# Patient Record
Sex: Female | Born: 1950
Health system: Southern US, Community
[De-identification: ages and names within clinical notes are randomized; demographics above are authoritative.]

## PROBLEM LIST (undated history)

## (undated) DIAGNOSIS — K449 Diaphragmatic hernia without obstruction or gangrene: Secondary | ICD-10-CM

## (undated) DIAGNOSIS — Z8742 Personal history of other diseases of the female genital tract: Secondary | ICD-10-CM

## (undated) DIAGNOSIS — E785 Hyperlipidemia, unspecified: Secondary | ICD-10-CM

## (undated) DIAGNOSIS — M810 Age-related osteoporosis without current pathological fracture: Secondary | ICD-10-CM

## (undated) DIAGNOSIS — Z9889 Other specified postprocedural states: Secondary | ICD-10-CM

## (undated) DIAGNOSIS — K649 Unspecified hemorrhoids: Secondary | ICD-10-CM

## (undated) DIAGNOSIS — N8110 Cystocele, unspecified: Secondary | ICD-10-CM

## (undated) DIAGNOSIS — G9332 Myalgic encephalomyelitis/chronic fatigue syndrome: Secondary | ICD-10-CM

## (undated) DIAGNOSIS — R350 Frequency of micturition: Secondary | ICD-10-CM

## (undated) DIAGNOSIS — Z973 Presence of spectacles and contact lenses: Secondary | ICD-10-CM

## (undated) DIAGNOSIS — M8580 Other specified disorders of bone density and structure, unspecified site: Secondary | ICD-10-CM

## (undated) DIAGNOSIS — R5382 Chronic fatigue, unspecified: Secondary | ICD-10-CM

## (undated) DIAGNOSIS — Z8719 Personal history of other diseases of the digestive system: Secondary | ICD-10-CM

## (undated) HISTORY — DX: Other specified postprocedural states: Z98.890

## (undated) HISTORY — DX: Age-related osteoporosis without current pathological fracture: M81.0

## (undated) HISTORY — PX: DILATION AND CURETTAGE OF UTERUS: SHX78

## (undated) HISTORY — PX: CERVICAL POLYPECTOMY: SHX88

## (undated) HISTORY — DX: Other specified disorders of bone density and structure, unspecified site: M85.80

## (undated) HISTORY — PX: UNILATERAL SALPINGECTOMY: SHX6160

## (undated) HISTORY — DX: Personal history of other diseases of the female genital tract: Z87.42

## (undated) HISTORY — DX: Chronic fatigue, unspecified: R53.82

## (undated) HISTORY — DX: Hyperlipidemia, unspecified: E78.5

## (undated) HISTORY — PX: ABDOMINAL HYSTERECTOMY: SHX81

## (undated) HISTORY — DX: Myalgic encephalomyelitis/chronic fatigue syndrome: G93.32

---

## 1989-07-25 HISTORY — PX: TOTAL ABDOMINAL HYSTERECTOMY: SHX209

## 1998-12-16 ENCOUNTER — Other Ambulatory Visit: Admission: RE | Admit: 1998-12-16 | Discharge: 1998-12-16 | Payer: Self-pay | Admitting: Family Medicine

## 2002-09-16 ENCOUNTER — Other Ambulatory Visit: Admission: RE | Admit: 2002-09-16 | Discharge: 2002-09-16 | Payer: Self-pay | Admitting: Family Medicine

## 2003-10-24 LAB — HM COLONOSCOPY: HM Colonoscopy: ABNORMAL

## 2004-07-16 ENCOUNTER — Ambulatory Visit: Payer: Self-pay | Admitting: Family Medicine

## 2004-08-04 ENCOUNTER — Ambulatory Visit: Payer: Self-pay | Admitting: Family Medicine

## 2004-08-18 ENCOUNTER — Ambulatory Visit: Payer: Self-pay | Admitting: Family Medicine

## 2004-10-05 ENCOUNTER — Ambulatory Visit: Payer: Self-pay | Admitting: Family Medicine

## 2004-10-05 ENCOUNTER — Other Ambulatory Visit: Admission: RE | Admit: 2004-10-05 | Discharge: 2004-10-05 | Payer: Self-pay | Admitting: Family Medicine

## 2004-12-01 ENCOUNTER — Ambulatory Visit: Payer: Self-pay | Admitting: Family Medicine

## 2004-12-09 ENCOUNTER — Ambulatory Visit: Payer: Self-pay | Admitting: Family Medicine

## 2005-01-05 ENCOUNTER — Ambulatory Visit: Payer: Self-pay | Admitting: Family Medicine

## 2005-07-07 ENCOUNTER — Ambulatory Visit: Payer: Self-pay | Admitting: Family Medicine

## 2005-09-05 LAB — FECAL OCCULT BLOOD, GUAIAC: Fecal Occult Blood: NEGATIVE

## 2006-01-12 ENCOUNTER — Other Ambulatory Visit: Admission: RE | Admit: 2006-01-12 | Discharge: 2006-01-12 | Payer: Self-pay | Admitting: Family Medicine

## 2006-01-12 ENCOUNTER — Encounter: Payer: Self-pay | Admitting: Family Medicine

## 2006-01-12 ENCOUNTER — Ambulatory Visit: Payer: Self-pay | Admitting: Family Medicine

## 2006-01-12 LAB — CONVERTED CEMR LAB: Pap Smear: NORMAL

## 2006-01-17 ENCOUNTER — Ambulatory Visit: Payer: Self-pay | Admitting: Family Medicine

## 2006-01-24 ENCOUNTER — Ambulatory Visit: Payer: Self-pay | Admitting: Family Medicine

## 2006-01-31 ENCOUNTER — Ambulatory Visit: Payer: Self-pay | Admitting: Family Medicine

## 2006-04-19 ENCOUNTER — Ambulatory Visit: Payer: Self-pay

## 2006-07-14 ENCOUNTER — Ambulatory Visit: Payer: Self-pay | Admitting: Family Medicine

## 2007-01-04 ENCOUNTER — Encounter: Payer: Self-pay | Admitting: Family Medicine

## 2007-01-04 DIAGNOSIS — H539 Unspecified visual disturbance: Secondary | ICD-10-CM | POA: Insufficient documentation

## 2007-01-04 DIAGNOSIS — G9332 Myalgic encephalomyelitis/chronic fatigue syndrome: Secondary | ICD-10-CM | POA: Insufficient documentation

## 2007-01-04 DIAGNOSIS — R5382 Chronic fatigue, unspecified: Secondary | ICD-10-CM

## 2007-01-04 DIAGNOSIS — N952 Postmenopausal atrophic vaginitis: Secondary | ICD-10-CM | POA: Insufficient documentation

## 2007-01-04 DIAGNOSIS — M4120 Other idiopathic scoliosis, site unspecified: Secondary | ICD-10-CM | POA: Insufficient documentation

## 2007-01-04 DIAGNOSIS — E785 Hyperlipidemia, unspecified: Secondary | ICD-10-CM | POA: Insufficient documentation

## 2007-01-09 ENCOUNTER — Ambulatory Visit: Payer: Self-pay | Admitting: Family Medicine

## 2007-01-09 DIAGNOSIS — M8580 Other specified disorders of bone density and structure, unspecified site: Secondary | ICD-10-CM

## 2007-01-09 DIAGNOSIS — M810 Age-related osteoporosis without current pathological fracture: Secondary | ICD-10-CM | POA: Insufficient documentation

## 2007-01-10 LAB — CONVERTED CEMR LAB
Albumin: 4 g/dL (ref 3.5–5.2)
Basophils Absolute: 0 10*3/uL (ref 0.0–0.1)
Bilirubin, Direct: 0.1 mg/dL (ref 0.0–0.3)
Calcium: 9.3 mg/dL (ref 8.4–10.5)
Cholesterol: 175 mg/dL (ref 0–200)
Eosinophils Absolute: 0.1 10*3/uL (ref 0.0–0.6)
Eosinophils Relative: 1.1 % (ref 0.0–5.0)
GFR calc Af Amer: 133 mL/min
GFR calc non Af Amer: 110 mL/min
Glucose, Bld: 87 mg/dL (ref 70–99)
Lymphocytes Relative: 38.6 % (ref 12.0–46.0)
MCHC: 34.2 g/dL (ref 30.0–36.0)
MCV: 91.5 fL (ref 78.0–100.0)
Neutro Abs: 2.9 10*3/uL (ref 1.4–7.7)
Neutrophils Relative %: 52.1 % (ref 43.0–77.0)
Platelets: 201 10*3/uL (ref 150–400)
RBC: 4.35 M/uL (ref 3.87–5.11)
Sodium: 143 meq/L (ref 135–145)
TSH: 1.81 microintl units/mL (ref 0.35–5.50)
Total CHOL/HDL Ratio: 3
Triglycerides: 64 mg/dL (ref 0–149)
WBC: 5.5 10*3/uL (ref 4.5–10.5)

## 2007-01-23 ENCOUNTER — Ambulatory Visit: Payer: Self-pay | Admitting: Family Medicine

## 2007-01-23 ENCOUNTER — Encounter: Payer: Self-pay | Admitting: Family Medicine

## 2007-01-29 ENCOUNTER — Encounter (INDEPENDENT_AMBULATORY_CARE_PROVIDER_SITE_OTHER): Payer: Self-pay | Admitting: *Deleted

## 2007-01-31 ENCOUNTER — Telehealth (INDEPENDENT_AMBULATORY_CARE_PROVIDER_SITE_OTHER): Payer: Self-pay | Admitting: *Deleted

## 2007-03-16 ENCOUNTER — Telehealth (INDEPENDENT_AMBULATORY_CARE_PROVIDER_SITE_OTHER): Payer: Self-pay | Admitting: *Deleted

## 2007-03-16 ENCOUNTER — Ambulatory Visit: Payer: Self-pay | Admitting: Family Medicine

## 2007-03-16 DIAGNOSIS — R319 Hematuria, unspecified: Secondary | ICD-10-CM | POA: Insufficient documentation

## 2007-03-16 LAB — CONVERTED CEMR LAB
Glucose, Urine, Semiquant: NEGATIVE
Ketones, urine, test strip: NEGATIVE
Urobilinogen, UA: 0.2
pH: 5

## 2007-03-17 ENCOUNTER — Encounter: Payer: Self-pay | Admitting: Family Medicine

## 2007-07-11 ENCOUNTER — Ambulatory Visit: Payer: Self-pay | Admitting: Family Medicine

## 2007-07-17 LAB — CONVERTED CEMR LAB
Cholesterol: 181 mg/dL (ref 0–200)
HDL: 56.7 mg/dL (ref >39.0)

## 2007-11-27 ENCOUNTER — Telehealth: Payer: Self-pay | Admitting: Family Medicine

## 2008-01-29 ENCOUNTER — Ambulatory Visit: Payer: Self-pay | Admitting: Family Medicine

## 2008-01-31 LAB — CONVERTED CEMR LAB
ALT: 26 units/L (ref 0–35)
Basophils Absolute: 0.1 10*3/uL (ref 0.0–0.1)
Basophils Relative: 1.4 % — ABNORMAL HIGH (ref 0.0–1.0)
Bilirubin, Direct: 0.1 mg/dL (ref 0.0–0.3)
CO2: 30 meq/L (ref 19–32)
Calcium: 9.3 mg/dL (ref 8.4–10.5)
Creatinine, Ser: 0.7 mg/dL (ref 0.4–1.2)
GFR calc Af Amer: 111 mL/min
Glucose, Bld: 84 mg/dL (ref 70–99)
HCT: 41.3 % (ref 36.0–46.0)
Hemoglobin: 13.8 g/dL (ref 12.0–15.0)
MCHC: 33.4 g/dL (ref 30.0–36.0)
Monocytes Absolute: 0.5 10*3/uL (ref 0.1–1.0)
Monocytes Relative: 8.6 % (ref 3.0–12.0)
Neutro Abs: 2.7 10*3/uL (ref 1.4–7.7)
RBC: 4.4 M/uL (ref 3.87–5.11)
RDW: 12.4 % (ref 11.5–14.6)
Sodium: 140 meq/L (ref 135–145)
TSH: 1.48 microintl units/mL (ref 0.35–5.50)
Total Protein: 6.7 g/dL (ref 6.0–8.3)
Vit D, 1,25-Dihydroxy: 26 — ABNORMAL LOW (ref 30–89)

## 2008-02-04 ENCOUNTER — Ambulatory Visit: Payer: Self-pay | Admitting: Family Medicine

## 2008-02-04 ENCOUNTER — Encounter: Payer: Self-pay | Admitting: Family Medicine

## 2008-02-23 LAB — HM DEXA SCAN: HM Dexa Scan: DECREASED

## 2008-03-18 ENCOUNTER — Encounter: Payer: Self-pay | Admitting: Family Medicine

## 2008-04-14 ENCOUNTER — Ambulatory Visit: Payer: Self-pay | Admitting: Family Medicine

## 2008-04-14 DIAGNOSIS — E559 Vitamin D deficiency, unspecified: Secondary | ICD-10-CM | POA: Insufficient documentation

## 2008-04-21 LAB — CONVERTED CEMR LAB: Vit D, 1,25-Dihydroxy: 67 (ref 30–89)

## 2008-08-04 ENCOUNTER — Telehealth (INDEPENDENT_AMBULATORY_CARE_PROVIDER_SITE_OTHER): Payer: Self-pay | Admitting: *Deleted

## 2008-10-08 ENCOUNTER — Ambulatory Visit: Payer: Self-pay | Admitting: Family Medicine

## 2008-10-09 LAB — CONVERTED CEMR LAB
ALT: 20 units/L (ref 0–35)
AST: 27 units/L (ref 0–37)
HDL: 58.5 mg/dL (ref >39.00)
Total CHOL/HDL Ratio: 3

## 2008-10-13 ENCOUNTER — Ambulatory Visit: Payer: Self-pay | Admitting: Family Medicine

## 2008-10-13 DIAGNOSIS — N760 Acute vaginitis: Secondary | ICD-10-CM | POA: Insufficient documentation

## 2008-10-13 LAB — CONVERTED CEMR LAB
KOH Prep: NEGATIVE
Whiff Test: NEGATIVE

## 2009-01-29 ENCOUNTER — Ambulatory Visit: Payer: Self-pay | Admitting: Family Medicine

## 2009-02-03 LAB — CONVERTED CEMR LAB
AST: 26 units/L (ref 0–37)
Albumin: 3.7 g/dL (ref 3.5–5.2)
BUN: 10 mg/dL (ref 6–23)
Basophils Absolute: 0 10*3/uL (ref 0.0–0.1)
CO2: 30 meq/L (ref 19–32)
Chloride: 107 meq/L (ref 96–112)
Cholesterol: 169 mg/dL (ref 0–200)
GFR calc non Af Amer: 91.6 mL/min (ref >60)
Glucose, Bld: 84 mg/dL (ref 70–99)
HCT: 38.7 % (ref 36.0–46.0)
Hemoglobin: 13.3 g/dL (ref 12.0–15.0)
Lymphs Abs: 2.4 10*3/uL (ref 0.7–4.0)
MCHC: 34.5 g/dL (ref 30.0–36.0)
MCV: 92.8 fL (ref 78.0–100.0)
Monocytes Absolute: 0.5 10*3/uL (ref 0.1–1.0)
Monocytes Relative: 8.2 % (ref 3.0–12.0)
Neutro Abs: 2.7 10*3/uL (ref 1.4–7.7)
Platelets: 156 10*3/uL (ref 150.0–400.0)
Potassium: 3.8 meq/L (ref 3.5–5.1)
RDW: 12.5 % (ref 11.5–14.6)
Sodium: 143 meq/L (ref 135–145)
TSH: 1.71 microintl units/mL (ref 0.35–5.50)
Vit D, 25-Hydroxy: 41 ng/mL (ref 30–89)

## 2009-02-09 ENCOUNTER — Encounter: Payer: Self-pay | Admitting: Family Medicine

## 2009-02-09 ENCOUNTER — Ambulatory Visit: Payer: Self-pay | Admitting: Family Medicine

## 2009-02-11 ENCOUNTER — Encounter (INDEPENDENT_AMBULATORY_CARE_PROVIDER_SITE_OTHER): Payer: Self-pay | Admitting: *Deleted

## 2009-03-16 ENCOUNTER — Encounter: Payer: Self-pay | Admitting: Family Medicine

## 2009-06-01 ENCOUNTER — Telehealth: Payer: Self-pay | Admitting: Family Medicine

## 2010-01-29 ENCOUNTER — Telehealth (INDEPENDENT_AMBULATORY_CARE_PROVIDER_SITE_OTHER): Payer: Self-pay | Admitting: *Deleted

## 2010-01-29 ENCOUNTER — Ambulatory Visit: Payer: Self-pay | Admitting: Family Medicine

## 2010-01-31 LAB — CONVERTED CEMR LAB
ALT: 23 units/L (ref 0–35)
Albumin: 3.9 g/dL (ref 3.5–5.2)
Basophils Relative: 0.7 % (ref 0.0–3.0)
CO2: 29 meq/L (ref 19–32)
Chloride: 110 meq/L (ref 96–112)
Eosinophils Absolute: 0.1 10*3/uL (ref 0.0–0.7)
Glucose, Bld: 80 mg/dL (ref 70–99)
Hemoglobin: 13.4 g/dL (ref 12.0–15.0)
Lymphs Abs: 2.4 10*3/uL (ref 0.7–4.0)
MCHC: 34.7 g/dL (ref 30.0–36.0)
MCV: 94.3 fL (ref 78.0–100.0)
Monocytes Absolute: 0.5 10*3/uL (ref 0.1–1.0)
Neutro Abs: 2.9 10*3/uL (ref 1.4–7.7)
RBC: 4.09 M/uL (ref 3.87–5.11)
Sodium: 145 meq/L (ref 135–145)
Total CHOL/HDL Ratio: 3
Total Protein: 6.3 g/dL (ref 6.0–8.3)
Triglycerides: 60 mg/dL (ref 0.0–149.0)
Vit D, 25-Hydroxy: 51 ng/mL (ref 30–89)

## 2010-02-05 ENCOUNTER — Ambulatory Visit: Payer: Self-pay | Admitting: Family Medicine

## 2010-02-18 ENCOUNTER — Encounter: Payer: Self-pay | Admitting: Family Medicine

## 2010-02-18 ENCOUNTER — Ambulatory Visit: Payer: Self-pay | Admitting: Family Medicine

## 2010-02-18 LAB — HM MAMMOGRAPHY: HM Mammogram: NORMAL

## 2010-02-22 ENCOUNTER — Encounter: Payer: Self-pay | Admitting: Family Medicine

## 2010-02-22 ENCOUNTER — Encounter (INDEPENDENT_AMBULATORY_CARE_PROVIDER_SITE_OTHER): Payer: Self-pay | Admitting: *Deleted

## 2010-04-02 ENCOUNTER — Encounter: Payer: Self-pay | Admitting: Family Medicine

## 2010-05-03 ENCOUNTER — Telehealth: Payer: Self-pay | Admitting: Family Medicine

## 2010-08-12 ENCOUNTER — Ambulatory Visit
Admission: RE | Admit: 2010-08-12 | Discharge: 2010-08-12 | Payer: Self-pay | Source: Home / Self Care | Attending: Family Medicine | Admitting: Family Medicine

## 2010-08-12 DIAGNOSIS — J029 Acute pharyngitis, unspecified: Secondary | ICD-10-CM | POA: Insufficient documentation

## 2010-08-12 LAB — CONVERTED CEMR LAB: Rapid Strep: NEGATIVE

## 2010-08-26 NOTE — Miscellaneous (Signed)
  Clinical Lists Changes  Observations: Added new observation of MAMMO DUE: 02/19/2011 (02/18/2010 12:50) Added new observation of MAMMOGRAM: Normal (02/18/2010 12:50)

## 2010-08-26 NOTE — Progress Notes (Signed)
Summary: please review dexa results  Phone Note Call from Patient Call back at Home Phone [phone]   Caller: Patient Call For: Judith Part MD Summary of Call: Please review bone density report and advise. Initial call taken by: Lowella Petties CMA,  May 03, 2010 10:25 AM  Follow-up for Phone Call        she still has osteopenia - is stable at spine and mildly improved at her femoral neck(hip)  recommendation is to continue with current tx and exercise and re check in 2 years Follow-up by: Judith Part MD,  May 03, 2010 1:39 PM  Additional Follow-up for Phone Call Additional follow up Details #1::        Patient notified as instructed by telephone. Lewanda Rife LPN  May 03, 2010 5:05 PM

## 2010-08-26 NOTE — Assessment & Plan Note (Signed)
Summary: CPX/MK   Vital Signs:  Patient profile:   60 year old female Height:      63.25 inches Weight:      121.25 pounds BMI:     21.39 Temp:     97.8 degrees F oral Pulse rate:   64 / minute Pulse rhythm:   regular BP sitting:   100 / 64  (left arm) Cuff size:   regular  Vitals Entered By: Lewanda Rife LPN (February 05, 2010 9:35 AM) CC: CPX LMP Hyst 2007   History of Present Illness: here for health mt exam and to rev chronic health probls  is doing good overall- no new complaints    lipids are good with HDL 58 and LDL 106- fairly stable- on zocor not walking as much  is taking care of her grandchild -- does push a stroller   wt and bp are stable   Td 08  dexa was 09 osteopenia - 2 y due next mo ca and D- taking it compliantly  evista  pap 07- had hyst for bleeding in past  7/10 mam  self exam - no lumps or changes  is eating a healthy diet     other labs ok   Allergies: 1)  Motrin 2)  Amoxicillin 3)  Fosamax 4)  * Ivp Dye  Past History:  Past Medical History: Last updated: 2008/02/21 Hyperlipidemia osteopenia chronic fatigue syndrome  Past Surgical History: Last updated: 03/28/2008 Hysterectomy- 1 ovary removed, bleeding Dexa- osteopenia (12/1997),, worse (12/1998) MRI LS- normal (01/1993) Cervical polypectomy Dexa improved- increased BMD 5.7 % LS, 1-8% hip (03/2001) Colonoscopy- int hemorrhoids (10/2003) EGD- HH, duodenopathy Urology w/u- hematuria, pos stone (08/2004) Pelvic US- neg (09/2004) Dexa- decreased BMD, osteopenia (02/2006) dexa- decreased bmd in hip, slt inc in spine (8/09)  Family History: Last updated: 2008/02/21 Father: Barbaraann Share, HTN- deceased Mother: kidney removed, CVA Siblings: 2 brothers, 1 sister brothers times 2 cholesterol sister cholesterol MGM intestinal ca PGM- cancer ? 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 Eyes:  Denies blurring and eye irritation. CV:  Denies chest pain or discomfort, palpitations, shortness of breath with exertion, and swelling of feet. Resp:  Denies cough, shortness of breath, sputum productive, and wheezing. GI:  Denies abdominal pain, change in bowel habits, indigestion, and nausea. GU:  Denies dysuria and urinary frequency. MS:  Denies joint pain, joint redness, joint swelling, and muscle aches. Derm:  Denies itching, poor wound healing, and rash. Neuro:  Denies tingling. Psych:  Denies anxiety and depression. Endo:  Denies cold intolerance, excessive thirst, excessive urination, and heat intolerance. Heme:  Denies abnormal bruising and bleeding.  Physical Exam  General:  Well-developed,well-nourished,in no acute distress; alert,appropriate and cooperative throughout examination Head:  normocephalic, atraumatic, and no abnormalities observed.   Eyes:  vision grossly intact, pupils equal, pupils round, and pupils reactive to light.  no conjunctival pallor, injection or icterus  Nose:  no nasal discharge.   Mouth:  pharynx pink and moist.   Neck:  supple with full rom and no masses or thyromegally, no JVD or carotid bruit  Chest Wall:  No deformities, masses, or tenderness noted. Breasts:  No mass, nodules, thickening, tenderness, bulging, retraction, inflamation, nipple discharge or skin changes noted.   Lungs:  Normal respiratory effort, chest expands symmetrically. Lungs are clear to auscultation, no crackles or wheezes. Heart:  Normal rate and regular rhythm. S1 and S2 normal without gallop, murmur, click, rub or other extra sounds. Abdomen:  Bowel sounds positive,abdomen soft and non-tender without  masses, organomegaly or hernias noted. no renal bruits  Msk:  No deformity or scoliosis noted of thoracic or lumbar spine.  no acute joint changes  Pulses:  R and L carotid,radial,femoral,dorsalis pedis and posterior tibial pulses are full and equal bilaterally Extremities:  No clubbing, cyanosis, edema, or deformity noted with normal full range of motion of all joints.   Neurologic:  sensation intact to light touch, gait normal, and DTRs symmetrical and normal.   Skin:  Intact without suspicious lesions or rashes some lentigos Cervical Nodes:  No lymphadenopathy noted Axillary Nodes:  No palpable lymphadenopathy Inguinal Nodes:  No significant adenopathy Psych:  normal affect, talkative and pleasant    Impression & Recommendations:  Problem # 1:  HEALTH MAINTENANCE EXAM (ICD-V70.0) Assessment Comment Only  reviewed health habits including diet, exercise and skin cancer prevention reviewed health maintenance list and family history  rev labs in detail enc good health habits   Orders: Prescription Created Electronically [phone])  Problem # 2:  NEOPLASM, MALIGNANT, COLON, FAMILY HX (ICD-V16.0)  is up to date on colonosc   Orders: Prescription Created Electronically [id number])  Problem # 3:  UNSPECIFIED VITAMIN D DEFICIENCY (ICD-268.9) Assessment: Improved  this is back in normal range with regular dosing - reviewed that   Orders: Prescription Created Electronically [phone])  Problem # 4:  OTHER SCREENING MAMMOGRAM (ICD-V76.12) Assessment: Comment Only annual mammogram scheduled adv pt to continue regular self breast exams non remarkable breast exam today  Orders: Radiology Referral (Radiology) Prescription Created Electronically [phone])  Problem # 5:  HYPERLIPIDEMIA (ICD-272.4) Assessment: Unchanged  lipids continue to be well controlled on statin and good diet rev labs today rev low sat fat diet  Her updated medication list for this problem includes:    Zocor 40 Mg  Tabs (Simvastatin) .Marland Kitchen... 1 by mouth once daily  Labs Reviewed: SGOT: 30 (01/29/2010)   SGPT: 23 (01/29/2010)   HDL:58.70 (01/29/2010), 53.60 (01/29/2009)  LDL:106 (01/29/2010), 96 (30/86/5784)  Chol:177 (01/29/2010), 169 (01/29/2009)  Trig:60.0 (01/29/2010), 95.0 (01/29/2009)  Orders: Prescription Created Electronically [phone])  Complete Medication List: 1)  Caltrate 600 Tabs (Calcium carbonate tabs) .... Take two by mouth daily 2)  Centrum Tabs (Multiple vitamins-minerals) .... Take as directed daily 3)  Vitamin D 2000 Unit Tabs (Cholecalciferol) .... Take 1 tablet by mouth once a day 4)  Evista 60 Mg Tabs (Raloxifene hcl) .Marland Kitchen.. 1 by mouth once daily 5)  Zocor 40 Mg Tabs (Simvastatin) .Marland Kitchen.. 1 by mouth once daily  Patient Instructions: 1)  labs look good 2)  keep up the healthy diet and exercise  3)  we will schedule dexa and mammogram at check out  Prescriptions: ZOCOR 40 MG TABS (SIMVASTATIN) 1 by mouth once daily  #30 x 11   Entered and Authorized by:   Judith Part MD   Signed by:   Judith Part MD on 02/05/2010   Method used:   Electronically to        [facility] (retail)       [address]  [place], [place]  [address]       Ph: [phone]       Fax: [phone]   RxID:   [id number] EVISTA 60 MG TABS (RALOXIFENE HCL) 1 by mouth once daily  #30 x 11   Entered and Authorized by:   Judith Part MD   Signed by:   Judith Part MD on 02/05/2010   Method used:   Electronically to        [facility] (retail)       [address]       [place], [place]  [address]       Ph: [phone]       Fax: [phone]   RxID:   [id number]   Current Allergies (reviewed today): MOTRIN AMOXICILLIN FOSAMAX * IVP DYE

## 2010-08-26 NOTE — Letter (Signed)
Summary: Results Follow up Letter  [place] [facility]  [address] [place], [place] [address]   Phone: [phone]  Fax: [phone]    02/22/2010 MRN: [id number]    Tracy [name] [address] [place], [place]  [address]    Dear Ms. Shatzer,  The following are the results of your recent test(s):  Test         Result    Pap Smear:        Normal _____  Not Normal _____ Comments: ______________________________________________________ Cholesterol: LDL(Bad cholesterol):         Your goal is less than:         HDL (Good cholesterol):       Your goal is more than: Comments:  ______________________________________________________ Mammogram:        Normal __X___  Not Normal _____ Comments:  Yearly follow up is recommended.   ___________________________________________________________________ Hemoccult:        Normal _____  Not normal _______ Comments:    _____________________________________________________________________ Other Tests:    We routinely do not discuss normal results over the telephone.  If you desire a copy of the results, or you have any questions about this information we can discuss them at your next office visit.   Sincerely,    Marne A. Milinda Antis, M.D.  MAT:lsf

## 2010-08-26 NOTE — Progress Notes (Signed)
----   Converted from flag ---- ---- 01/28/2010 3:56 PM, Colon Flattery Tower MD wrote: please check wellness/ lipid and vit d for v70.0 and vit D def   ---- 01/28/2010 12:24 PM, Mills Koller wrote: Hello,  Pt having cpx labs tomorrow, need order and dx ,Please. Terri ------------------------------

## 2010-08-26 NOTE — Assessment & Plan Note (Signed)
Summary: SORE THROAT/ lb   Vital Signs:  Patient profile:   60 year old female Height:      63.25 inches Weight:      121.25 pounds BMI:     21.39 Temp:     98.7 degrees F oral Pulse rate:   82 / minute Pulse rhythm:   regular BP sitting:   102 / 70  (left arm) Cuff size:   regular  Vitals Entered By: Linde Gillis CMA Duncan Dull) (August 12, 2010 10:51 AM) CC: sore throat, vomiting, loose stool   History of Present Illness: 60 yo here for sore throat, vomiting.  Woke up yesterday with very sore throat. No runny nose, ear pressure.  Did have some phelgm in her throat and when she coughed it up she vomitted some mucousy emesis. No recurrent epidsodes of vomiting. Stools were just a little loose this morning.  Felt feverish last night. Tmax this morning was 99.6  Takes care of her 29 month old grandaughter who did have an ear infection this week, on abx.  No rash that she is aware of. Not currently nauseated.   Current Medications (verified): 1)  Caltrate 600   Tabs (Calcium Carbonate Tabs) .... Take Two By Mouth Daily 2)  Centrum   Tabs (Multiple Vitamins-Minerals) .... Take As Directed Daily 3)  Vitamin D 2000 Unit Tabs (Cholecalciferol) .... Take 1 Tablet By Mouth Once A Day 4)  Evista 60 Mg Tabs (Raloxifene Hcl) .Marland Kitchen.. 1 By Mouth Once Daily 5)  Zocor 40 Mg Tabs (Simvastatin) .Marland Kitchen.. 1 By Mouth Once Daily  Allergies: 1)  Motrin 2)  Amoxicillin 3)  Fosamax 4)  * Ivp Dye  Past History:  Past Medical History: Last updated: 02/17/08 Hyperlipidemia osteopenia chronic fatigue syndrome  Past Surgical History: Last updated: 03/28/2008 Hysterectomy- 1 ovary removed, bleeding Dexa- osteopenia (12/1997),, worse (12/1998) MRI LS- normal (01/1993) Cervical polypectomy Dexa improved- increased BMD 5.7 % LS, 1-8% hip (03/2001) Colonoscopy- int hemorrhoids (10/2003) EGD- HH, duodenopathy Urology w/u- hematuria, pos stone (08/2004) Pelvic US- neg (09/2004) Dexa- decreased  BMD, osteopenia (02/2006) dexa- decreased bmd in hip, slt inc in spine (8/09)  Family History: Last updated: 2008/02/17 Father: Barbaraann Share, HTN- deceased Mother: kidney removed, CVA Siblings: 2 brothers, 1 sister brothers times 2 cholesterol sister cholesterol MGM intestinal ca PGM- cancer ? 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 ENT:  Complains of sore throat; denies difficulty swallowing, earache, nasal congestion, and sinus pressure. CV:  Denies chest pain or discomfort. Resp:  Complains of cough; denies shortness of breath and wheezing. GI:  Complains of nausea and vomiting.  Physical Exam  General:  Well-developed,well-nourished,in no acute distress; alert,appropriate and cooperative throughout examination VSS Ears:  R ear normal and L ear normal.   Nose:  no nasal discharge.   Mouth:  pharyngeal erythema.   no exudate.pharyngeal erythema.   Neck:  supple with full rom and no masses or thyromegally, no JVD or carotid bruit  Lungs:  Normal respiratory effort, chest expands symmetrically. Lungs are clear to auscultation, no crackles or wheezes. Heart:  Normal rate and regular rhythm. S1 and S2 normal without gallop,  murmur, click, rub or other extra sounds. Abdomen:  Bowel sounds positive,abdomen soft and non-tender without masses, organomegaly or hernias noted. no renal bruits  Cervical Nodes:  shoddy enlarged, non tender lymph nodes. Psych:  normal affect, talkative and pleasant    Impression & Recommendations:  Problem # 1:  ACUTE PHARYNGITIS (ICD-462) Assessment New Rapid strep negative and symptoms seem most consistent  with viral pharyngitis. Advised supportive care- push fluids, use Ibupfrofen /tylenol for comfort and fever. Vomitting was likely due to sputum as her symptoms have resolved. Orders: Rapid Strep (21308)  Complete Medication List: 1)  Caltrate 600 Tabs (Calcium carbonate tabs) .... Take two by mouth daily 2)  Centrum Tabs (Multiple vitamins-minerals) .... Take as directed daily 3)  Vitamin D 2000 Unit Tabs (Cholecalciferol) .... Take 1 tablet by mouth once a day 4)  Evista 60 Mg Tabs (Raloxifene hcl) .Marland Kitchen.. 1 by mouth once daily 5)  Zocor 40 Mg Tabs (Simvastatin) .Marland Kitchen.. 1 by mouth once daily   Orders Added: 1)  Rapid Strep [[address]] 2)  Est. Patient Level III [[address]]    Current Allergies (reviewed today): MOTRIN AMOXICILLIN FOSAMAX * IVP DYE Laboratory Results    Other Tests  Rapid Strep: negative  Kit Test Internal QC: Positive   (Normal Range: Negative)

## 2011-01-29 ENCOUNTER — Encounter: Payer: Self-pay | Admitting: Family Medicine

## 2011-02-01 ENCOUNTER — Other Ambulatory Visit (INDEPENDENT_AMBULATORY_CARE_PROVIDER_SITE_OTHER): Payer: BC Managed Care – PPO | Admitting: Family Medicine

## 2011-02-01 ENCOUNTER — Telehealth: Payer: Self-pay | Admitting: Family Medicine

## 2011-02-01 DIAGNOSIS — E559 Vitamin D deficiency, unspecified: Secondary | ICD-10-CM

## 2011-02-01 DIAGNOSIS — E785 Hyperlipidemia, unspecified: Secondary | ICD-10-CM

## 2011-02-01 DIAGNOSIS — Z0000 Encounter for general adult medical examination without abnormal findings: Secondary | ICD-10-CM

## 2011-02-01 LAB — COMPREHENSIVE METABOLIC PANEL
ALT: 21 U/L (ref 0–35)
AST: 29 U/L (ref 0–37)
Albumin: 4 g/dL (ref 3.5–5.2)
BUN: 14 mg/dL (ref 6–23)
CO2: 29 mEq/L (ref 19–32)
Calcium: 8.9 mg/dL (ref 8.4–10.5)
Chloride: 110 mEq/L (ref 96–112)
GFR: 99.09 mL/min (ref >60.00)
Potassium: 4.2 mEq/L (ref 3.5–5.1)

## 2011-02-01 LAB — CBC WITH DIFFERENTIAL/PLATELET
Basophils Relative: 0.6 % (ref 0.0–3.0)
Eosinophils Absolute: 0.2 10*3/uL (ref 0.0–0.7)
Lymphocytes Relative: 40.3 % (ref 12.0–46.0)
MCHC: 34 g/dL (ref 30.0–36.0)
Monocytes Relative: 8.8 % (ref 3.0–12.0)
Neutrophils Relative %: 47 % (ref 43.0–77.0)
RBC: 4.24 Mil/uL (ref 3.87–5.11)
WBC: 5.5 10*3/uL (ref 4.5–10.5)

## 2011-02-01 LAB — TSH: TSH: 2.84 u[IU]/mL (ref 0.35–5.50)

## 2011-02-01 LAB — LIPID PANEL
Total CHOL/HDL Ratio: 3
Triglycerides: 78 mg/dL (ref 0.0–149.0)

## 2011-02-01 NOTE — Telephone Encounter (Signed)
Message copied by Judy Pimple on Tue Feb 01, 2011  8:26 AM ------      Message from: Alvina Chou      Created: Mon Jan 31, 2011 10:58 AM       Patient is scheduled for CPX labs, please order future labs, Thanks , Camelia Eng

## 2011-02-03 ENCOUNTER — Other Ambulatory Visit: Payer: Self-pay | Admitting: *Deleted

## 2011-02-03 MED ORDER — RALOXIFENE HCL 60 MG PO TABS: 60.0000 mg | ORAL_TABLET | Freq: Every day | ORAL | Status: DC

## 2011-02-03 MED ORDER — SIMVASTATIN 40 MG PO TABS: 40.0000 mg | ORAL_TABLET | Freq: Every day | ORAL | Status: DC

## 2011-02-07 ENCOUNTER — Encounter: Payer: Self-pay | Admitting: Family Medicine

## 2011-02-07 ENCOUNTER — Ambulatory Visit (INDEPENDENT_AMBULATORY_CARE_PROVIDER_SITE_OTHER): Payer: BC Managed Care – PPO | Admitting: Family Medicine

## 2011-02-07 DIAGNOSIS — M949 Disorder of cartilage, unspecified: Secondary | ICD-10-CM

## 2011-02-07 DIAGNOSIS — E785 Hyperlipidemia, unspecified: Secondary | ICD-10-CM

## 2011-02-07 DIAGNOSIS — Z1231 Encounter for screening mammogram for malignant neoplasm of breast: Secondary | ICD-10-CM

## 2011-02-07 DIAGNOSIS — M899 Disorder of bone, unspecified: Secondary | ICD-10-CM

## 2011-02-07 DIAGNOSIS — E559 Vitamin D deficiency, unspecified: Secondary | ICD-10-CM

## 2011-02-07 DIAGNOSIS — Z0000 Encounter for general adult medical examination without abnormal findings: Secondary | ICD-10-CM

## 2011-02-07 MED ORDER — SIMVASTATIN 40 MG PO TABS: 40.0000 mg | ORAL_TABLET | Freq: Every day | ORAL | Status: DC

## 2011-02-07 MED ORDER — RALOXIFENE HCL 60 MG PO TABS: 60.0000 mg | ORAL_TABLET | Freq: Every day | ORAL | Status: DC

## 2011-02-07 NOTE — Progress Notes (Signed)
Subjective:    Patient ID: Tracy [name], female    DOB: 1950-10-11, 60 y.o.   MRN: [id number]  HPI Here for health mt exam and to rev chronic med problems Wt is stable  Is doing well - overall  No new medical problems   No pap due to hyst for bleeding No gyn issues or symptoms    Mam 7/11 normal  Wants to set that up at [facility]  Self exams- no lumps or changes   colonosc 4.05 hemm Told her 10 years  gmother colon cancer   Td 08 dexa 9/11 osteopenia with FN T score of -2.1 On evista--has been on that 1-2 years - doing well on that  Good vit D level at 64   Lipids great with zocor and diet LDL 91 Lab Results  Component Value Date   CHOL 172 02/01/2011   CHOL 177 01/29/2010   CHOL 169 01/29/2009   Lab Results  Component Value Date   HDL 65.80 02/01/2011   HDL 01.02 01/29/2010   HDL 53.60 01/29/2009   Lab Results  Component Value Date   LDLCALC 91 02/01/2011   LDLCALC 106* 01/29/2010   LDLCALC 96 01/29/2009   Lab Results  Component Value Date   TRIG 78.0 02/01/2011   TRIG 60.0 01/29/2010   TRIG 95.0 01/29/2009   Lab Results  Component Value Date   CHOLHDL 3 02/01/2011   CHOLHDL 3 01/29/2010   CHOLHDL 3 01/29/2009   No results found for this basename: LDLDIRECT   Plans to stay on it - zocor and diet   Patient Active Problem List  Diagnoses  . UNSPECIFIED VITAMIN D DEFICIENCY  . HYPERLIPIDEMIA  . VAGINITIS, ATROPHIC  . OSTEOPENIA  . SCOLIOSIS  . CHRONIC FATIGUE SYNDROME  . Routine general medical examination at a health care facility  . Other screening mammogram   Past Medical History  Diagnosis Date  . Hyperlipidemia   . Osteopenia   . Chronic fatigue syndrome   . S/P cervical polypectomy    Past Surgical History  Procedure Date  . Abdominal hysterectomy     one ovary removed/ bleeding   History  Substance Use Topics  . Smoking status: Never Smoker   . Smokeless tobacco: Not on file  . Alcohol Use: No   Family History  Problem Relation  Age of Onset  . Stroke Mother   . Hypertension Father   . Hyperlipidemia Sister   . Hyperlipidemia Brother   . Cancer Paternal Uncle   . Cancer Maternal Grandmother     intestinal CA  . Cancer Paternal Grandfather     CA unsure which kind  . Hyperlipidemia Brother    Allergies  Allergen Reactions  . Alendronate Sodium     REACTION: GI  . Amoxicillin     REACTION: vomiting  . Ibuprofen     REACTION: rash   Current Outpatient Prescriptions on File Prior to Visit  Medication Sig Dispense Refill  . Calcium Carbonate (CALTRATE 600 PO) Take 2 tablets by mouth daily.        . Cholecalciferol (VITAMIN D) 2000 UNITS CAPS Take 1 capsule by mouth daily.        . Multiple Vitamin (MULTIVITAMIN) tablet Take 1 tablet by mouth daily.              Review of Systems Review of Systems  Constitutional: Negative for fever, appetite change, fatigue and unexpected weight change.  Eyes: Negative for pain and  visual disturbance.  Respiratory: Negative for cough and shortness of breath.   Cardiovascular: Negative.  for cp or palpitations Gastrointestinal: Negative for nausea, diarrhea and constipation.  Genitourinary: Negative for urgency and frequency.  Skin: Negative for pallor. or rash  Neurological: Negative for weakness, light-headedness, numbness and headaches.  Hematological: Negative for adenopathy. Does not bruise/bleed easily.  Psychiatric/Behavioral: Negative for dysphoric mood. The patient is not nervous/anxious.         Objective:   Physical Exam  Constitutional: She appears well-developed and well-nourished. No distress.  HENT:  Head: Normocephalic and atraumatic.  Right Ear: External ear normal.  Left Ear: External ear normal.  Nose: Nose normal.  Mouth/Throat: Oropharynx is clear and moist.  Eyes: Conjunctivae and EOM are normal. Pupils are equal, round, and reactive to light.  Neck: Normal range of motion. Neck supple. No JVD present. Carotid bruit is not present. No  thyromegaly present.  Cardiovascular: Normal rate, regular rhythm, normal heart sounds and intact distal pulses.   Pulmonary/Chest: Effort normal and breath sounds normal. No respiratory distress. She has no wheezes.  Abdominal: Soft. Bowel sounds are normal. She exhibits no distension. There is no tenderness.  Genitourinary: No breast swelling, tenderness, discharge or bleeding.  Musculoskeletal: Normal range of motion. She exhibits no edema and no tenderness.  Lymphadenopathy:    She has no cervical adenopathy.  Neurological: She is alert. She has normal reflexes. Coordination normal.  Skin: Skin is warm and dry. No rash noted. No erythema. No pallor.  Psychiatric: She has a normal mood and affect.          Assessment & Plan:

## 2011-02-07 NOTE — Assessment & Plan Note (Signed)
Very good control with zocor 40 and diet  No problems-so will continue this dose for now Disc low sat fat diet

## 2011-02-07 NOTE — Assessment & Plan Note (Signed)
Mam sched Nl breast exam today Reminded to do self exams also

## 2011-02-07 NOTE — Assessment & Plan Note (Signed)
Reviewed health habits including diet and exercise and skin cancer prevention Also reviewed health mt list, fam hx and immunizations  Rev wellness labs with pt in detail

## 2011-02-07 NOTE — Patient Instructions (Signed)
We will schedule mammogram at check out  Keep up the good work with healthy diet and exercise  No change in medicines

## 2011-02-07 NOTE — Assessment & Plan Note (Signed)
Good level Will continue current vit D dose and exercise

## 2011-02-07 NOTE — Assessment & Plan Note (Signed)
On evista 1-2 years now and doing well No fx Good vit D level  Continue ca and D Also exercise Disc safety

## 2011-02-23 ENCOUNTER — Ambulatory Visit: Payer: Self-pay | Admitting: Family Medicine

## 2011-02-24 ENCOUNTER — Encounter: Payer: Self-pay | Admitting: Family Medicine

## 2011-06-22 ENCOUNTER — Telehealth: Payer: Self-pay | Admitting: *Deleted

## 2011-06-22 NOTE — Telephone Encounter (Signed)
I would recommend allegra or claritin for runny nose, mucinex for congestion and tylenol (acetaminophen) for fever Lots of fluids F/u if not improving

## 2011-06-22 NOTE — Telephone Encounter (Signed)
Patient notified as instructed by telephone. Pt will call back if does not improve.

## 2011-06-22 NOTE — Telephone Encounter (Signed)
Pt c/o low grade fever, and congestion, wants to know what otc meds you would advise her take.

## 2012-02-03 ENCOUNTER — Telehealth (INDEPENDENT_AMBULATORY_CARE_PROVIDER_SITE_OTHER): Payer: BC Managed Care – PPO | Admitting: Family Medicine

## 2012-02-03 ENCOUNTER — Other Ambulatory Visit (INDEPENDENT_AMBULATORY_CARE_PROVIDER_SITE_OTHER): Payer: BC Managed Care – PPO

## 2012-02-03 DIAGNOSIS — E785 Hyperlipidemia, unspecified: Secondary | ICD-10-CM

## 2012-02-03 DIAGNOSIS — Z0000 Encounter for general adult medical examination without abnormal findings: Secondary | ICD-10-CM

## 2012-02-03 DIAGNOSIS — E559 Vitamin D deficiency, unspecified: Secondary | ICD-10-CM

## 2012-02-03 DIAGNOSIS — M899 Disorder of bone, unspecified: Secondary | ICD-10-CM

## 2012-02-03 LAB — CBC WITH DIFFERENTIAL/PLATELET
Basophils Relative: 0.8 % (ref 0.0–3.0)
Eosinophils Absolute: 0.1 10*3/uL (ref 0.0–0.7)
MCHC: 33 g/dL (ref 30.0–36.0)
MCV: 94.7 fl (ref 78.0–100.0)
Monocytes Absolute: 0.6 10*3/uL (ref 0.1–1.0)
Neutrophils Relative %: 46.3 % (ref 43.0–77.0)
RBC: 4.22 Mil/uL (ref 3.87–5.11)

## 2012-02-03 LAB — COMPREHENSIVE METABOLIC PANEL
AST: 28 U/L (ref 0–37)
Albumin: 3.8 g/dL (ref 3.5–5.2)
Alkaline Phosphatase: 39 U/L (ref 39–117)
BUN: 11 mg/dL (ref 6–23)
Creatinine, Ser: 0.6 mg/dL (ref 0.4–1.2)
Potassium: 3.9 mEq/L (ref 3.5–5.1)

## 2012-02-03 LAB — LIPID PANEL
Cholesterol: 169 mg/dL (ref 0–200)
HDL: 66.3 mg/dL (ref >39.00)
VLDL: 16.8 mg/dL (ref 0.0–40.0)

## 2012-02-03 NOTE — Telephone Encounter (Signed)
Message copied by Judy Pimple on Fri Feb 03, 2012  7:50 AM ------      Message from: Alvina Chou      Created: Tue Jan 31, 2012 12:56 PM      Regarding: Labs for Friday, 7.12.13       Patient is scheduled for CPX labs, please order future labs, Thanks , Tracy [name]

## 2012-02-07 ENCOUNTER — Other Ambulatory Visit: Payer: BC Managed Care – PPO

## 2012-02-14 ENCOUNTER — Encounter: Payer: Self-pay | Admitting: Family Medicine

## 2012-02-14 ENCOUNTER — Ambulatory Visit (INDEPENDENT_AMBULATORY_CARE_PROVIDER_SITE_OTHER): Payer: BC Managed Care – PPO | Admitting: Family Medicine

## 2012-02-14 VITALS — BP 109/61 | HR 70 | Temp 98.1°F | Ht 63.5 in | Wt 113.2 lb

## 2012-02-14 DIAGNOSIS — E559 Vitamin D deficiency, unspecified: Secondary | ICD-10-CM

## 2012-02-14 DIAGNOSIS — Z2911 Encounter for prophylactic immunotherapy for respiratory syncytial virus (RSV): Secondary | ICD-10-CM

## 2012-02-14 DIAGNOSIS — Z0000 Encounter for general adult medical examination without abnormal findings: Secondary | ICD-10-CM

## 2012-02-14 DIAGNOSIS — M949 Disorder of cartilage, unspecified: Secondary | ICD-10-CM

## 2012-02-14 DIAGNOSIS — E785 Hyperlipidemia, unspecified: Secondary | ICD-10-CM

## 2012-02-14 DIAGNOSIS — Z1231 Encounter for screening mammogram for malignant neoplasm of breast: Secondary | ICD-10-CM

## 2012-02-14 DIAGNOSIS — M899 Disorder of bone, unspecified: Secondary | ICD-10-CM

## 2012-02-14 MED ORDER — SIMVASTATIN 40 MG PO TABS: 40.0000 mg | ORAL_TABLET | Freq: Every day | ORAL | Status: DC

## 2012-02-14 MED ORDER — RALOXIFENE HCL 60 MG PO TABS: 60.0000 mg | ORAL_TABLET | Freq: Every day | ORAL | Status: DC

## 2012-02-14 NOTE — Assessment & Plan Note (Signed)
Scheduled annual screening mammogram Nl breast exam today  Encouraged monthly self exams   

## 2012-02-14 NOTE — Patient Instructions (Addendum)
We will do ref for mammogram at check out Call us in sept to schedule dexa / bone density test Zoster vaccine today  Continue taking good care of yourself and eat more protein

## 2012-02-14 NOTE — Assessment & Plan Note (Signed)
Good level in 60s on current suppl Disc imp to bone and overall health

## 2012-02-14 NOTE — Assessment & Plan Note (Signed)
On evista 4 y Intol bisphosphenate  Good exercise  Vit D good  dexa due in oct- will call 1 mo prior - does at Dr Judie Petit office

## 2012-02-14 NOTE — Assessment & Plan Note (Signed)
Good lipids Commended- zocor and diet Disc goals for lipids and reasons to control them Rev labs with pt Rev low sat fat diet in detail

## 2012-02-14 NOTE — Progress Notes (Signed)
Subjective:    Patient ID: Tracy [name], female    DOB: [birth year], 61 y.o.   MRN: [id number]  HPI Here for health maintenance exam and to review chronic medical problems   Is doing very well  Very busy summer   Nothing new going on   Had hyst Nl pap in 07 No gyn problems   mammo 8/12-needs to schedule that - [facility] in [place] Self exam-no lumps or changes   Zoster status- is interested in shingles vaccine Thinks her insurance covers it  She wants this today   Flu shot- got it in the fall   colonosc 4/05 Recall 10 years - was fine   bp good 109/61  Wt is down 7 lb with bmi of 19 husb is now a DM - completely changed eating and now she walks every day  husb lost 30 lb  Knows she needs more protein   Labs are ok   Vit D is 65 on current suppl  Lab Results  Component Value Date   CHOL 169 02/03/2012   CHOL 172 02/01/2011   CHOL 177 01/29/2010   Lab Results  Component Value Date   HDL 66.30 02/03/2012   HDL 40.98 02/01/2011   HDL 11.91 01/29/2010   Lab Results  Component Value Date   LDLCALC 86 02/03/2012   LDLCALC 91 02/01/2011   LDLCALC 106* 01/29/2010   Lab Results  Component Value Date   TRIG 84.0 02/03/2012   TRIG 78.0 02/01/2011   TRIG 60.0 01/29/2010   Lab Results  Component Value Date   CHOLHDL 3 02/03/2012   CHOLHDL 3 02/01/2011   CHOLHDL 3 01/29/2010   No results found for this basename: LDLDIRECT   good profile, little improved On zocor   Osteopenia dexa 9/11- due in oct  On evista-on that for 4 years (will take one more year ) - dr Ferdinand Lango office - will be due in the fall  Non tol fosamax Ca adn d--very good  D level great   Patient Active Problem List  Diagnosis  . UNSPECIFIED VITAMIN D DEFICIENCY  . HYPERLIPIDEMIA  . VAGINITIS, ATROPHIC  . OSTEOPENIA  . SCOLIOSIS  . CHRONIC FATIGUE SYNDROME  . Routine general medical examination at a health care facility  . Other screening mammogram   Past Medical History  Diagnosis Date  .  Hyperlipidemia   . Osteopenia   . Chronic fatigue syndrome   . S/P cervical polypectomy    Past Surgical History  Procedure Date  . Abdominal hysterectomy     one ovary removed/ bleeding   History  Substance Use Topics  . Smoking status: Never Smoker   . Smokeless tobacco: Not on file  . Alcohol Use: No   Family History  Problem Relation Age of Onset  . Stroke Mother   . Hypertension Father   . Hyperlipidemia Sister   . Hyperlipidemia Brother   . Cancer Paternal Uncle   . Cancer Maternal Grandmother     intestinal CA  . Cancer Paternal Grandfather     CA unsure which kind  . Hyperlipidemia Brother    Allergies  Allergen Reactions  . Alendronate Sodium     REACTION: GI  . Amoxicillin     REACTION: vomiting  . Ibuprofen     REACTION: rash   Current Outpatient Prescriptions on File Prior to Visit  Medication Sig Dispense Refill  . Calcium Carbonate (CALTRATE 600 PO) Take 2 tablets by mouth daily.        Marland Kitchen  Cholecalciferol (VITAMIN D) 2000 UNITS CAPS Take 1 capsule by mouth daily.        . Multiple Vitamin (MULTIVITAMIN) tablet Take 1 tablet by mouth daily.        . raloxifene (EVISTA) 60 MG tablet Take 1 tablet (60 mg total) by mouth daily.  30 tablet  11  . simvastatin (ZOCOR) 40 MG tablet Take 1 tablet (40 mg total) by mouth daily.  30 tablet  11      Review of Systems Review of Systems  Constitutional: Negative for fever, appetite change, fatigue and unexpected weight change.  Eyes: Negative for pain and visual disturbance.  Respiratory: Negative for cough and shortness of breath.   Cardiovascular: Negative for cp or palpitations    Gastrointestinal: Negative for nausea, diarrhea and constipation.  Genitourinary: Negative for urgency and frequency.  Skin: Negative for pallor or rash   Neurological: Negative for weakness, light-headedness, numbness and headaches.  Hematological: Negative for adenopathy. Does not bruise/bleed easily.  Psychiatric/Behavioral:  Negative for dysphoric mood. The patient is not nervous/anxious.         Objective:   Physical Exam  Constitutional: She appears well-developed and well-nourished. No distress.  HENT:  Head: Normocephalic and atraumatic.  Right Ear: External ear normal.  Left Ear: External ear normal.  Nose: Nose normal.  Mouth/Throat: Oropharynx is clear and moist.  Eyes: Conjunctivae and EOM are normal. Pupils are equal, round, and reactive to light. No scleral icterus.  Neck: Normal range of motion. Neck supple. No JVD present. No thyromegaly present.  Cardiovascular: Normal rate, regular rhythm, normal heart sounds and intact distal pulses.  Exam reveals no gallop.   Pulmonary/Chest: Effort normal and breath sounds normal. No respiratory distress. She has no wheezes.  Abdominal: Soft. Bowel sounds are normal. She exhibits no distension and no mass. There is no tenderness.  Genitourinary: No breast swelling, tenderness, discharge or bleeding.       Breast exam: No mass, nodules, thickening, tenderness, bulging, retraction, inflamation, nipple discharge or skin changes noted.  No axillary or clavicular LA.  Chaperoned exam.    Musculoskeletal: Normal range of motion. She exhibits no edema and no tenderness.  Lymphadenopathy:    She has no cervical adenopathy.  Neurological: She is alert. She has normal reflexes. No cranial nerve deficit. She exhibits normal muscle tone. Coordination normal.  Skin: Skin is warm and dry. No rash noted. No erythema. No pallor.  Psychiatric: She has a normal mood and affect.          Assessment & Plan:

## 2012-02-14 NOTE — Assessment & Plan Note (Signed)
Reviewed health habits including diet and exercise and skin cancer prevention Also reviewed health mt list, fam hx and immunizations  Wellness labs rev Zoster vaccine today

## 2012-02-24 ENCOUNTER — Ambulatory Visit: Payer: Self-pay | Admitting: Family Medicine

## 2012-02-27 ENCOUNTER — Encounter: Payer: Self-pay | Admitting: Family Medicine

## 2012-02-28 ENCOUNTER — Encounter: Payer: Self-pay | Admitting: *Deleted

## 2012-02-28 ENCOUNTER — Encounter: Payer: Self-pay | Admitting: Family Medicine

## 2012-04-03 ENCOUNTER — Telehealth: Payer: Self-pay

## 2012-04-03 DIAGNOSIS — M899 Disorder of bone, unspecified: Secondary | ICD-10-CM

## 2012-04-03 DIAGNOSIS — M949 Disorder of cartilage, unspecified: Secondary | ICD-10-CM

## 2012-04-03 NOTE — Telephone Encounter (Signed)
Pt request bone density test to be scheduled.Please advise.

## 2012-04-03 NOTE — Telephone Encounter (Signed)
Will refer for that  

## 2012-04-24 ENCOUNTER — Encounter: Payer: Self-pay | Admitting: Family Medicine

## 2012-04-24 ENCOUNTER — Encounter: Payer: Self-pay | Admitting: *Deleted

## 2012-04-24 ENCOUNTER — Telehealth: Payer: Self-pay | Admitting: *Deleted

## 2012-04-24 NOTE — Telephone Encounter (Signed)
evista works more for bone quality than density- ie: it still decreases fracture risk  I would like her to take it for one more year and then disc further management from there  If she is having significant side effects, however- please tell me what they are, thanks

## 2012-04-24 NOTE — Telephone Encounter (Signed)
Called pt and notified her of her Dexa scan results and to continue current med. Pt wanted to know if she should keep taking the Evista since her hip was worse, advise pt to schedule a follow up appt if she wants to discuss med changes but she declined and just wanted me to ask Dr. Karie Schwalbe if she should keep taking the Evista since it had side effects  Copy mailed to pt, updated quick abstract, sent results to be scaned

## 2012-04-25 NOTE — Telephone Encounter (Signed)
Advise pt of what Dr. Karie Schwalbe recommends, pt is ok with taking the Evista she hasn't had any bad side effects she just read what some of the side effects were and she was nervous about taking it

## 2012-04-25 NOTE — Telephone Encounter (Signed)
Since she is not having symptoms- I advise she continue it - but let me know if any problems, and of course it is her decision whether to take it or not -- if she is very fearful... It is not worth taking the med of course

## 2013-02-07 ENCOUNTER — Telehealth: Payer: Self-pay | Admitting: Family Medicine

## 2013-02-07 DIAGNOSIS — E559 Vitamin D deficiency, unspecified: Secondary | ICD-10-CM

## 2013-02-07 DIAGNOSIS — M899 Disorder of bone, unspecified: Secondary | ICD-10-CM

## 2013-02-07 DIAGNOSIS — Z0000 Encounter for general adult medical examination without abnormal findings: Secondary | ICD-10-CM

## 2013-02-07 DIAGNOSIS — E785 Hyperlipidemia, unspecified: Secondary | ICD-10-CM

## 2013-02-07 NOTE — Telephone Encounter (Signed)
Message copied by Judy Pimple on Thu Feb 07, 2013  7:37 PM ------      Message from: Alvina Chou      Created: Mon Feb 04, 2013 11:29 AM      Regarding: Lab orders for Friday, 7.18.14       Patient is scheduled for CPX labs, please order future labs, Thanks , Terri       ------

## 2013-02-08 ENCOUNTER — Other Ambulatory Visit (INDEPENDENT_AMBULATORY_CARE_PROVIDER_SITE_OTHER): Payer: BC Managed Care – PPO

## 2013-02-08 DIAGNOSIS — R5382 Chronic fatigue, unspecified: Secondary | ICD-10-CM

## 2013-02-08 DIAGNOSIS — E559 Vitamin D deficiency, unspecified: Secondary | ICD-10-CM

## 2013-02-08 DIAGNOSIS — G9332 Myalgic encephalomyelitis/chronic fatigue syndrome: Secondary | ICD-10-CM

## 2013-02-08 DIAGNOSIS — E785 Hyperlipidemia, unspecified: Secondary | ICD-10-CM

## 2013-02-08 DIAGNOSIS — Z0000 Encounter for general adult medical examination without abnormal findings: Secondary | ICD-10-CM

## 2013-02-08 DIAGNOSIS — M899 Disorder of bone, unspecified: Secondary | ICD-10-CM

## 2013-02-08 LAB — COMPREHENSIVE METABOLIC PANEL
ALT: 23 U/L (ref 0–35)
CO2: 30 mEq/L (ref 19–32)
Calcium: 9.1 mg/dL (ref 8.4–10.5)
Chloride: 108 mEq/L (ref 96–112)
GFR: 84.74 mL/min (ref >60.00)
Sodium: 142 mEq/L (ref 135–145)
Total Protein: 6.5 g/dL (ref 6.0–8.3)

## 2013-02-08 LAB — CBC WITH DIFFERENTIAL/PLATELET
Basophils Absolute: 0 10*3/uL (ref 0.0–0.1)
Hemoglobin: 13.6 g/dL (ref 12.0–15.0)
Lymphocytes Relative: 45.4 % (ref 12.0–46.0)
Monocytes Relative: 7.3 % (ref 3.0–12.0)
Neutro Abs: 2.5 10*3/uL (ref 1.4–7.7)
RDW: 13.4 % (ref 11.5–14.6)
WBC: 5.6 10*3/uL (ref 4.5–10.5)

## 2013-02-08 LAB — TSH: TSH: 2.11 u[IU]/mL (ref 0.35–5.50)

## 2013-02-09 LAB — VITAMIN D 25 HYDROXY (VIT D DEFICIENCY, FRACTURES): Vit D, 25-Hydroxy: 59 ng/mL (ref 30–89)

## 2013-02-15 ENCOUNTER — Ambulatory Visit (INDEPENDENT_AMBULATORY_CARE_PROVIDER_SITE_OTHER): Payer: BC Managed Care – PPO | Admitting: Family Medicine

## 2013-02-15 ENCOUNTER — Encounter: Payer: Self-pay | Admitting: Family Medicine

## 2013-02-15 VITALS — BP 102/60 | HR 59 | Temp 98.3°F | Ht 62.75 in | Wt 115.5 lb

## 2013-02-15 DIAGNOSIS — Z0000 Encounter for general adult medical examination without abnormal findings: Secondary | ICD-10-CM

## 2013-02-15 DIAGNOSIS — E785 Hyperlipidemia, unspecified: Secondary | ICD-10-CM

## 2013-02-15 DIAGNOSIS — M949 Disorder of cartilage, unspecified: Secondary | ICD-10-CM

## 2013-02-15 DIAGNOSIS — M899 Disorder of bone, unspecified: Secondary | ICD-10-CM

## 2013-02-15 DIAGNOSIS — E559 Vitamin D deficiency, unspecified: Secondary | ICD-10-CM

## 2013-02-15 NOTE — Patient Instructions (Addendum)
Stop evista after this px is done -you are done with that  Stop lipitor for 3 months Schedule fasting labs for 3 months  Continue your calcium and D and exercise

## 2013-02-15 NOTE — Progress Notes (Signed)
Subjective:    Patient ID: Tracy [name], female    DOB: [birth year], 62 y.o.   MRN: [id number]  HPI Here for health maintenance exam and to review chronic medical problems    Having a busy summer -taking care of her grandchildren Feels very good   Wt is up 2 lb with bmi of 20  Pap 6/07 S/p hysterectomy for bleeding No gyn symptoms at all   Flu vaccine - had it this fall  Zoster vaccine 7/13 Td 6/08  mammo 8/13- due next month - will make own appt -send her reminder  Normal Self exam - no lumps or changes   colonosc 4/05 nl - 10 year recall   dexa 9/13 - at Dr Caroline More office  Osteopenia On evista- no problems - has taken for almost 5 years (will stop it) D level is 59  Lab Results  Component Value Date   CHOL 169 02/08/2013   CHOL 169 02/03/2012   CHOL 172 02/01/2011   Lab Results  Component Value Date   HDL 65.90 02/08/2013   HDL 40.98 02/03/2012   HDL 11.91 02/01/2011   Lab Results  Component Value Date   LDLCALC 93 02/08/2013   LDLCALC 86 02/03/2012   LDLCALC 91 02/01/2011   Lab Results  Component Value Date   TRIG 53.0 02/08/2013   TRIG 84.0 02/03/2012   TRIG 78.0 02/01/2011   Lab Results  Component Value Date   CHOLHDL 3 02/08/2013   CHOLHDL 3 02/03/2012   CHOLHDL 3 02/01/2011   No results found for this basename: LDLDIRECT   good numbers Wants to come off of the lipitor - because she is eating better and exercising  When she went on it thought she was doing good with habits but realized not  Wants to try going off of it for 3 months   Patient Active Problem List   Diagnosis Date Noted  . Other screening mammogram 02/07/2011  . Routine general medical examination at a health care facility 02/01/2011  . UNSPECIFIED VITAMIN D DEFICIENCY 04/14/2008  . OSTEOPENIA 01/09/2007  . HYPERLIPIDEMIA 01/04/2007  . VAGINITIS, ATROPHIC 01/04/2007  . SCOLIOSIS 01/04/2007  . CHRONIC FATIGUE SYNDROME 01/04/2007   Past Medical History  Diagnosis Date  .  Hyperlipidemia   . Osteopenia   . Chronic fatigue syndrome   . S/P cervical polypectomy    Past Surgical History  Procedure Laterality Date  . Abdominal hysterectomy      one ovary removed/ bleeding   History  Substance Use Topics  . Smoking status: Never Smoker   . Smokeless tobacco: Not on file  . Alcohol Use: No   Family History  Problem Relation Age of Onset  . Stroke Mother   . Hypertension Father   . Hyperlipidemia Sister   . Hyperlipidemia Brother   . Cancer Paternal Uncle   . Cancer Maternal Grandmother     intestinal CA  . Cancer Paternal Grandfather     CA unsure which kind  . Hyperlipidemia Brother    Allergies  Allergen Reactions  . Alendronate Sodium     REACTION: GI  . Amoxicillin     REACTION: vomiting  . Ibuprofen     REACTION: rash   Current Outpatient Prescriptions on File Prior to Visit  Medication Sig Dispense Refill  . Calcium Carbonate (CALTRATE 600 PO) Take 2 tablets by mouth daily.        . Cholecalciferol (VITAMIN D) 2000 UNITS CAPS Take 1 capsule by mouth daily.        Marland Kitchen  raloxifene (EVISTA) 60 MG tablet Take 1 tablet (60 mg total) by mouth daily.  30 tablet  11  . simvastatin (ZOCOR) 40 MG tablet Take 1 tablet (40 mg total) by mouth daily.  30 tablet  11   No current facility-administered medications on file prior to visit.     Patient Active Problem List   Diagnosis Date Noted  . Other screening mammogram 02/07/2011  . Routine general medical examination at a health care facility 02/01/2011  . UNSPECIFIED VITAMIN D DEFICIENCY 04/14/2008  . OSTEOPENIA 01/09/2007  . HYPERLIPIDEMIA 01/04/2007  . VAGINITIS, ATROPHIC 01/04/2007  . SCOLIOSIS 01/04/2007  . CHRONIC FATIGUE SYNDROME 01/04/2007   Past Medical History  Diagnosis Date  . Hyperlipidemia   . Osteopenia   . Chronic fatigue syndrome   . S/P cervical polypectomy    Past Surgical History  Procedure Laterality Date  . Abdominal hysterectomy      one ovary removed/ bleeding    History  Substance Use Topics  . Smoking status: Never Smoker   . Smokeless tobacco: Not on file  . Alcohol Use: No   Family History  Problem Relation Age of Onset  . Stroke Mother   . Hypertension Father   . Hyperlipidemia Sister   . Hyperlipidemia Brother   . Cancer Paternal Uncle   . Cancer Maternal Grandmother     intestinal CA  . Cancer Paternal Grandfather     CA unsure which kind  . Hyperlipidemia Brother    Allergies  Allergen Reactions  . Alendronate Sodium     REACTION: GI  . Amoxicillin     REACTION: vomiting  . Ibuprofen     REACTION: rash   Current Outpatient Prescriptions on File Prior to Visit  Medication Sig Dispense Refill  . Calcium Carbonate (CALTRATE 600 PO) Take 2 tablets by mouth daily.        . Cholecalciferol (VITAMIN D) 2000 UNITS CAPS Take 1 capsule by mouth daily.        . simvastatin (ZOCOR) 40 MG tablet Take 1 tablet (40 mg total) by mouth daily.  30 tablet  11   No current facility-administered medications on file prior to visit.    Review of Systems Review of Systems  Constitutional: Negative for fever, appetite change, fatigue and unexpected weight change.  Eyes: Negative for pain and visual disturbance.  Respiratory: Negative for cough and shortness of breath.   Cardiovascular: Negative for cp or palpitations    Gastrointestinal: Negative for nausea, diarrhea and constipation.  Genitourinary: Negative for urgency and frequency.  Skin: Negative for pallor or rash   Neurological: Negative for weakness, light-headedness, numbness and headaches.  Hematological: Negative for adenopathy. Does not bruise/bleed easily.  Psychiatric/Behavioral: Negative for dysphoric mood. The patient is not nervous/anxious.         Objective:   Physical Exam  Constitutional: She appears well-developed and well-nourished. No distress.  HENT:  Head: Normocephalic and atraumatic.  Right Ear: External ear normal.  Left Ear: External ear normal.  Nose:  Nose normal.  Mouth/Throat: Oropharynx is clear and moist.  Eyes: Conjunctivae and EOM are normal. Pupils are equal, round, and reactive to light. Right eye exhibits no discharge. Left eye exhibits no discharge. No scleral icterus.  Neck: Normal range of motion. Neck supple. No JVD present. Carotid bruit is not present. No thyromegaly present.  Cardiovascular: Normal rate, regular rhythm and intact distal pulses.  Exam reveals no gallop.   Pulmonary/Chest: Effort normal and breath sounds normal. No respiratory  distress. She has no wheezes. She has no rales.  Abdominal: Soft. Bowel sounds are normal. She exhibits no distension, no abdominal bruit and no mass. There is no tenderness.  Genitourinary: No breast swelling, tenderness, discharge or bleeding.  Breast exam: No mass, nodules, thickening, tenderness, bulging, retraction, inflamation, nipple discharge or skin changes noted.  No axillary or clavicular LA.  Chaperoned exam.    Musculoskeletal: She exhibits no edema and no tenderness.  Lymphadenopathy:    She has no cervical adenopathy.  Neurological: She is alert. She has normal reflexes. No cranial nerve deficit. She exhibits normal muscle tone. Coordination normal.  Skin: Skin is warm and dry. No rash noted. No erythema. No pallor.  Psychiatric: She has a normal mood and affect.          Assessment & Plan:

## 2013-02-17 NOTE — Assessment & Plan Note (Signed)
Reviewed health habits including diet and exercise and skin cancer prevention Also reviewed health mt list, fam hx and immunizations   Wellness labs reviewed  

## 2013-02-17 NOTE — Assessment & Plan Note (Signed)
Disc goals for lipids and reasons to control them Rev labs with pt Rev low sat fat diet in detail Pt wants a trial off her statin with very good diet for 3 mo  Lab planned for 3 mo

## 2013-02-17 NOTE — Assessment & Plan Note (Signed)
Pt has completed 5 years of evista and will stop it now  Continue to follow dexa every 2 y No fx or falls

## 2013-02-17 NOTE — Assessment & Plan Note (Signed)
Level ok with current supplementation Will continue that  Disc imp to bone and overall health

## 2013-02-27 ENCOUNTER — Ambulatory Visit: Payer: Self-pay | Admitting: Family Medicine

## 2013-02-28 ENCOUNTER — Encounter: Payer: Self-pay | Admitting: Family Medicine

## 2013-04-12 ENCOUNTER — Ambulatory Visit: Payer: Self-pay | Admitting: Internal Medicine

## 2013-05-20 ENCOUNTER — Other Ambulatory Visit (INDEPENDENT_AMBULATORY_CARE_PROVIDER_SITE_OTHER): Payer: BC Managed Care – PPO

## 2013-05-20 DIAGNOSIS — E785 Hyperlipidemia, unspecified: Secondary | ICD-10-CM

## 2013-05-20 LAB — LIPID PANEL
Cholesterol: 253 mg/dL — ABNORMAL HIGH (ref 0–200)
VLDL: 20.6 mg/dL (ref 0.0–40.0)

## 2013-05-29 ENCOUNTER — Encounter: Payer: Self-pay | Admitting: Family Medicine

## 2013-05-30 ENCOUNTER — Other Ambulatory Visit: Payer: Self-pay

## 2013-05-30 MED ORDER — SIMVASTATIN 40 MG PO TABS: 40.0000 mg | ORAL_TABLET | Freq: Every day | ORAL | Status: DC

## 2013-07-25 HISTORY — PX: COLONOSCOPY: SHX174

## 2014-02-09 ENCOUNTER — Telehealth: Payer: Self-pay | Admitting: Family Medicine

## 2014-02-09 DIAGNOSIS — Z0000 Encounter for general adult medical examination without abnormal findings: Secondary | ICD-10-CM

## 2014-02-09 DIAGNOSIS — E559 Vitamin D deficiency, unspecified: Secondary | ICD-10-CM

## 2014-02-09 DIAGNOSIS — M949 Disorder of cartilage, unspecified: Secondary | ICD-10-CM

## 2014-02-09 DIAGNOSIS — M899 Disorder of bone, unspecified: Secondary | ICD-10-CM

## 2014-02-09 NOTE — Telephone Encounter (Signed)
Message copied by Abner Greenspan on Sun Feb 09, 2014 12:05 PM ------      Message from: Selinda Orion J      Created: Thu Feb 06, 2014 11:40 AM      Regarding: Lab orders for Monday, 7.20.15       Patient is scheduled for CPX labs, please order future labs, Thanks , Terri       ------

## 2014-02-10 ENCOUNTER — Other Ambulatory Visit (INDEPENDENT_AMBULATORY_CARE_PROVIDER_SITE_OTHER): Payer: BC Managed Care – PPO

## 2014-02-10 DIAGNOSIS — E559 Vitamin D deficiency, unspecified: Secondary | ICD-10-CM

## 2014-02-10 DIAGNOSIS — M899 Disorder of bone, unspecified: Secondary | ICD-10-CM

## 2014-02-10 DIAGNOSIS — M949 Disorder of cartilage, unspecified: Secondary | ICD-10-CM

## 2014-02-10 DIAGNOSIS — Z0000 Encounter for general adult medical examination without abnormal findings: Secondary | ICD-10-CM

## 2014-02-10 LAB — CBC WITH DIFFERENTIAL/PLATELET
BASOS ABS: 0 10*3/uL (ref 0.0–0.1)
Basophils Relative: 0.4 % (ref 0.0–3.0)
Eosinophils Absolute: 0.1 10*3/uL (ref 0.0–0.7)
Eosinophils Relative: 1.5 % (ref 0.0–5.0)
HCT: 41.1 % (ref 36.0–46.0)
Hemoglobin: 13.7 g/dL (ref 12.0–15.0)
LYMPHS ABS: 2.3 10*3/uL (ref 0.7–4.0)
LYMPHS PCT: 44 % (ref 12.0–46.0)
MCHC: 33.3 g/dL (ref 30.0–36.0)
MCV: 92.9 fl (ref 78.0–100.0)
MONOS PCT: 10.4 % (ref 3.0–12.0)
Monocytes Absolute: 0.5 10*3/uL (ref 0.1–1.0)
Neutro Abs: 2.2 10*3/uL (ref 1.4–7.7)
Neutrophils Relative %: 43.7 % (ref 43.0–77.0)
PLATELETS: 174 10*3/uL (ref 150.0–400.0)
RBC: 4.43 Mil/uL (ref 3.87–5.11)
RDW: 13.9 % (ref 11.5–15.5)
WBC: 5.1 10*3/uL (ref 4.0–10.5)

## 2014-02-10 LAB — TSH: TSH: 1.82 u[IU]/mL (ref 0.35–4.50)

## 2014-02-10 LAB — VITAMIN D 25 HYDROXY (VIT D DEFICIENCY, FRACTURES): VITD: 58.2 ng/mL

## 2014-02-10 LAB — COMPREHENSIVE METABOLIC PANEL
ALT: 22 U/L (ref 0–35)
AST: 26 U/L (ref 0–37)
Albumin: 4 g/dL (ref 3.5–5.2)
Alkaline Phosphatase: 41 U/L (ref 39–117)
BILIRUBIN TOTAL: 0.6 mg/dL (ref 0.2–1.2)
BUN: 10 mg/dL (ref 6–23)
CHLORIDE: 108 meq/L (ref 96–112)
CO2: 29 meq/L (ref 19–32)
Calcium: 9.1 mg/dL (ref 8.4–10.5)
Creatinine, Ser: 0.7 mg/dL (ref 0.4–1.2)
GFR: 90.05 mL/min (ref >60.00)
Glucose, Bld: 79 mg/dL (ref 70–99)
Potassium: 4.1 mEq/L (ref 3.5–5.1)
SODIUM: 142 meq/L (ref 135–145)
TOTAL PROTEIN: 6.6 g/dL (ref 6.0–8.3)

## 2014-02-10 LAB — LIPID PANEL
CHOL/HDL RATIO: 3
Cholesterol: 170 mg/dL (ref 0–200)
HDL: 67.8 mg/dL (ref >39.00)
LDL Cholesterol: 85 mg/dL (ref 0–99)
NONHDL: 102.2
Triglycerides: 88 mg/dL (ref 0.0–149.0)
VLDL: 17.6 mg/dL (ref 0.0–40.0)

## 2014-02-17 ENCOUNTER — Encounter: Payer: Self-pay | Admitting: Family Medicine

## 2014-02-17 ENCOUNTER — Ambulatory Visit (INDEPENDENT_AMBULATORY_CARE_PROVIDER_SITE_OTHER): Payer: BC Managed Care – PPO | Admitting: Family Medicine

## 2014-02-17 VITALS — BP 116/64 | HR 58 | Temp 98.4°F | Ht 63.0 in | Wt 118.5 lb

## 2014-02-17 DIAGNOSIS — Z0000 Encounter for general adult medical examination without abnormal findings: Secondary | ICD-10-CM

## 2014-02-17 DIAGNOSIS — Z1211 Encounter for screening for malignant neoplasm of colon: Secondary | ICD-10-CM

## 2014-02-17 DIAGNOSIS — M949 Disorder of cartilage, unspecified: Secondary | ICD-10-CM

## 2014-02-17 DIAGNOSIS — M899 Disorder of bone, unspecified: Secondary | ICD-10-CM

## 2014-02-17 DIAGNOSIS — E559 Vitamin D deficiency, unspecified: Secondary | ICD-10-CM

## 2014-02-17 DIAGNOSIS — E785 Hyperlipidemia, unspecified: Secondary | ICD-10-CM

## 2014-02-17 MED ORDER — SIMVASTATIN 40 MG PO TABS: 40.0000 mg | ORAL_TABLET | Freq: Every day | ORAL | Status: DC

## 2014-02-17 NOTE — Progress Notes (Signed)
Pre visit review using our clinic review tool, if applicable. No additional management support is needed unless otherwise documented below in the visit note. 

## 2014-02-17 NOTE — Assessment & Plan Note (Signed)
Vitamin D level is therapeutic with current supplementation Disc importance of this to bone and overall health  

## 2014-02-17 NOTE — Assessment & Plan Note (Signed)
Disc goals for lipids and reasons to control them Rev labs with pt Rev low sat fat diet in detail Great control with simvastatin and diet

## 2014-02-17 NOTE — Assessment & Plan Note (Signed)
Ref for 10 year recall colonoscopy

## 2014-02-17 NOTE — Patient Instructions (Signed)
Don't forget to make your mammogram appt  Stop at check out for referral for colonoscopy  Call us in Oct to schedule your bone density test   Take care of yourself

## 2014-02-17 NOTE — Assessment & Plan Note (Signed)
Reviewed health habits including diet and exercise and skin cancer prevention Reviewed appropriate screening tests for age  Also reviewed health mt list, fam hx and immunization status , as well as social and family history   Labs rev  See HPI sched her recall colonosc

## 2014-02-17 NOTE — Progress Notes (Signed)
Subjective:    Patient ID: Tracy [name], female    DOB: 07-21-1951, 63 y.o.   MRN: [id number]  HPI Here for health maintenance exam and to review chronic medical problems    Feeling really good - is happy about that   Wt is up 3 lb with bmi of 21 Very good bmi   S/p hyst Nl pap 07 No problems at all gyn wise  No hx of cervical cancer   Mammogram 8/14 nl- [facility] in [place]-has the letter and will schedule her own Self exam-no lumps   colonosc 4/05 Is due for recall - she went ahead and called about it [facility] Dr Tiffany Kocher)  Flu vaccine 10/14  Td 6/08 Zoster vax 7/13  Osteopenia  9/13 dexa dec at hip Finished 5 y of evista  No falls or fractures    Hyperlipidemia zocor and diet  Lab Results  Component Value Date   CHOL 170 02/10/2014   CHOL 253* 05/20/2013   CHOL 169 02/08/2013   Lab Results  Component Value Date   HDL 67.80 02/10/2014   HDL 65.50 05/20/2013   HDL 65.90 02/08/2013   Lab Results  Component Value Date   LDLCALC 85 02/10/2014   LDLCALC 93 02/08/2013   LDLCALC 86 02/03/2012   Lab Results  Component Value Date   TRIG 88.0 02/10/2014   TRIG 103.0 05/20/2013   TRIG 53.0 02/08/2013   Lab Results  Component Value Date   CHOLHDL 3 02/10/2014   CHOLHDL 4 05/20/2013   CHOLHDL 3 02/08/2013   Lab Results  Component Value Date   LDLDIRECT 172.6 05/20/2013   very good control  Eats a healthy diet also  In general tries to exercise - none for 2 weeks due to caring for grandchildren    Patient Active Problem List   Diagnosis Date Noted  . Other screening mammogram 02/07/2011  . Routine general medical examination at a health care facility 02/01/2011  . UNSPECIFIED VITAMIN D DEFICIENCY 04/14/2008  . OSTEOPENIA 01/09/2007  . HYPERLIPIDEMIA 01/04/2007  . VAGINITIS, ATROPHIC 01/04/2007  . SCOLIOSIS 01/04/2007  . CHRONIC FATIGUE SYNDROME 01/04/2007   Past Medical History  Diagnosis Date  . Hyperlipidemia   . Osteopenia   . Chronic  fatigue syndrome   . S/P cervical polypectomy    Past Surgical History  Procedure Laterality Date  . Abdominal hysterectomy      one ovary removed/ bleeding   History  Substance Use Topics  . Smoking status: Never Smoker   . Smokeless tobacco: Not on file  . Alcohol Use: No   Family History  Problem Relation Age of Onset  . Stroke Mother   . Hypertension Father   . Hyperlipidemia Sister   . Hyperlipidemia Brother   . Cancer Paternal Uncle   . Cancer Maternal Grandmother     intestinal CA  . Cancer Paternal Grandfather     CA unsure which kind  . Hyperlipidemia Brother    Allergies  Allergen Reactions  . Alendronate Sodium     REACTION: GI  . Amoxicillin     REACTION: vomiting  . Ibuprofen     REACTION: rash   Current Outpatient Prescriptions on File Prior to Visit  Medication Sig Dispense Refill  . Calcium Carbonate (CALTRATE 600 PO) Take 2 tablets by mouth daily.        . Cholecalciferol (VITAMIN D) 2000 UNITS CAPS Take 1 capsule by mouth daily.        . simvastatin (  ZOCOR) 40 MG tablet Take 1 tablet (40 mg total) by mouth at bedtime.  30 tablet  11   No current facility-administered medications on file prior to visit.     Review of Systems Review of Systems  Constitutional: Negative for fever, appetite change, fatigue and unexpected weight change.  Eyes: Negative for pain and visual disturbance.  Respiratory: Negative for cough and shortness of breath.   Cardiovascular: Negative for cp or palpitations    Gastrointestinal: Negative for nausea, diarrhea and constipation.  Genitourinary: Negative for urgency and frequency.  Skin: Negative for pallor or rash   Neurological: Negative for weakness, light-headedness, numbness and headaches.  Hematological: Negative for adenopathy. Does not bruise/bleed easily.  Psychiatric/Behavioral: Negative for dysphoric mood. The patient is not nervous/anxious.         Objective:   Physical Exam  Constitutional: She  appears well-developed and well-nourished. No distress.  HENT:  Head: Normocephalic and atraumatic.  Right Ear: External ear normal.  Left Ear: External ear normal.  Mouth/Throat: Oropharynx is clear and moist.  Eyes: Conjunctivae and EOM are normal. Pupils are equal, round, and reactive to light. No scleral icterus.  Neck: Normal range of motion. Neck supple. No JVD present. Carotid bruit is not present. No thyromegaly present.  Cardiovascular: Normal rate, regular rhythm, normal heart sounds and intact distal pulses.  Exam reveals no gallop.   Pulmonary/Chest: Effort normal and breath sounds normal. No respiratory distress. She has no wheezes. She exhibits no tenderness.  Abdominal: Soft. Bowel sounds are normal. She exhibits no distension, no abdominal bruit and no mass. There is no tenderness.  Genitourinary: No breast swelling, tenderness, discharge or bleeding.  Breast exam: No mass, nodules, thickening, tenderness, bulging, retraction, inflamation, nipple discharge or skin changes noted.  No axillary or clavicular LA.      Musculoskeletal: Normal range of motion. She exhibits no edema and no tenderness.  Mild kyphosis   Lymphadenopathy:    She has no cervical adenopathy.  Neurological: She is alert. She has normal reflexes. No cranial nerve deficit. She exhibits normal muscle tone. Coordination normal.  Skin: Skin is warm and dry. No rash noted. No erythema. No pallor.  Psychiatric: She has a normal mood and affect.          Assessment & Plan:   Problem List Items Addressed This Visit     Musculoskeletal and Integument   OSTEOPENIA     Has had 86 y of evista and intol to bisphosphenates No falls or fx Disc need for calcium/ vitamin D/ wt bearing exercise and bone density test every 2 y to monitor Disc safety/ fracture risk in detail   She will call in OCT to sched her dexa       Other   UNSPECIFIED VITAMIN D DEFICIENCY     Vitamin D level is therapeutic with current  supplementation Disc importance of this to bone and overall health      HYPERLIPIDEMIA     Disc goals for lipids and reasons to control them Rev labs with pt Rev low sat fat diet in detail Great control with simvastatin and diet     Relevant Medications      simvastatin (ZOCOR) tablet   Routine general medical examination at a health care facility - Primary     Reviewed health habits including diet and exercise and skin cancer prevention Reviewed appropriate screening tests for age  Also reviewed health mt list, fam hx and immunization status , as well as social  and family history   Labs rev  See HPI sched her recall colonosc     Colon cancer screening     Ref for 10 year recall colonoscopy      Relevant Orders      Ambulatory referral to Gastroenterology

## 2014-02-17 NOTE — Assessment & Plan Note (Signed)
Has had 5 y of evista and intol to bisphosphenates No falls or fx Disc need for calcium/ vitamin D/ wt bearing exercise and bone density test every 2 y to monitor Disc safety/ fracture risk in detail   She will call in OCT to sched her dexa

## 2014-03-11 ENCOUNTER — Ambulatory Visit: Payer: Self-pay | Admitting: Family Medicine

## 2014-03-11 ENCOUNTER — Encounter: Payer: Self-pay | Admitting: Family Medicine

## 2014-04-11 ENCOUNTER — Ambulatory Visit: Payer: Self-pay | Admitting: Unknown Physician Specialty

## 2014-04-17 ENCOUNTER — Encounter: Payer: Self-pay | Admitting: Family Medicine

## 2014-05-05 ENCOUNTER — Telehealth: Payer: Self-pay | Admitting: Family Medicine

## 2014-05-05 DIAGNOSIS — M899 Disorder of bone, unspecified: Secondary | ICD-10-CM

## 2014-05-05 DIAGNOSIS — M949 Disorder of cartilage, unspecified: Principal | ICD-10-CM

## 2014-05-05 NOTE — Telephone Encounter (Signed)
Order for dexa

## 2014-05-05 NOTE — Telephone Encounter (Signed)
Pt is due for her bone density. She is scheduled at [facility] office 05/21/14 @ 9:20 am. Please put in order for bone density.

## 2014-05-12 ENCOUNTER — Encounter: Payer: Self-pay | Admitting: Family Medicine

## 2014-05-21 ENCOUNTER — Ambulatory Visit: Payer: Self-pay | Admitting: Family Medicine

## 2014-05-21 ENCOUNTER — Encounter: Payer: Self-pay | Admitting: Family Medicine

## 2014-09-29 ENCOUNTER — Ambulatory Visit (INDEPENDENT_AMBULATORY_CARE_PROVIDER_SITE_OTHER): Payer: BLUE CROSS/BLUE SHIELD | Admitting: Family Medicine

## 2014-09-29 ENCOUNTER — Encounter: Payer: Self-pay | Admitting: Family Medicine

## 2014-09-29 VITALS — BP 116/74 | HR 65 | Temp 98.0°F | Ht 63.0 in | Wt 121.4 lb

## 2014-09-29 DIAGNOSIS — R42 Dizziness and giddiness: Secondary | ICD-10-CM

## 2014-09-29 DIAGNOSIS — H9202 Otalgia, left ear: Secondary | ICD-10-CM | POA: Insufficient documentation

## 2014-09-29 DIAGNOSIS — R079 Chest pain, unspecified: Secondary | ICD-10-CM

## 2014-09-29 NOTE — Progress Notes (Signed)
Pre visit review using our clinic review tool, if applicable. No additional management support is needed unless otherwise documented below in the visit note. 

## 2014-09-29 NOTE — Assessment & Plan Note (Signed)
Atypical but still warrants eval  Not high risk pt  Is under much stress - caring for toddler full time  EKG - some T wave inversions in precordial leads Ref to cardiol for further eval and poss stress testing If worse-will update or go to ER

## 2014-09-29 NOTE — Patient Instructions (Signed)
Stop at check out for referral to cardiology If your chest pain or pressure suddenly worsens or changes -please let us know /go to ER if after hours  I think the ear pain and dizziness were related - if it returns (? Vertigo from inner ear pressure) Take care of yourself  Try to get some rest

## 2014-09-29 NOTE — Assessment & Plan Note (Signed)
Now resolved Nl exam Suspect vertigo rel to her ear problem Will update if symptoms return

## 2014-09-29 NOTE — Progress Notes (Signed)
Subjective:    Patient ID: Tracy [name], female    DOB: [birth year], 64 y.o.   MRN: [id number]  HPI For the last 2 weeks she has had some episodes of lightheadedness - gets better after lying down on the couch That got better   Then some spells of L ear pain - sharp, no drainage  Thinks hearing is a little muffled  No nasal congestion    Also some episodes of tightness of chest- rad to R breast  Once happened in the middle of the night - 4:30- and then she was aware of her heartbeat - about 1/2 hour  One episode mid morning - after making breakfast Not exertional  No sob or sweating or nausea   BP Readings from Last 3 Encounters:  09/29/14 116/74  02/17/14 116/64  02/15/13 102/60      Also very very tired   No fever or uri symptoms  occ nosebleed   No family hx of heart attacks  Lab Results  Component Value Date   CHOL 170 02/10/2014   HDL 67.80 02/10/2014   LDLCALC 85 02/10/2014   LDLDIRECT 172.6 05/20/2013   TRIG 88.0 02/10/2014   CHOLHDL 3 02/10/2014     Patient Active Problem List   Diagnosis Date Noted  . Chest pain 09/29/2014  . Left ear pain 09/29/2014  . Dizziness 09/29/2014  . Colon cancer screening 02/17/2014  . Other screening mammogram 02/07/2011  . Routine general medical examination at a health care facility 02/01/2011  . UNSPECIFIED VITAMIN D DEFICIENCY 04/14/2008  . Disorder of bone and cartilage 01/09/2007  . HYPERLIPIDEMIA 01/04/2007  . VAGINITIS, ATROPHIC 01/04/2007  . SCOLIOSIS 01/04/2007  . CHRONIC FATIGUE SYNDROME 01/04/2007   Past Medical History  Diagnosis Date  . Hyperlipidemia   . Osteopenia   . Chronic fatigue syndrome   . S/P cervical polypectomy    Past Surgical History  Procedure Laterality Date  . Abdominal hysterectomy      one ovary removed/ bleeding   History  Substance Use Topics  . Smoking status: Never Smoker   . Smokeless tobacco: Not on file  . Alcohol Use: No   Family History  Problem Relation  Age of Onset  . Stroke Mother   . Hypertension Father   . Hyperlipidemia Sister   . Hyperlipidemia Brother   . Cancer Paternal Uncle   . Cancer Maternal Grandmother     intestinal CA  . Cancer Paternal Grandfather     CA unsure which kind  . Hyperlipidemia Brother    Allergies  Allergen Reactions  . Alendronate Sodium     REACTION: GI  . Amoxicillin     REACTION: vomiting  . Ibuprofen     REACTION: rash   Current Outpatient Prescriptions on File Prior to Visit  Medication Sig Dispense Refill  . Calcium Carbonate (CALTRATE 600 PO) Take 2 tablets by mouth daily.      . Cholecalciferol (VITAMIN D) 2000 UNITS CAPS Take 1 capsule by mouth daily.      . simvastatin (ZOCOR) 40 MG tablet Take 1 tablet (40 mg total) by mouth at bedtime. 30 tablet 11   No current facility-administered medications on file prior to visit.    Review of Systems Review of Systems  Constitutional: Negative for fever, appetite change, fatigue and unexpected weight change.  Eyes: Negative for pain and visual disturbance.  ENT neg for cong and rhinorrhea ,neg for ear drainage  Respiratory: Negative for cough and shortness of breath.  Cardiovascular: pos for occ palpitations , neg for PND or orthopnea or pedal edema   Gastrointestinal: Negative for nausea, diarrhea and constipation.  Genitourinary: Negative for urgency and frequency.  Skin: Negative for pallor or rash   Neurological: Negative for weakness, light-headedness, numbness and headaches.  Hematological: Negative for adenopathy. Does not bruise/bleed easily.  Psychiatric/Behavioral: Negative for dysphoric mood. The patient is not nervous/anxious.         Objective:   Physical Exam  Constitutional: She appears well-developed and well-nourished. No distress.  HENT:  Head: Normocephalic and atraumatic.  Right Ear: External ear normal.  Left Ear: External ear normal.  Nose: Nose normal.  Mouth/Throat: Oropharynx is clear and moist.  Eyes:  Conjunctivae and EOM are normal. Pupils are equal, round, and reactive to light. Right eye exhibits no discharge. Left eye exhibits no discharge. No scleral icterus.  Neck: Normal range of motion. Neck supple. No JVD present. Carotid bruit is not present. No thyromegaly present.  Cardiovascular: Normal rate, regular rhythm, normal heart sounds and intact distal pulses.  Exam reveals no gallop.   Pulmonary/Chest: Effort normal and breath sounds normal. No respiratory distress. She has no wheezes. She has no rales. She exhibits no tenderness.  Abdominal: Soft. Bowel sounds are normal. She exhibits no distension and no mass. There is no tenderness.  Musculoskeletal: She exhibits no edema or tenderness.  Lymphadenopathy:    She has no cervical adenopathy.  Neurological: She is alert. She has normal reflexes. She displays no atrophy and no tremor. No cranial nerve deficit or sensory deficit. She exhibits normal muscle tone. Coordination and gait normal.  No cerebellar signs   Skin: Skin is warm and dry. No rash noted. No erythema. No pallor.  Psychiatric: She has a normal mood and affect.          Assessment & Plan:   Problem List Items Addressed This Visit      Other   Chest pain - Primary    Atypical but still warrants eval  Not high risk pt  Is under much stress - caring for toddler full time  EKG - some T wave inversions in precordial leads Ref to cardiol for further eval and poss stress testing If worse-will update or go to ER       Relevant Orders   EKG 12-Lead (Completed)   Ambulatory referral to Cardiology   Dizziness    Now resolved Nl exam Suspect vertigo rel to her ear problem Will update if symptoms return       Left ear pain    This is now resolved Nl exam  Suspect she had ETD Will update if symptoms return

## 2014-09-29 NOTE — Assessment & Plan Note (Signed)
This is now resolved Nl exam  Suspect she had ETD Will update if symptoms return

## 2014-10-15 ENCOUNTER — Encounter: Payer: Self-pay | Admitting: Cardiology

## 2014-10-15 ENCOUNTER — Ambulatory Visit (INDEPENDENT_AMBULATORY_CARE_PROVIDER_SITE_OTHER): Payer: BLUE CROSS/BLUE SHIELD | Admitting: Cardiology

## 2014-10-15 VITALS — BP 118/70 | HR 66 | Ht 63.0 in | Wt 122.5 lb

## 2014-10-15 DIAGNOSIS — R0789 Other chest pain: Secondary | ICD-10-CM

## 2014-10-15 DIAGNOSIS — R5382 Chronic fatigue, unspecified: Secondary | ICD-10-CM

## 2014-10-15 DIAGNOSIS — E785 Hyperlipidemia, unspecified: Secondary | ICD-10-CM

## 2014-10-15 DIAGNOSIS — G9332 Myalgic encephalomyelitis/chronic fatigue syndrome: Secondary | ICD-10-CM

## 2014-10-15 DIAGNOSIS — R079 Chest pain, unspecified: Secondary | ICD-10-CM

## 2014-10-15 NOTE — Patient Instructions (Signed)
Your physician has requested that you have a stress echocardiogram. For further information please visit [email or web address]. Please follow instruction sheet as given.  Wear comfortable clothing and walking shoes No lotions or perfume Can eat regular diet and take medications  Your physician recommends that you schedule a follow-up appointment in: 1 month

## 2014-10-17 ENCOUNTER — Encounter: Payer: Self-pay | Admitting: Cardiology

## 2014-10-17 DIAGNOSIS — R079 Chest pain, unspecified: Secondary | ICD-10-CM | POA: Insufficient documentation

## 2014-10-17 NOTE — Assessment & Plan Note (Signed)
She seems to think that she would be able to do treadmill stress testing. If she is not able to get her up on the treadmill then we may need to change years and switched over to [employer].

## 2014-10-17 NOTE — Progress Notes (Signed)
PATIENT: Tracy [name] MRN: [id number] DOB: 05-17-1951 PCP: Loura Pardon, MD  Clinic Note: Chief Complaint  Patient presents with  . Initial Prenatal Visit    C/o chest tightness, chest pain and fatigue. Meds reviewed verbally with pt.   HPI: Tracy [name] is a 64 y.o. female with a PMH below who presents today for cardiology evaluation of chest pain and fatigue.  She has been noticing episodes of tightness and pressure across her chest going to the left shoulder that have been relatively short lasting, usually less than 1 minute, but is now referred because she had an episode lasted about 20 minutes just last week. None so associated with dyspnea. The episode that lasted 20 minutes happened after some activity and not with activity. He shorter episodes can happen either rest or with activity necessarily made worse with activity. She also had some issues with vertigo and nausea the headaches are associated with sharp pain several weeks ago. That seems to have improved but she still has some dizziness. 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  The remainder of Cardiovascular ROS is as follows: positive for - chest pain, palpitations and Dizziness and vertigo negative for - dyspnea on exertion, edema, [name] of consciousness, murmur, orthopnea, paroxysmal nocturnal dyspnea, rapid heart rate, shortness of breath or Syncope/near syncope or TIA/amaurosis fugax :   Past Medical History  Diagnosis Date  . Hyperlipidemia   . Osteopenia   . Chronic fatigue syndrome   . S/P cervical polypectomy     Prior Cardiac Evaluation and Past Surgical History: Past Surgical History  Procedure Laterality Date  . Abdominal hysterectomy      one ovary removed/ bleeding  . Dilation and curettage of uterus      Allergies  Allergen Reactions  . Alendronate Sodium      REACTION: GI  . Amoxicillin     REACTION: vomiting  . Ibuprofen     REACTION: rash  . Ivp Dye [Iodinated Diagnostic Agents]     Current Outpatient Prescriptions  Medication Sig Dispense Refill  . Calcium Carbonate (CALTRATE 600 PO) Take 2 tablets by mouth daily.      . Cholecalciferol (VITAMIN D) 2000 UNITS CAPS Take 1 capsule by mouth daily.      . simvastatin (ZOCOR) 40 MG tablet Take 1 tablet (40 mg total) by mouth at bedtime. 30 tablet 11   No current facility-administered medications for this visit.   History  Substance Use Topics  . Smoking status: Never Smoker   . Smokeless tobacco: Not on file  . Alcohol Use: No   she and her husband have essentially become the daytime caregivers for their 4-month-old great-grandchild. Their granddaughter who is the mother is to be on an experience to care for the child and therefore there saw and has been essentially acting as the child.Marland Kitchen Unfortunately he cannot care for the child while he is working. She basically is now taking care of this 65-month-old baby from 7 in the morning until 5 at night.   family history includes Cancer in her maternal grandmother, paternal grandfather, and paternal uncle; Heart attack in her sister; Hyperlipidemia in her brother, brother, and sister; Hypertension in her father; Stroke in her father and mother.  ROS: A comprehensive Review of Systems - was performed Review of Systems  Constitutional: Positive for malaise/fatigue (past history of chronic fatigue syndrome).  HENT:  Negative for nosebleeds.   Respiratory: Negative for cough, shortness of breath and wheezing.   Cardiovascular: Negative for claudication.  Gastrointestinal: Negative for constipation, blood in stool and melena.  Genitourinary: Negative for hematuria.  Musculoskeletal: Positive for myalgias (related to fatigue) and joint pain. Negative for falls.  Neurological: Positive for dizziness (With lightheadedness and possible vertigo. She  has been short your pain. This was a week or so ago) and headaches (Associated with vertigo and ear pain). Negative for seizures and [name] of consciousness.  Endo/Heme/Allergies: Does not bruise/bleed easily.  Psychiatric/Behavioral: The patient is nervous/anxious.   All other systems reviewed and are negative.  PHYSICAL EXAM BP 118/70 mmHg  Pulse 66  Ht 5\' 3"  (1.6 m)  Wt 122 lb 8 oz (55.566 kg)  BMI 21.71 kg/m2 General appearance: alert, cooperative, appears stated age, no distress and Healthy but very thin and frail-appearing woman. Seems somewhat anxious but otherwise pleasant mood and affect. HEENT: [place]/AT, EOMI, MMM, anicteric sclera Neck: no adenopathy, no carotid bruit, no JVD and supple, symmetrical, trachea midline Lungs: clear to auscultation bilaterally, normal percussion bilaterally and Nonlabored, good air movement Heart: regular rate and rhythm, S1, S2 normal, no murmur, click, rub or gallop and normal apical impulse Abdomen: soft, non-tender; bowel sounds normal; no masses,  no organomegaly Extremities: extremities normal, atraumatic, no cyanosis or edema, no edema, redness or tenderness in the calves or thighs and no ulcers, gangrene or trophic changes Pulses: 2+ and symmetric Skin: Somewhat decreased skin turgor but no obvious rashes or lesions. Neurologic: Alert and oriented X 3, normal strength and tone. Normal symmetric reflexes. Normal coordination and gait; CN II-XII grossly intact   Adult ECG Report  Rate: 66 ;  Rhythm: normal sinus rhythm  QRS Axis: 86 ;  PR Interval:  156 ;  QRS Duration: 94 ; QTc: 394; Voltages: normal  Conduction Disturbances: none;  Other Abnormalities: none  Narrative Interpretation: normal EKG  Recent Labs:  None available.  ASSESSMENT / PLAN:  64 year old woman withan unusual presentation of chest discomfort. She was recently told that her sister at a followup visit with her doctor was told that she may have had a silent MI. Given her age  and concerning family history, we will evaluate for ischemia.  Problem List Items Addressed This Visit    Chest pain with moderate risk for cardiac etiology - Primary    Will be prolonged episode of chest discomfort was more concerning to me with bleeding episodes that she still has. She is quite concerned with her sister just been told that she had a history of silent MI that was not previously diagnosed. With her having dyslipidemia and being in her 76s, she is at least at moderate risk for developing coronary disease. I think she should go walk on a treadmill will be well, but I would like to have some more definitive evaluation density treadmill test.  Echocardiogram will was to get a better assessment of her cardiac function initially bowel function with the preliminary evaluation. There is any abnormal then we would then proceed to full study.  Plan: Exercise Echocardiogram Stress Test      Relevant Orders   EKG 12-Lead (Completed)   Echocardiogram stress test without contrast   CHRONIC FATIGUE SYNDROME    She seems to think that she would be able to do treadmill stress testing. If she is not able to get her up on the treadmill then we may need to change years and switched over to [employer]  [employer].      Hyperlipidemia with target LDL less than 130 (Chronic)    Currently being followed by PCP. Well within current goals. On simvastatin.       Other Visit Diagnoses    Chronic fatigue        Relevant Orders    EKG 12-Lead (Completed)       No orders of the defined types were placed in this encounter.    Followup: 1 continue months  Novalie Leamy, Leonie Green, M.D., M.S. Pharmacist, community  Pager # [phone]

## 2014-10-17 NOTE — Assessment & Plan Note (Signed)
Currently being followed by PCP. Well within current goals. On simvastatin.

## 2014-10-17 NOTE — Assessment & Plan Note (Signed)
Will be prolonged episode of chest discomfort was more concerning to me with bleeding episodes that she still has. She is quite concerned with her sister just been told that she had a history of silent MI that was not previously diagnosed. With her having dyslipidemia and being in her 45s, she is at least at moderate risk for developing coronary disease. I think she should go walk on a treadmill will be well, but I would like to have some more definitive evaluation density treadmill test.  Echocardiogram will was to get a better assessment of her cardiac function initially bowel function with the preliminary evaluation. There is any abnormal then we would then proceed to full study.  Plan: Exercise Echocardiogram Stress Test

## 2014-11-17 ENCOUNTER — Other Ambulatory Visit: Payer: Self-pay

## 2014-11-17 ENCOUNTER — Ambulatory Visit (INDEPENDENT_AMBULATORY_CARE_PROVIDER_SITE_OTHER): Payer: BLUE CROSS/BLUE SHIELD

## 2014-11-17 DIAGNOSIS — R079 Chest pain, unspecified: Secondary | ICD-10-CM

## 2014-11-17 DIAGNOSIS — R5382 Chronic fatigue, unspecified: Secondary | ICD-10-CM | POA: Diagnosis not present

## 2014-11-19 ENCOUNTER — Telehealth: Payer: Self-pay | Admitting: *Deleted

## 2014-11-19 ENCOUNTER — Ambulatory Visit: Payer: BLUE CROSS/BLUE SHIELD | Admitting: Cardiology

## 2014-11-19 NOTE — Telephone Encounter (Signed)
Pt calling asking results on Echo she did. Please call patient.

## 2014-11-21 NOTE — Progress Notes (Signed)
Quick Note:  Echo results: Good news: Essentially normal echocardiogram and normal pump function and normal valve function. No signs to suggest heart attack.. EF: 60-65%. No regional wall motion abnormalities  DH ______

## 2014-12-03 ENCOUNTER — Ambulatory Visit: Payer: BLUE CROSS/BLUE SHIELD | Admitting: Cardiology

## 2015-02-12 ENCOUNTER — Telehealth: Payer: Self-pay | Admitting: Family Medicine

## 2015-02-12 DIAGNOSIS — E559 Vitamin D deficiency, unspecified: Secondary | ICD-10-CM

## 2015-02-12 DIAGNOSIS — Z0000 Encounter for general adult medical examination without abnormal findings: Secondary | ICD-10-CM

## 2015-02-12 NOTE — Telephone Encounter (Signed)
-----   Message from Ellamae Sia sent at 02/04/2015  3:22 PM EDT ----- Regarding: Lab orders for Friday, 7.22.16 Patient is scheduled for CPX labs, please order future labs, Thanks , Karna Christmas

## 2015-02-13 ENCOUNTER — Other Ambulatory Visit (INDEPENDENT_AMBULATORY_CARE_PROVIDER_SITE_OTHER): Payer: BLUE CROSS/BLUE SHIELD

## 2015-02-13 DIAGNOSIS — Z0000 Encounter for general adult medical examination without abnormal findings: Secondary | ICD-10-CM

## 2015-02-13 DIAGNOSIS — E559 Vitamin D deficiency, unspecified: Secondary | ICD-10-CM | POA: Diagnosis not present

## 2015-02-13 LAB — CBC WITH DIFFERENTIAL/PLATELET
BASOS ABS: 0 10*3/uL (ref 0.0–0.1)
Basophils Relative: 0.7 % (ref 0.0–3.0)
EOS ABS: 0.1 10*3/uL (ref 0.0–0.7)
Eosinophils Relative: 2.8 % (ref 0.0–5.0)
HCT: 40.6 % (ref 36.0–46.0)
Hemoglobin: 13.6 g/dL (ref 12.0–15.0)
Lymphocytes Relative: 41.9 % (ref 12.0–46.0)
Lymphs Abs: 2.1 10*3/uL (ref 0.7–4.0)
MCHC: 33.5 g/dL (ref 30.0–36.0)
MCV: 91.8 fl (ref 78.0–100.0)
MONO ABS: 0.4 10*3/uL (ref 0.1–1.0)
Monocytes Relative: 8.7 % (ref 3.0–12.0)
Neutro Abs: 2.3 10*3/uL (ref 1.4–7.7)
Neutrophils Relative %: 45.9 % (ref 43.0–77.0)
Platelets: 179 10*3/uL (ref 150.0–400.0)
RBC: 4.42 Mil/uL (ref 3.87–5.11)
RDW: 13.6 % (ref 11.5–15.5)
WBC: 5.1 10*3/uL (ref 4.0–10.5)

## 2015-02-13 LAB — COMPREHENSIVE METABOLIC PANEL
ALT: 17 U/L (ref 0–35)
AST: 24 U/L (ref 0–37)
Albumin: 4 g/dL (ref 3.5–5.2)
Alkaline Phosphatase: 47 U/L (ref 39–117)
BUN: 12 mg/dL (ref 6–23)
CO2: 30 mEq/L (ref 19–32)
CREATININE: 0.71 mg/dL (ref 0.40–1.20)
Calcium: 9.2 mg/dL (ref 8.4–10.5)
Chloride: 107 mEq/L (ref 96–112)
GFR: 88.3 mL/min (ref >60.00)
GLUCOSE: 84 mg/dL (ref 70–99)
Potassium: 4.1 mEq/L (ref 3.5–5.1)
SODIUM: 142 meq/L (ref 135–145)
TOTAL PROTEIN: 6.3 g/dL (ref 6.0–8.3)
Total Bilirubin: 0.5 mg/dL (ref 0.2–1.2)

## 2015-02-13 LAB — LIPID PANEL
CHOL/HDL RATIO: 3
CHOLESTEROL: 173 mg/dL (ref 0–200)
HDL: 58.1 mg/dL (ref >39.00)
LDL CALC: 102 mg/dL — AB (ref 0–99)
NONHDL: 114.9
TRIGLYCERIDES: 64 mg/dL (ref 0.0–149.0)
VLDL: 12.8 mg/dL (ref 0.0–40.0)

## 2015-02-13 LAB — VITAMIN D 25 HYDROXY (VIT D DEFICIENCY, FRACTURES): VITD: 44.64 ng/mL (ref 30.00–100.00)

## 2015-02-13 LAB — TSH: TSH: 2.15 u[IU]/mL (ref 0.35–4.50)

## 2015-02-20 ENCOUNTER — Other Ambulatory Visit: Payer: Self-pay | Admitting: Family Medicine

## 2015-02-20 ENCOUNTER — Ambulatory Visit (INDEPENDENT_AMBULATORY_CARE_PROVIDER_SITE_OTHER): Payer: BLUE CROSS/BLUE SHIELD | Admitting: Family Medicine

## 2015-02-20 ENCOUNTER — Encounter: Payer: Self-pay | Admitting: Family Medicine

## 2015-02-20 VITALS — BP 110/58 | HR 67 | Temp 97.8°F | Ht 62.75 in | Wt 122.2 lb

## 2015-02-20 DIAGNOSIS — E785 Hyperlipidemia, unspecified: Secondary | ICD-10-CM | POA: Diagnosis not present

## 2015-02-20 DIAGNOSIS — E559 Vitamin D deficiency, unspecified: Secondary | ICD-10-CM

## 2015-02-20 DIAGNOSIS — Z0000 Encounter for general adult medical examination without abnormal findings: Secondary | ICD-10-CM | POA: Diagnosis not present

## 2015-02-20 DIAGNOSIS — M8580 Other specified disorders of bone density and structure, unspecified site: Secondary | ICD-10-CM | POA: Diagnosis not present

## 2015-02-20 DIAGNOSIS — Z1231 Encounter for screening mammogram for malignant neoplasm of breast: Secondary | ICD-10-CM

## 2015-02-20 MED ORDER — SIMVASTATIN 40 MG PO TABS: 40.0000 mg | ORAL_TABLET | Freq: Every day | ORAL | Status: DC

## 2015-02-20 NOTE — Progress Notes (Signed)
Subjective:    Patient ID: Tracy [name], female    DOB: 06-06-1951, 64 y.o.   MRN: [id number]  HPI Here for health maintenance exam and to review chronic medical problems    Having a good summer overall/ busy   Wt is stable with bmi of 21  Takes care of herself   Hep C/ HIV screen -not high risk and declines screening   Pap 6/07  Had a hysterectomy-no problems   Flu shot 10/15  Mammogram 8/15 nl - due next month  Self exam no lumps or changes   Td 6/08 colonosc 9/15-nl with 10 year recall   Zoster vaccine 7/13  Hyperlipidemia Lab Results  Component Value Date   CHOL 173 02/13/2015   CHOL 170 02/10/2014   CHOL 253* 05/20/2013   Lab Results  Component Value Date   HDL 58.10 02/13/2015   HDL 67.80 02/10/2014   HDL 65.50 05/20/2013   Lab Results  Component Value Date   LDLCALC 102* 02/13/2015   LDLCALC 85 02/10/2014   LDLCALC 93 02/08/2013   Lab Results  Component Value Date   TRIG 64.0 02/13/2015   TRIG 88.0 02/10/2014   TRIG 103.0 05/20/2013   Lab Results  Component Value Date   CHOLHDL 3 02/13/2015   CHOLHDL 3 02/10/2014   CHOLHDL 4 05/20/2013   Lab Results  Component Value Date   LDLDIRECT 172.6 05/20/2013   zocor and diet  No change in dose or missed doses Eating worse - more hamburgers   No exercise - she was until she started taking care of a child  Plans to start walking again -also chasing a 64 year old     Results for orders placed or performed in visit on 02/13/15  CBC with Differential/Platelet  Result Value Ref Range   WBC 5.1 4.0 - 10.5 K/uL   RBC 4.42 3.87 - 5.11 Mil/uL   Hemoglobin 13.6 12.0 - 15.0 g/dL   HCT 40.6 36.0 - 46.0 %   MCV 91.8 78.0 - 100.0 fl   MCHC 33.5 30.0 - 36.0 g/dL   RDW 13.6 11.5 - 15.5 %   Platelets 179.0 150.0 - 400.0 K/uL   Neutrophils Relative % 45.9 43.0 - 77.0 %   Lymphocytes Relative 41.9 12.0 - 46.0 %   Monocytes Relative 8.7 3.0 - 12.0 %   Eosinophils Relative 2.8 0.0 - 5.0 %   Basophils Relative 0.7 0.0 - 3.0 %   Neutro Abs 2.3 1.4 - 7.7 K/uL   Lymphs Abs 2.1 0.7 - 4.0 K/uL   Monocytes Absolute 0.4 0.1 - 1.0 K/uL   Eosinophils Absolute 0.1 0.0 - 0.7 K/uL   Basophils Absolute 0.0 0.0 - 0.1 K/uL  Comprehensive metabolic panel  Result Value Ref Range   Sodium 142 135 - 145 mEq/L   Potassium 4.1 3.5 - 5.1 mEq/L   Chloride 107 96 - 112 mEq/L   CO2 30 19 - 32 mEq/L   Glucose, Bld 84 70 - 99 mg/dL   BUN 12 6 - 23 mg/dL   Creatinine, Ser 0.71 0.40 - 1.20 mg/dL   Total Bilirubin 0.5 0.2 - 1.2 mg/dL   Alkaline Phosphatase 47 39 - 117 U/L   AST 24 0 - 37 U/L   ALT 17 0 - 35 U/L   Total Protein 6.3 6.0 - 8.3 g/dL   Albumin 4.0 3.5 - 5.2 g/dL   Calcium 9.2 8.4 - 10.5 mg/dL   GFR 88.30 >60.00 mL/min  Lipid panel  Result Value Ref Range   Cholesterol 173 0 - 200 mg/dL   Triglycerides 64.0 0.0 - 149.0 mg/dL   HDL 58.10 >39.00 mg/dL   VLDL 12.8 0.0 - 40.0 mg/dL   LDL Cholesterol 102 (H) 0 - 99 mg/dL   Total CHOL/HDL Ratio 3    NonHDL 114.90   TSH  Result Value Ref Range   TSH 2.15 0.35 - 4.50 uIU/mL  Vit D  25 hydroxy (rtn osteoporosis monitoring)  Result Value Ref Range   VITD 44.64 30.00 - 100.00 ng/mL     dexa 10/15 unchanged  Osteopenia   No falls or fractures Took 5 y of evista in the past   Patient Active Problem List   Diagnosis Date Noted  . Chest pain with moderate risk for cardiac etiology 10/17/2014  . Left ear pain 09/29/2014  . Dizziness 09/29/2014  . Colon cancer screening 02/17/2014  . Other screening mammogram 02/07/2011  . Routine general medical examination at a health care facility 02/01/2011  . Vitamin D deficiency 04/14/2008  . Disorder of bone and cartilage 01/09/2007  . Hyperlipidemia with target LDL less than 130 01/04/2007  . VAGINITIS, ATROPHIC 01/04/2007  . SCOLIOSIS 01/04/2007  . CHRONIC FATIGUE SYNDROME 01/04/2007   Past Medical History  Diagnosis Date  . Hyperlipidemia   . Osteopenia   . Chronic fatigue syndrome    . S/P cervical polypectomy    Past Surgical History  Procedure Laterality Date  . Abdominal hysterectomy      one ovary removed/ bleeding  . Dilation and curettage of uterus     History  Substance Use Topics  . Smoking status: Never Smoker   . Smokeless tobacco: Not on file  . Alcohol Use: No   Family History  Problem Relation Age of Onset  . Stroke Mother   . Hypertension Father   . Stroke Father   . Hyperlipidemia Sister   . Hyperlipidemia Brother   . Cancer Paternal Uncle   . Cancer Maternal Grandmother     intestinal CA  . Cancer Paternal Grandfather     CA unsure which kind  . Hyperlipidemia Brother   . Heart attack Sister     Thought to be silent MI   Allergies  Allergen Reactions  . Alendronate Sodium     REACTION: GI  . Amoxicillin     REACTION: vomiting  . Ibuprofen     REACTION: rash  . Ivp Dye [Iodinated Diagnostic Agents]    Current Outpatient Prescriptions on File Prior to Visit  Medication Sig Dispense Refill  . Calcium Carbonate (CALTRATE 600 PO) Take 2 tablets by mouth daily.      . Cholecalciferol (VITAMIN D) 2000 UNITS CAPS Take 1 capsule by mouth daily.      . simvastatin (ZOCOR) 40 MG tablet Take 1 tablet (40 mg total) by mouth at bedtime. 30 tablet 11   No current facility-administered medications on file prior to visit.      Review of Systems Review of Systems  Constitutional: Negative for fever, appetite change, fatigue and unexpected weight change.  Eyes: Negative for pain and visual disturbance.  Respiratory: Negative for cough and shortness of breath.   Cardiovascular: Negative for cp or palpitations    Gastrointestinal: Negative for nausea, diarrhea and constipation.  Genitourinary: Negative for urgency and frequency.  Skin: Negative for pallor or rash   Neurological: Negative for weakness, light-headedness, numbness and headaches.  Hematological: Negative for adenopathy. Does not bruise/bleed easily.  Psychiatric/Behavioral:  Negative for  dysphoric mood. The patient is not nervous/anxious.         Objective:   Physical Exam  Constitutional: She appears well-developed and well-nourished. No distress.  Well app  HENT:  Head: Normocephalic and atraumatic.  Right Ear: External ear normal.  Left Ear: External ear normal.  Mouth/Throat: Oropharynx is clear and moist.  Eyes: Conjunctivae and EOM are normal. Pupils are equal, round, and reactive to light. No scleral icterus.  Neck: Normal range of motion. Neck supple. No JVD present. Carotid bruit is not present. No thyromegaly present.  Cardiovascular: Normal rate, regular rhythm, normal heart sounds and intact distal pulses.  Exam reveals no gallop.   Pulmonary/Chest: Effort normal and breath sounds normal. No respiratory distress. She has no wheezes. She exhibits no tenderness.  Abdominal: Soft. Bowel sounds are normal. She exhibits no distension, no abdominal bruit and no mass. There is no tenderness.  Genitourinary: No breast swelling, tenderness, discharge or bleeding.  Breast exam: No mass, nodules, thickening, tenderness, bulging, retraction, inflamation, nipple discharge or skin changes noted.  No axillary or clavicular LA.      Musculoskeletal: Normal range of motion. She exhibits no edema or tenderness.  No kyphosis   Lymphadenopathy:    She has no cervical adenopathy.  Neurological: She is alert. She has normal reflexes. No cranial nerve deficit. She exhibits normal muscle tone. Coordination normal.  Skin: Skin is warm and dry. No rash noted. No erythema. No pallor.  Psychiatric: She has a normal mood and affect.  Nursing note and vitals reviewed.         Assessment & Plan:   Problem List Items Addressed This Visit    Hyperlipidemia with target LDL less than 130 (Chronic)    Disc goals for lipids and reasons to control them Rev labs with pt Rev low sat fat diet in detail At goal- LDL up a bit  Will keep working on diet       Relevant  Medications   simvastatin (ZOCOR) 40 MG tablet   Osteopenia - Primary    S/p 5 y of evista stable dexa fall of 2015  No falls or fx  Disc need for calcium/ vitamin D/ wt bearing exercise and bone density test every 2 y to monitor Disc safety/ fracture risk in detail        Routine general medical examination at a health care facility    Reviewed health habits including diet and exercise and skin cancer prevention Reviewed appropriate screening tests for age  Also reviewed health mt list, fam hx and immunization status , as well as social and family history   See HPI Labs reviewed Declines Hep C/HIV screening  Will schedule her own mammogram       Vitamin D deficiency    Vitamin D level is therapeutic with current supplementation Disc importance of this to bone and overall health

## 2015-02-20 NOTE — Assessment & Plan Note (Signed)
Reviewed health habits including diet and exercise and skin cancer prevention Reviewed appropriate screening tests for age  Also reviewed health mt list, fam hx and immunization status , as well as social and family history   See HPI Labs reviewed Declines Hep C/HIV screening  Will schedule her own mammogram

## 2015-02-20 NOTE — Progress Notes (Signed)
Pre visit review using our clinic review tool, if applicable. No additional management support is needed unless otherwise documented below in the visit note. 

## 2015-02-20 NOTE — Assessment & Plan Note (Signed)
Vitamin D level is therapeutic with current supplementation Disc importance of this to bone and overall health  

## 2015-02-20 NOTE — Patient Instructions (Signed)
Don't forget to get your mammogram  Take care of yourself  You are doing great  Labs are ok  Avoid red meat/ fried foods/ egg yolks/ fatty breakfast meats/ butter, cheese and high fat dairy/ and shellfish

## 2015-02-20 NOTE — Assessment & Plan Note (Signed)
S/p 5 y of evista stable dexa fall of 2015  No falls or fx  Disc need for calcium/ vitamin D/ wt bearing exercise and bone density test every 2 y to monitor Disc safety/ fracture risk in detail

## 2015-02-20 NOTE — Assessment & Plan Note (Signed)
Disc goals for lipids and reasons to control them Rev labs with pt Rev low sat fat diet in detail At goal- LDL up a bit  Will keep working on diet

## 2015-03-18 ENCOUNTER — Ambulatory Visit
Admission: RE | Admit: 2015-03-18 | Discharge: 2015-03-18 | Disposition: A | Discharge status: Home / Self Care | Payer: BLUE CROSS/BLUE SHIELD | Source: Ambulatory Visit | Attending: Family Medicine | Admitting: Family Medicine

## 2015-03-18 DIAGNOSIS — Z1231 Encounter for screening mammogram for malignant neoplasm of breast: Secondary | ICD-10-CM | POA: Diagnosis present

## 2015-05-05 ENCOUNTER — Encounter: Payer: Self-pay | Admitting: Family Medicine

## 2015-09-01 ENCOUNTER — Ambulatory Visit (INDEPENDENT_AMBULATORY_CARE_PROVIDER_SITE_OTHER): Payer: BLUE CROSS/BLUE SHIELD | Admitting: Family Medicine

## 2015-09-01 ENCOUNTER — Encounter: Payer: Self-pay | Admitting: Family Medicine

## 2015-09-01 VITALS — BP 126/82 | HR 60 | Temp 97.8°F | Ht 62.75 in | Wt 123.2 lb

## 2015-09-01 DIAGNOSIS — R1011 Right upper quadrant pain: Secondary | ICD-10-CM

## 2015-09-01 NOTE — Patient Instructions (Signed)
Stop at check out for referral for an abdominal ultrasound  Get zantac 150 mg (store brand is fine) and take 1 pill twice daily for now  Labs today  Stay away from spicy and fatty foods  Also avoid ibuprofen or any anti inflammatories   We will get results and make a plan from there   If your symptoms suddenly worsen- get attention at the ER and let us know

## 2015-09-01 NOTE — Assessment & Plan Note (Signed)
Epigastric and RUQ  Diff incl gallbladder cause/ gastritis/ less likely AAA abd [place] scheduled  Lab today  Trial of zantac 150 otc bid for poss acid Bland diet, avoid nsaids and fatty food  Update and seek care at ED if symptoms suddenly worsen

## 2015-09-01 NOTE — Progress Notes (Signed)
Pre visit review using our clinic review tool, if applicable. No additional management support is needed unless otherwise documented below in the visit note. 

## 2015-09-01 NOTE — Progress Notes (Signed)
Subjective:    Patient ID: Tracy [name], female    DOB: 09-30-1950, 65 y.o.   MRN: [id number]  HPI Here with some stomach pain    Injured her low back in mid January - when she lifted her foot up to put lotion on it Then her upper back hurt   Then her stomach started hurting  A dull aching pain  Is gradually increasing  Several nights wakes up with it Not sharp / not burning and does not feel like hunger pain  Pain goes through to her mid back  No n/v  No bowel changes - no black stool or blood in stool   She tried taking advil for her back - did not affect her stomach  Has not taken much of it  Tried tums - no help at all  No other medicines otc   No hx of abd surgeries  Never had an ulcer  Stress level is ok   Patient Active Problem List   Diagnosis Date Noted  . Chest pain with moderate risk for cardiac etiology 10/17/2014  . Left ear pain 09/29/2014  . Dizziness 09/29/2014  . Colon cancer screening 02/17/2014  . Other screening mammogram 02/07/2011  . Routine general medical examination at a health care facility 02/01/2011  . Vitamin D deficiency 04/14/2008  . Osteopenia 01/09/2007  . Hyperlipidemia with target LDL less than 130 01/04/2007  . VAGINITIS, ATROPHIC 01/04/2007  . SCOLIOSIS 01/04/2007  . CHRONIC FATIGUE SYNDROME 01/04/2007   Past Medical History  Diagnosis Date  . Hyperlipidemia   . Osteopenia   . Chronic fatigue syndrome   . S/P cervical polypectomy    Past Surgical History  Procedure Laterality Date  . Abdominal hysterectomy      one ovary removed/ bleeding  . Dilation and curettage of uterus     Social History  Substance Use Topics  . Smoking status: Never Smoker   . Smokeless tobacco: None  . Alcohol Use: No   Family History  Problem Relation Age of Onset  . Stroke Mother   . Hypertension Father   . Stroke Father   . Hyperlipidemia Sister   . Hyperlipidemia Brother   . Cancer Paternal Uncle   . Cancer Maternal  Grandmother     intestinal CA  . Cancer Paternal Grandfather     CA unsure which kind  . Hyperlipidemia Brother   . Heart attack Sister     Thought to be silent MI   Allergies  Allergen Reactions  . Alendronate Sodium     REACTION: GI  . Amoxicillin     REACTION: vomiting  . Ibuprofen     REACTION: rash  . Ivp Dye [Iodinated Diagnostic Agents]    Current Outpatient Prescriptions on File Prior to Visit  Medication Sig Dispense Refill  . Calcium Carbonate (CALTRATE 600 PO) Take 2 tablets by mouth daily.      . Cholecalciferol (VITAMIN D) 2000 UNITS CAPS Take 1 capsule by mouth daily.      . simvastatin (ZOCOR) 40 MG tablet Take 1 tablet (40 mg total) by mouth at bedtime. 30 tablet 11   No current facility-administered medications on file prior to visit.    Review of Systems Review of Systems  Constitutional: Negative for fever, appetite change, fatigue and unexpected weight change.  Eyes: Negative for pain and visual disturbance.  Respiratory: Negative for cough and shortness of breath.   Cardiovascular: Negative for cp or palpitations    Gastrointestinal: Negative  for nausea, diarrhea and constipation. pos for upper abd pain , neg for blood in stool  Genitourinary: Negative for urgency and frequency.  Skin: Negative for pallor or rash   Neurological: Negative for weakness, light-headedness, numbness and headaches.  Hematological: Negative for adenopathy. Does not bruise/bleed easily.  Psychiatric/Behavioral: Negative for dysphoric mood. The patient is not nervous/anxious.         Objective:   Physical Exam  Constitutional: She appears well-developed and well-nourished. No distress.  Well appearing   HENT:  Head: Normocephalic and atraumatic.  Mouth/Throat: Oropharynx is clear and moist.  Eyes: Conjunctivae and EOM are normal. Pupils are equal, round, and reactive to light. No scleral icterus.  Neck: Normal range of motion. Neck supple.  Cardiovascular: Normal rate,  regular rhythm and normal heart sounds.   Pulmonary/Chest: Effort normal and breath sounds normal. No respiratory distress. She has no wheezes. She has no rales.  Abdominal: Soft. Bowel sounds are normal. She exhibits no distension, no pulsatile liver, no abdominal bruit, no pulsatile midline mass and no mass. There is no hepatosplenomegaly. There is tenderness in the right upper quadrant and epigastric area. There is positive Murphy's sign. There is no rebound, no guarding, no CVA tenderness and no tenderness at McBurney's point.  Musculoskeletal: She exhibits no edema.  Lymphadenopathy:    She has no cervical adenopathy.  Neurological: She is alert.  Skin: Skin is warm and dry. No rash noted. No erythema. No pallor.  Psychiatric: She has a normal mood and affect.          Assessment & Plan:   Problem List Items Addressed This Visit      Other   Abdominal pain, right upper quadrant - Primary    Epigastric and RUQ  Diff incl gallbladder cause/ gastritis/ less likely AAA abd [place] scheduled  Lab today  Trial of zantac 150 otc bid for poss acid Bland diet, avoid nsaids and fatty food  Update and seek care at ED if symptoms suddenly worsen       Relevant Orders   US Abdomen Complete   CBC with Differential/Platelet   Comprehensive metabolic panel   Amylase   Lipase

## 2015-09-02 LAB — LIPASE: Lipase: 44 U/L (ref 11.0–59.0)

## 2015-09-02 LAB — COMPREHENSIVE METABOLIC PANEL
ALK PHOS: 49 U/L (ref 39–117)
ALT: 23 U/L (ref 0–35)
AST: 25 U/L (ref 0–37)
Albumin: 4.1 g/dL (ref 3.5–5.2)
BILIRUBIN TOTAL: 0.4 mg/dL (ref 0.2–1.2)
BUN: 17 mg/dL (ref 6–23)
CO2: 31 mEq/L (ref 19–32)
Calcium: 9.6 mg/dL (ref 8.4–10.5)
Chloride: 106 mEq/L (ref 96–112)
Creatinine, Ser: 0.7 mg/dL (ref 0.40–1.20)
GFR: 89.6 mL/min (ref >60.00)
Glucose, Bld: 86 mg/dL (ref 70–99)
Potassium: 5 mEq/L (ref 3.5–5.1)
SODIUM: 142 meq/L (ref 135–145)
Total Protein: 6.6 g/dL (ref 6.0–8.3)

## 2015-09-02 LAB — CBC WITH DIFFERENTIAL/PLATELET
BASOS ABS: 0.1 10*3/uL (ref 0.0–0.1)
BASOS PCT: 0.8 % (ref 0.0–3.0)
EOS ABS: 0.1 10*3/uL (ref 0.0–0.7)
Eosinophils Relative: 1.7 % (ref 0.0–5.0)
HEMATOCRIT: 40.9 % (ref 36.0–46.0)
Hemoglobin: 13.5 g/dL (ref 12.0–15.0)
LYMPHS PCT: 42 % (ref 12.0–46.0)
Lymphs Abs: 3.3 10*3/uL (ref 0.7–4.0)
MCHC: 33 g/dL (ref 30.0–36.0)
MCV: 91.6 fl (ref 78.0–100.0)
MONO ABS: 0.6 10*3/uL (ref 0.1–1.0)
Monocytes Relative: 8.3 % (ref 3.0–12.0)
Neutro Abs: 3.7 10*3/uL (ref 1.4–7.7)
Neutrophils Relative %: 47.2 % (ref 43.0–77.0)
PLATELETS: 191 10*3/uL (ref 150.0–400.0)
RBC: 4.47 Mil/uL (ref 3.87–5.11)
RDW: 13.9 % (ref 11.5–15.5)
WBC: 7.8 10*3/uL (ref 4.0–10.5)

## 2015-09-02 LAB — AMYLASE: Amylase: 54 U/L (ref 27–131)

## 2015-09-07 ENCOUNTER — Ambulatory Visit
Admission: RE | Admit: 2015-09-07 | Discharge: 2015-09-07 | Disposition: A | Discharge status: Home / Self Care | Payer: BLUE CROSS/BLUE SHIELD | Source: Ambulatory Visit | Attending: Family Medicine | Admitting: Family Medicine

## 2015-09-07 ENCOUNTER — Encounter: Payer: Self-pay | Admitting: Family Medicine

## 2015-09-07 DIAGNOSIS — R938 Abnormal findings on diagnostic imaging of other specified body structures: Secondary | ICD-10-CM | POA: Insufficient documentation

## 2015-09-07 DIAGNOSIS — R1011 Right upper quadrant pain: Secondary | ICD-10-CM

## 2015-10-09 ENCOUNTER — Encounter: Payer: Self-pay | Admitting: Family Medicine

## 2015-10-09 ENCOUNTER — Ambulatory Visit (INDEPENDENT_AMBULATORY_CARE_PROVIDER_SITE_OTHER): Payer: BLUE CROSS/BLUE SHIELD | Admitting: Family Medicine

## 2015-10-09 VITALS — BP 124/78 | HR 68 | Temp 97.8°F | Ht 62.75 in | Wt 123.5 lb

## 2015-10-09 DIAGNOSIS — R1013 Epigastric pain: Secondary | ICD-10-CM | POA: Insufficient documentation

## 2015-10-09 DIAGNOSIS — R1010 Upper abdominal pain, unspecified: Secondary | ICD-10-CM

## 2015-10-09 NOTE — Patient Instructions (Signed)
Try stopping prilosec and if no improvement -then try taking 2 pills (40 mg) once daily  Stick with a bland diet  Stop at check out for referral for CT of the abdomen and pelvis   We would consider GI consult if nothing is found

## 2015-10-09 NOTE — Progress Notes (Signed)
Subjective:    Patient ID: Tracy [name], female    DOB: [birth year], 65 y.o.   MRN: [id number]  HPI Here for f/u of dyspepsia/abd pain  Last visit had RUQ pain - had nl [place]    US Abdomen Complete   Status: Final result       PACS Images     Show images for US Abdomen Complete     Study Result     CLINICAL DATA: Right upper quadrant abdominal pain.  EXAM: ABDOMEN ULTRASOUND COMPLETE  COMPARISON: None.  FINDINGS: Gallbladder: No gallstones or wall thickening visualized. No sonographic Murphy sign noted by sonographer.  Common bile duct: Diameter: Normal caliber, 4 mm  Liver: No focal lesion identified. Within normal limits in parenchymal echogenicity.  IVC: No abnormality visualized.  Pancreas: Visualized portion unremarkable.  Spleen: Size and appearance within normal limits.  Right Kidney: Length: 9.9 cm. Possible small nonobstructing stone in the upper pole measuring 3 mm. Echogenicity within normal limits. No mass or hydronephrosis visualized.  Left Kidney: Length: 10.0 cm. Echogenicity within normal limits. No mass or hydronephrosis visualized.  Abdominal aorta: Normal caliber with maximum diameter 1.9 cm.  Other findings: None.  IMPRESSION: Possible small nonobstructing right upper pole renal stone. No acute findings.   She tried zantac 150-no imp   Then prilosec 20 mg otc - and stopped advil  This helped at first - took pain away  Then pain started to return -- she notes if she slumps - it hurts (under L rib cage) The R side is ok -worse on the L now   This past week-it has intensified  Stopped taking any nsaids  occ feels a little nausea but she does not vomit   Tends to be constipated-that is improved  Pain eases a bit after a bm  Sometimes she get full faster than usual  No fever        Chemistry      Component Value Date/Time   NA 142 09/01/2015 1548   K 5.0 09/01/2015 1548   CL 106 09/01/2015 1548   CO2 31 09/01/2015 1548   BUN 17 09/01/2015 1548   CREATININE 0.70 09/01/2015 1548      Component Value Date/Time   CALCIUM 9.6 09/01/2015 1548   ALKPHOS 49 09/01/2015 1548   AST 25 09/01/2015 1548   ALT 23 09/01/2015 1548   BILITOT 0.4 09/01/2015 1548      Lab Results  Component Value Date   WBC 7.8 09/01/2015   HGB 13.5 09/01/2015   HCT 40.9 09/01/2015   MCV 91.6 09/01/2015   PLT 191.0 09/01/2015   amylase and lipase nl also  Patient Active Problem List   Diagnosis Date Noted  . Dyspepsia 10/09/2015  . Upper abdominal pain 10/09/2015  . Abdominal pain, right upper quadrant 09/01/2015  . Chest pain with moderate risk for cardiac etiology 10/17/2014  . Left ear pain 09/29/2014  . Dizziness 09/29/2014  . Colon cancer screening 02/17/2014  . Other screening mammogram 02/07/2011  . Routine general medical examination at a health care facility 02/01/2011  . Vitamin D deficiency 04/14/2008  . Osteopenia 01/09/2007  . Hyperlipidemia with target LDL less than 130 01/04/2007  . VAGINITIS, ATROPHIC 01/04/2007  . SCOLIOSIS 01/04/2007  . CHRONIC FATIGUE SYNDROME 01/04/2007   Past Medical History  Diagnosis Date  . Hyperlipidemia   . Osteopenia   . Chronic fatigue syndrome   . S/P cervical polypectomy    Past Surgical History  Procedure Laterality  Date  . Abdominal hysterectomy      one ovary removed/ bleeding  . Dilation and curettage of uterus     Social History  Substance Use Topics  . Smoking status: Never Smoker   . Smokeless tobacco: None  . Alcohol Use: No   Family History  Problem Relation Age of Onset  . Stroke Mother   . Hypertension Father   . Stroke Father   . Hyperlipidemia Sister   . Hyperlipidemia Brother   . Cancer Paternal Uncle   . Cancer Maternal Grandmother     intestinal CA  . Cancer Paternal Grandfather     CA unsure which kind  . Hyperlipidemia Brother   . Heart attack Sister     Thought to be silent MI   Allergies  Allergen  Reactions  . Alendronate Sodium     REACTION: GI  . Amoxicillin     REACTION: vomiting  . Ibuprofen     REACTION: rash  . Ivp Dye [Iodinated Diagnostic Agents]    Current Outpatient Prescriptions on File Prior to Visit  Medication Sig Dispense Refill  . Calcium Carbonate (CALTRATE 600 PO) Take 2 tablets by mouth daily.      . Cholecalciferol (VITAMIN D) 2000 UNITS CAPS Take 1 capsule by mouth daily.      . simvastatin (ZOCOR) 40 MG tablet Take 1 tablet (40 mg total) by mouth at bedtime. 30 tablet 11   No current facility-administered medications on file prior to visit.    Review of Systems Review of Systems  Constitutional: Negative for fever, appetite change, fatigue and unexpected weight change.  Eyes: Negative for pain and visual disturbance.  Respiratory: Negative for cough and shortness of breath.   Cardiovascular: Negative for cp or palpitations    Gastrointestinal: Negative for  diarrhea and constipation. neg for blood in stool or dark stool, pos for abd pain mid/L upper  Genitourinary: Negative for urgency and frequency.  Skin: Negative for pallor or rash   Neurological: Negative for weakness, light-headedness, numbness and headaches.  Hematological: Negative for adenopathy. Does not bruise/bleed easily.  Psychiatric/Behavioral: Negative for dysphoric mood. The patient is not nervous/anxious.         Objective:   Physical Exam  Constitutional: She appears well-developed and well-nourished. No distress.  Well appearing   HENT:  Head: Normocephalic and atraumatic.  Mouth/Throat: Oropharynx is clear and moist.  Eyes: Conjunctivae and EOM are normal. Pupils are equal, round, and reactive to light. No scleral icterus.  Neck: Normal range of motion. Neck supple.  Cardiovascular: Normal rate, regular rhythm and normal heart sounds.   Pulmonary/Chest: Effort normal and breath sounds normal. No respiratory distress. She has no wheezes. She has no rales.  Abdominal: Soft.  Bowel sounds are normal. She exhibits no distension, no abdominal bruit, no pulsatile midline mass and no mass. There is no hepatosplenomegaly. There is tenderness in the epigastric area and left upper quadrant. There is no rebound, no guarding, no CVA tenderness, no tenderness at McBurney's point and negative Murphy's sign.  Musculoskeletal: She exhibits no edema.  Lymphadenopathy:    She has no cervical adenopathy.  Neurological: She is alert.  Skin: Skin is warm and dry. No erythema. No pallor.  No jaundice or pallor  Psychiatric: She has a normal mood and affect.          Assessment & Plan:   Problem List Items Addressed This Visit      Other   Dyspepsia    Only  initial imp with 20 of omeprazole  Now worse  See a/p for abd pain       Upper abdominal pain - Primary    Originally on R -now upper L and epigastric with nl labs and [place]  Also improved originally on omeprazole 20 and now worse  CT of abd ordered Will try stopping ppi in case this is a side eff (pt concern) - then if no imp inc to 40  Try stopping prilosec and if no improvement -then try taking 2 pills (40 mg) once daily  Stick with a bland diet  Stop at check out for referral for CT of the abdomen and pelvis   We would consider GI consult if nothing is found       Relevant Orders   CT Abdomen Pelvis Wo Contrast

## 2015-10-09 NOTE — Progress Notes (Signed)
Pre visit review using our clinic review tool, if applicable. No additional management support is needed unless otherwise documented below in the visit note. 

## 2015-10-11 NOTE — Assessment & Plan Note (Signed)
Originally on R -now upper L and epigastric with nl labs and [place]  Also improved originally on omeprazole 20 and now worse  CT of abd ordered Will try stopping ppi in case this is a side eff (pt concern) - then if no imp inc to 40  Try stopping prilosec and if no improvement -then try taking 2 pills (40 mg) once daily  Stick with a bland diet  Stop at check out for referral for CT of the abdomen and pelvis   We would consider GI consult if nothing is found

## 2015-10-11 NOTE — Assessment & Plan Note (Signed)
Only initial imp with 20 of omeprazole  Now worse  See a/p for abd pain

## 2015-10-13 ENCOUNTER — Ambulatory Visit (INDEPENDENT_AMBULATORY_CARE_PROVIDER_SITE_OTHER)
Admission: RE | Admit: 2015-10-13 | Discharge: 2015-10-13 | Disposition: A | Discharge status: Home / Self Care | Payer: BLUE CROSS/BLUE SHIELD | Source: Ambulatory Visit | Attending: Family Medicine | Admitting: Family Medicine

## 2015-10-13 DIAGNOSIS — R1010 Upper abdominal pain, unspecified: Secondary | ICD-10-CM | POA: Diagnosis not present

## 2015-10-14 ENCOUNTER — Encounter: Payer: Self-pay | Admitting: Family Medicine

## 2015-10-24 HISTORY — PX: ESOPHAGOGASTRODUODENOSCOPY: SHX1529

## 2015-10-28 ENCOUNTER — Encounter: Payer: Self-pay | Admitting: Family Medicine

## 2015-10-29 ENCOUNTER — Telehealth: Payer: Self-pay | Admitting: Family Medicine

## 2015-10-29 DIAGNOSIS — R1010 Upper abdominal pain, unspecified: Secondary | ICD-10-CM

## 2015-10-29 DIAGNOSIS — R1013 Epigastric pain: Secondary | ICD-10-CM

## 2015-10-29 NOTE — Telephone Encounter (Signed)
Ref to GI for abd pain  

## 2016-02-03 ENCOUNTER — Other Ambulatory Visit: Payer: Self-pay | Admitting: Family Medicine

## 2016-02-11 ENCOUNTER — Telehealth: Payer: Self-pay | Admitting: Family Medicine

## 2016-02-11 DIAGNOSIS — E559 Vitamin D deficiency, unspecified: Secondary | ICD-10-CM

## 2016-02-11 DIAGNOSIS — Z0000 Encounter for general adult medical examination without abnormal findings: Secondary | ICD-10-CM

## 2016-02-11 NOTE — Telephone Encounter (Signed)
-----   Message from Ellamae Sia sent at 02/10/2016  3:10 PM EDT ----- Regarding: Lab orders for Thursday, 7.27.17 Patient is scheduled for CPX labs, please order future labs, Thanks , Karna Christmas

## 2016-02-18 ENCOUNTER — Other Ambulatory Visit (INDEPENDENT_AMBULATORY_CARE_PROVIDER_SITE_OTHER): Payer: BLUE CROSS/BLUE SHIELD

## 2016-02-18 DIAGNOSIS — E559 Vitamin D deficiency, unspecified: Secondary | ICD-10-CM

## 2016-02-18 DIAGNOSIS — Z0000 Encounter for general adult medical examination without abnormal findings: Secondary | ICD-10-CM | POA: Diagnosis not present

## 2016-02-18 LAB — COMPREHENSIVE METABOLIC PANEL
ALK PHOS: 47 U/L (ref 39–117)
ALT: 18 U/L (ref 0–35)
AST: 26 U/L (ref 0–37)
Albumin: 4.2 g/dL (ref 3.5–5.2)
BILIRUBIN TOTAL: 0.7 mg/dL (ref 0.2–1.2)
BUN: 12 mg/dL (ref 6–23)
CO2: 30 mEq/L (ref 19–32)
Calcium: 9.4 mg/dL (ref 8.4–10.5)
Chloride: 106 mEq/L (ref 96–112)
Creatinine, Ser: 0.7 mg/dL (ref 0.40–1.20)
GFR: 89.47 mL/min (ref >60.00)
Glucose, Bld: 90 mg/dL (ref 70–99)
Potassium: 3.9 mEq/L (ref 3.5–5.1)
SODIUM: 142 meq/L (ref 135–145)
Total Protein: 6.7 g/dL (ref 6.0–8.3)

## 2016-02-18 LAB — CBC WITH DIFFERENTIAL/PLATELET
BASOS PCT: 0.6 % (ref 0.0–3.0)
Basophils Absolute: 0 10*3/uL (ref 0.0–0.1)
EOS ABS: 0.1 10*3/uL (ref 0.0–0.7)
EOS PCT: 2.2 % (ref 0.0–5.0)
HCT: 41.2 % (ref 36.0–46.0)
Hemoglobin: 13.8 g/dL (ref 12.0–15.0)
LYMPHS ABS: 2.5 10*3/uL (ref 0.7–4.0)
Lymphocytes Relative: 44.2 % (ref 12.0–46.0)
MCHC: 33.5 g/dL (ref 30.0–36.0)
MCV: 91.3 fl (ref 78.0–100.0)
MONO ABS: 0.6 10*3/uL (ref 0.1–1.0)
Monocytes Relative: 10.9 % (ref 3.0–12.0)
Neutro Abs: 2.4 10*3/uL (ref 1.4–7.7)
Neutrophils Relative %: 42.1 % — ABNORMAL LOW (ref 43.0–77.0)
PLATELETS: 196 10*3/uL (ref 150.0–400.0)
RBC: 4.51 Mil/uL (ref 3.87–5.11)
RDW: 14.2 % (ref 11.5–15.5)
WBC: 5.6 10*3/uL (ref 4.0–10.5)

## 2016-02-18 LAB — LIPID PANEL
Cholesterol: 166 mg/dL (ref 0–200)
HDL: 67.4 mg/dL (ref >39.00)
LDL Cholesterol: 82 mg/dL (ref 0–99)
NONHDL: 98.36
TRIGLYCERIDES: 83 mg/dL (ref 0.0–149.0)
Total CHOL/HDL Ratio: 2
VLDL: 16.6 mg/dL (ref 0.0–40.0)

## 2016-02-18 LAB — TSH: TSH: 1.85 u[IU]/mL (ref 0.35–4.50)

## 2016-02-18 LAB — VITAMIN D 25 HYDROXY (VIT D DEFICIENCY, FRACTURES): VITD: 57.71 ng/mL (ref 30.00–100.00)

## 2016-02-22 ENCOUNTER — Ambulatory Visit (INDEPENDENT_AMBULATORY_CARE_PROVIDER_SITE_OTHER): Payer: BLUE CROSS/BLUE SHIELD | Admitting: Family Medicine

## 2016-02-22 ENCOUNTER — Other Ambulatory Visit: Payer: BLUE CROSS/BLUE SHIELD

## 2016-02-22 ENCOUNTER — Encounter: Payer: Self-pay | Admitting: Family Medicine

## 2016-02-22 VITALS — BP 102/68 | HR 53 | Temp 98.3°F | Ht 62.5 in | Wt 112.2 lb

## 2016-02-22 DIAGNOSIS — E559 Vitamin D deficiency, unspecified: Secondary | ICD-10-CM | POA: Diagnosis not present

## 2016-02-22 DIAGNOSIS — M8580 Other specified disorders of bone density and structure, unspecified site: Secondary | ICD-10-CM | POA: Diagnosis not present

## 2016-02-22 DIAGNOSIS — Z0000 Encounter for general adult medical examination without abnormal findings: Secondary | ICD-10-CM | POA: Diagnosis not present

## 2016-02-22 DIAGNOSIS — E785 Hyperlipidemia, unspecified: Secondary | ICD-10-CM | POA: Diagnosis not present

## 2016-02-22 MED ORDER — SIMVASTATIN 40 MG PO TABS: ORAL_TABLET | ORAL | 3 refills | Status: DC

## 2016-02-22 NOTE — Assessment & Plan Note (Addendum)
Reviewed health habits including diet and exercise and skin cancer prevention Reviewed appropriate screening tests for age  Also reviewed health mt list, fam hx and immunization status , as well as social and family history   See HPI Labs reviewed  Don't forget to schedule your annual mammogram  Labs look good  Work on regular meals/eating to [employer]

## 2016-02-22 NOTE — Patient Instructions (Addendum)
Don't forget to schedule your annual mammogram  Labs look good  Work on regular meals/eating to gain/maintain weight

## 2016-02-22 NOTE — Progress Notes (Signed)
Subjective:    Patient ID: Tracy [name], female    DOB: 1950/08/13, 65 y.o.   MRN: [id number]  HPI Here for health maintenance exam and to review chronic medical problems    Just moved -now settled  From [place] to [facility]  Did downsize   Wt Readings from Last 3 Encounters:  02/22/16 112 lb 4 oz (50.9 kg)  10/09/15 123 lb 8 oz (56 kg)  09/01/15 123 lb 4 oz (55.9 kg)  down 11 lb  Had a lot of problems with stomach/gastritis -now finally off her medicines (was on omeprazole and carafate)- could not eat with those  Just finished med July 10th  Now appetite is coming back  Symptoms are gone  ? What caused it to begin with  bmi is 20.2   Hep C screen HIV screening Declines both due to low risk   Pap 6.07 Has had a hysterectomy No gyn symptoms   Flu shot 10/16  TD 6/08  Mammogram 8/16 normal-got a letter to schedule [facility]  Self breast exam - no lumps or changes    Colonoscopy 9/15-normal with 10 year recall   Zoster vaccine 7/13  dexa 10/15-stable osteopenia  Vit D level is 57  No falls No adult fractures   Hx of hyperlipidemia Lab Results  Component Value Date   CHOL 166 02/18/2016   CHOL 173 02/13/2015   CHOL 170 02/10/2014   Lab Results  Component Value Date   HDL 67.40 02/18/2016   HDL 58.10 02/13/2015   HDL 67.80 02/10/2014   Lab Results  Component Value Date   LDLCALC 82 02/18/2016   LDLCALC 102 (H) 02/13/2015   LDLCALC 85 02/10/2014   Lab Results  Component Value Date   TRIG 83.0 02/18/2016   TRIG 64.0 02/13/2015   TRIG 88.0 02/10/2014   Lab Results  Component Value Date   CHOLHDL 2 02/18/2016   CHOLHDL 3 02/13/2015   CHOLHDL 3 02/10/2014   Lab Results  Component Value Date   LDLDIRECT 172.6 05/20/2013   Simvastatin and diet  HDL is up and LDL is low   Results for orders placed or performed in visit on 02/18/16  CBC with Differential/Platelet  Result Value Ref Range   WBC 5.6 4.0 - 10.5 K/uL   RBC 4.51 3.87 - 5.11  Mil/uL   Hemoglobin 13.8 12.0 - 15.0 g/dL   HCT 41.2 36.0 - 46.0 %   MCV 91.3 78.0 - 100.0 fl   MCHC 33.5 30.0 - 36.0 g/dL   RDW 14.2 11.5 - 15.5 %   Platelets 196.0 150.0 - 400.0 K/uL   Neutrophils Relative % 42.1 (L) 43.0 - 77.0 %   Lymphocytes Relative 44.2 12.0 - 46.0 %   Monocytes Relative 10.9 3.0 - 12.0 %   Eosinophils Relative 2.2 0.0 - 5.0 %   Basophils Relative 0.6 0.0 - 3.0 %   Neutro Abs 2.4 1.4 - 7.7 K/uL   Lymphs Abs 2.5 0.7 - 4.0 K/uL   Monocytes Absolute 0.6 0.1 - 1.0 K/uL   Eosinophils Absolute 0.1 0.0 - 0.7 K/uL   Basophils Absolute 0.0 0.0 - 0.1 K/uL  Comprehensive metabolic panel  Result Value Ref Range   Sodium 142 135 - 145 mEq/L   Potassium 3.9 3.5 - 5.1 mEq/L   Chloride 106 96 - 112 mEq/L   CO2 30 19 - 32 mEq/L   Glucose, Bld 90 70 - 99 mg/dL   BUN 12 6 - 23 mg/dL  Creatinine, Ser 0.70 0.40 - 1.20 mg/dL   Total Bilirubin 0.7 0.2 - 1.2 mg/dL   Alkaline Phosphatase 47 39 - 117 U/L   AST 26 0 - 37 U/L   ALT 18 0 - 35 U/L   Total Protein 6.7 6.0 - 8.3 g/dL   Albumin 4.2 3.5 - 5.2 g/dL   Calcium 9.4 8.4 - 10.5 mg/dL   GFR 89.47 >60.00 mL/min  Lipid panel  Result Value Ref Range   Cholesterol 166 0 - 200 mg/dL   Triglycerides 83.0 0.0 - 149.0 mg/dL   HDL 67.40 >39.00 mg/dL   VLDL 16.6 0.0 - 40.0 mg/dL   LDL Cholesterol 82 0 - 99 mg/dL   Total CHOL/HDL Ratio 2    NonHDL 98.36   TSH  Result Value Ref Range   TSH 1.85 0.35 - 4.50 uIU/mL  VITAMIN D 25 Hydroxy (Vit-D Deficiency, Fractures)  Result Value Ref Range   VITD 57.71 30.00 - 100.00 ng/mL     Patient Active Problem List   Diagnosis Date Noted  . Chest pain with moderate risk for cardiac etiology 10/17/2014  . Colon cancer screening 02/17/2014  . Other screening mammogram 02/07/2011  . Routine general medical examination at a health care facility 02/01/2011  . Vitamin D deficiency 04/14/2008  . Osteopenia 01/09/2007  . Hyperlipidemia with target LDL less than 130 01/04/2007  .  VAGINITIS, ATROPHIC 01/04/2007  . SCOLIOSIS 01/04/2007  . CHRONIC FATIGUE SYNDROME 01/04/2007   Past Medical History:  Diagnosis Date  . Chronic fatigue syndrome   . Hyperlipidemia   . Osteopenia   . S/P cervical polypectomy    Past Surgical History:  Procedure Laterality Date  . ABDOMINAL HYSTERECTOMY     one ovary removed/ bleeding  . DILATION AND CURETTAGE OF UTERUS     Social History  Substance Use Topics  . Smoking status: Never Smoker  . Smokeless tobacco: Never Used  . Alcohol use No   Family History  Problem Relation Age of Onset  . Stroke Mother   . Hypertension Father   . Stroke Father   . Hyperlipidemia Sister   . Hyperlipidemia Brother   . Cancer Paternal Uncle   . Cancer Maternal Grandmother     intestinal CA  . Cancer Paternal Grandfather     CA unsure which kind  . Hyperlipidemia Brother   . Heart attack Sister     Thought to be silent MI   Allergies  Allergen Reactions  . Alendronate Sodium     REACTION: GI  . Amoxicillin     REACTION: vomiting  . Ibuprofen     REACTION: rash  . Ivp Dye [Iodinated Diagnostic Agents]    Current Outpatient Prescriptions on File Prior to Visit  Medication Sig Dispense Refill  . Calcium Carbonate (CALTRATE 600 PO) Take 2 tablets by mouth daily.      . Cholecalciferol (VITAMIN D) 2000 UNITS CAPS Take 1 capsule by mouth daily.       No current facility-administered medications on file prior to visit.     Review of Systems    Review of Systems  Constitutional: Negative for fever, appetite change,  and unexpected weight change.  Eyes: Negative for pain and visual disturbance.  Respiratory: Negative for cough and shortness of breath.   Cardiovascular: Negative for cp or palpitations    Gastrointestinal: Negative for nausea, diarrhea and constipation.  Genitourinary: Negative for urgency and frequency.  Skin: Negative for pallor or rash   Neurological: Negative for  weakness, light-headedness, numbness and  headaches.  Hematological: Negative for adenopathy. Does not bruise/bleed easily.  Psychiatric/Behavioral: Negative for dysphoric mood. The patient is not nervous/anxious.      Objective:   Physical Exam  Constitutional: She appears well-developed and well-nourished. No distress.  HENT:  Head: Normocephalic and atraumatic.  Right Ear: External ear normal.  Left Ear: External ear normal.  Mouth/Throat: Oropharynx is clear and moist.  Eyes: Conjunctivae and EOM are normal. Pupils are equal, round, and reactive to light. No scleral icterus.  Neck: Normal range of motion. Neck supple. No JVD present. Carotid bruit is not present. No thyromegaly present.  Cardiovascular: Normal rate, regular rhythm, normal heart sounds and intact distal pulses.  Exam reveals no gallop.   Pulmonary/Chest: Effort normal and breath sounds normal. No respiratory distress. She has no wheezes. She exhibits no tenderness.  Abdominal: Soft. Bowel sounds are normal. She exhibits no distension, no abdominal bruit and no mass. There is no tenderness.  Genitourinary: No breast swelling, tenderness, discharge or bleeding.  Genitourinary Comments: Breast exam: No mass, nodules, thickening, tenderness, bulging, retraction, inflamation, nipple discharge or skin changes noted.  No axillary or clavicular LA.      Musculoskeletal: Normal range of motion. She exhibits no edema or tenderness.  Mild scoliosis   Lymphadenopathy:    She has no cervical adenopathy.  Neurological: She is alert. She has normal reflexes. No cranial nerve deficit. She exhibits normal muscle tone. Coordination normal.  Skin: Skin is warm and dry. No rash noted. No erythema. No pallor.  Psychiatric: She has a normal mood and affect.          Assessment & Plan:   Problem List Items Addressed This Visit      Musculoskeletal and Integument   Osteopenia    Due for dexa in Nov-will call back to schedule  No falls or fx D level is ok  Disc need for  calcium/ vitamin D/ wt bearing exercise and bone density test every 2 y to monitor Disc safety/ fracture risk in detail          Other   Vitamin D deficiency    Vitamin D level is therapeutic with current supplementation Disc importance of this to bone and overall health       Routine general medical examination at a health care facility - Primary    Reviewed health habits including diet and exercise and skin cancer prevention Reviewed appropriate screening tests for age  Also reviewed health mt list, fam hx and immunization status , as well as social and family history   See HPI Labs reviewed  Don't forget to schedule your annual mammogram  Labs look good  Work on regular meals/eating to gain/maintain weight        Hyperlipidemia with target LDL less than 130 (Chronic)    Disc goals for lipids and reasons to control them Rev labs with pt Rev low sat fat diet in detail Well controlled with simvastatin and diet       Relevant Medications   simvastatin (ZOCOR) 40 MG tablet    Other Visit Diagnoses   None.

## 2016-02-22 NOTE — Assessment & Plan Note (Signed)
Disc goals for lipids and reasons to control them Rev labs with pt Rev low sat fat diet in detail  Well controlled with simvastatin and diet  

## 2016-02-22 NOTE — Assessment & Plan Note (Signed)
Vitamin D level is therapeutic with current supplementation Disc importance of this to bone and overall health  

## 2016-02-22 NOTE — Progress Notes (Signed)
Pre visit review using our clinic review tool, if applicable. No additional management support is needed unless otherwise documented below in the visit note. 

## 2016-02-22 NOTE — Assessment & Plan Note (Signed)
Due for dexa in Nov-will call back to schedule  No falls or fx D level is ok  Disc need for calcium/ vitamin D/ wt bearing exercise and bone density test every 2 y to monitor Disc safety/ fracture risk in detail

## 2016-02-23 ENCOUNTER — Encounter: Payer: BLUE CROSS/BLUE SHIELD | Admitting: Family Medicine

## 2016-02-25 ENCOUNTER — Other Ambulatory Visit: Payer: Self-pay | Admitting: Family Medicine

## 2016-02-25 DIAGNOSIS — Z1231 Encounter for screening mammogram for malignant neoplasm of breast: Secondary | ICD-10-CM

## 2016-03-21 ENCOUNTER — Ambulatory Visit
Admission: RE | Admit: 2016-03-21 | Discharge: 2016-03-21 | Disposition: A | Discharge status: Home / Self Care | Payer: BLUE CROSS/BLUE SHIELD | Source: Ambulatory Visit | Attending: Family Medicine | Admitting: Family Medicine

## 2016-03-21 ENCOUNTER — Other Ambulatory Visit: Payer: Self-pay | Admitting: Family Medicine

## 2016-03-21 DIAGNOSIS — Z1231 Encounter for screening mammogram for malignant neoplasm of breast: Secondary | ICD-10-CM | POA: Diagnosis present

## 2016-08-23 ENCOUNTER — Encounter: Payer: Self-pay | Admitting: Family Medicine

## 2016-08-23 ENCOUNTER — Ambulatory Visit (INDEPENDENT_AMBULATORY_CARE_PROVIDER_SITE_OTHER): Payer: BLUE CROSS/BLUE SHIELD | Admitting: Family Medicine

## 2016-08-23 VITALS — BP 110/68 | HR 66 | Temp 99.0°F | Ht 62.5 in | Wt 111.2 lb

## 2016-08-23 DIAGNOSIS — J111 Influenza due to unidentified influenza virus with other respiratory manifestations: Secondary | ICD-10-CM

## 2016-08-23 MED ORDER — BENZONATATE 200 MG PO CAPS: 200.0000 mg | ORAL_CAPSULE | Freq: Three times a day (TID) | ORAL | 1 refills | Status: DC | PRN

## 2016-08-23 MED ORDER — OSELTAMIVIR PHOSPHATE 75 MG PO CAPS: 75.0000 mg | ORAL_CAPSULE | Freq: Two times a day (BID) | ORAL | 0 refills | Status: DC

## 2016-08-23 NOTE — Progress Notes (Signed)
Pre visit review using our clinic review tool, if applicable. No additional management support is needed unless otherwise documented below in the visit note. 

## 2016-08-23 NOTE — Patient Instructions (Addendum)
I think you have the flu  Rest and drink fluids Wear a mask when you are around other people  Tylenol as directed for fever is going to help a lot  mucinex DM for cough for congestion   Do get a flu shot when you are feeling better    Update if not starting to improve in a week or if worsening        Influenza, Adult Influenza, more commonly known as "the flu," is a viral infection that primarily affects the respiratory tract. The respiratory tract includes organs that help you breathe, such as the lungs, nose, and throat. The flu causes many common cold symptoms, as well as a high fever and body aches. The flu spreads easily from person to person (is contagious). Getting a flu shot (influenza vaccination) every year is the best way to prevent influenza. What are the causes? Influenza is caused by a virus. You can catch the virus by:  Breathing in droplets from an infected person's cough or sneeze.  Touching something that was recently contaminated with the virus and then touching your mouth, nose, or eyes. What increases the risk? The following factors may make you more likely to get the flu:  Not cleaning your hands frequently with soap and water or alcohol-based hand sanitizer.  Having close contact with many people during cold and flu season.  Touching your mouth, eyes, or nose without washing or sanitizing your hands first.  Not drinking enough fluids or not eating a healthy diet.  Not getting enough sleep or exercise.  Being under a high amount of stress.  Not getting a yearly (annual) flu shot. You may be at a higher risk of complications from the flu, such as a severe lung infection (pneumonia), if you:  Are over the age of 40.  Are pregnant.  Have a weakened disease-fighting system (immune system). You may have a weakened immune system if you:  Have HIV or AIDS.  Are undergoing chemotherapy.  Aretaking medicines that reduce the activity of (suppress) the  immune system.  Have a long-term (chronic) illness, such as heart disease, kidney disease, diabetes, or lung disease.  Have a liver disorder.  Are obese.  Have anemia. What are the signs or symptoms? Symptoms of this condition typically last 4-10 days and may include:  Fever.  Chills.  Headache, body aches, or muscle aches.  Sore throat.  Cough.  Runny or congested nose.  Chest discomfort and cough.  Poor appetite.  Weakness or tiredness (fatigue).  Dizziness.  Nausea or vomiting. How is this diagnosed? This condition may be diagnosed based on your medical history and a physical exam. Your health care provider may do a nose or throat swab test to confirm the diagnosis. How is this treated? If influenza is detected early, you can be treated with antiviral medicine that can reduce the length of your illness and the severity of your symptoms. This medicine may be given by mouth (orally) or through an IV tube that is inserted in one of your veins. The goal of treatment is to relieve symptoms by taking care of yourself at home. This may include taking over-the-counter medicines, drinking plenty of fluids, and adding humidity to the air in your home. In some cases, influenza goes away on its own. Severe influenza or complications from influenza may be treated in a hospital. Follow these instructions at home:  Take over-the-counter and prescription medicines only as told by your health care provider.  Use a  cool mist humidifier to add humidity to the air in your home. This can make breathing easier.  Rest as needed.  Drink enough fluid to keep your urine clear or pale yellow.  Cover your mouth and nose when you cough or sneeze.  Wash your hands with soap and water often, especially after you cough or sneeze. If soap and water are not available, use hand sanitizer.  Stay home from work or school as told by your health care provider. Unless you are visiting your health  care provider, try to avoid leaving home until your fever has been gone for 24 hours without the use of medicine.  Keep all follow-up visits as told by your health care provider. This is important. How is this prevented?  Getting an annual flu shot is the best way to avoid getting the flu. You may get the flu shot in late summer, fall, or winter. Ask your health care provider when you should get your flu shot.  Wash your hands often or use hand sanitizer often.  Avoid contact with people who are sick during cold and flu season.  Eat a healthy diet, drink plenty of fluids, get enough sleep, and exercise regularly. Contact a health care provider if:  You develop new symptoms.  You have:  Chest pain.  Diarrhea.  A fever.  Your cough gets worse.  You produce more mucus.  You feel nauseous or you vomit. Get help right away if:  You develop shortness of breath or difficulty breathing.  Your skin or nails turn a bluish color.  You have severe pain or stiffness in your neck.  You develop a sudden headache or sudden pain in your face or ear.  You cannot stop vomiting. This information is not intended to replace advice given to you by your health care provider. Make sure you discuss any questions you have with your health care provider. Document Released: 07/08/2000 Document Revised: 12/17/2015 Document Reviewed: 05/05/2015 Elsevier Interactive Patient Education  2017 [employer].

## 2016-08-23 NOTE — Assessment & Plan Note (Signed)
With cough Levie Heritage /and one episode of vomiting Re assuring exam Disc symptomatic care - see instructions on AVS  mucinex DM and tessalon for cough  tamiflu px 75 mg bid for 5 d  Disc mask and prev transmission  Update if not starting to improve in a week or if worsening

## 2016-08-23 NOTE — Progress Notes (Signed)
Subjective:    Patient ID: Tracy [name], female    DOB: Aug 20, 1950, 66 y.o.   MRN: [id number]  HPI Here for uri symptoms with temp of 100.0 this am   She did not get a flu shot this season -got too busy (she usually gets them)  Started symptoms Sunday night with runny nose and then chills and low grade temp elevation  Throat hurt last night -improved now  Productive cough - green sputum  101 temp this am  Got really hot in the shower and got dizzy   Some slight headache off and on  No sinus pain   Vomited yesterday am - just once due to nausea   Not taking anything otc now  One dose of tylenol Sunday night   Temp: 99 F (37.2 C)   Patient Active Problem List   Diagnosis Date Noted  . Influenza 08/23/2016  . Chest pain with moderate risk for cardiac etiology 10/17/2014  . Colon cancer screening 02/17/2014  . Other screening mammogram 02/07/2011  . Routine general medical examination at a health care facility 02/01/2011  . Vitamin D deficiency 04/14/2008  . Osteopenia 01/09/2007  . Hyperlipidemia with target LDL less than 130 01/04/2007  . VAGINITIS, ATROPHIC 01/04/2007  . SCOLIOSIS 01/04/2007  . CHRONIC FATIGUE SYNDROME 01/04/2007   Past Medical History:  Diagnosis Date  . Chronic fatigue syndrome   . Hyperlipidemia   . Osteopenia   . S/P cervical polypectomy    Past Surgical History:  Procedure Laterality Date  . ABDOMINAL HYSTERECTOMY     one ovary removed/ bleeding  . DILATION AND CURETTAGE OF UTERUS     Social History  Substance Use Topics  . Smoking status: Never Smoker  . Smokeless tobacco: Never Used  . Alcohol use No   Family History  Problem Relation Age of Onset  . Stroke Mother   . Hypertension Father   . Stroke Father   . Hyperlipidemia Sister   . Hyperlipidemia Brother   . Cancer Paternal Uncle   . Cancer Maternal Grandmother     intestinal CA  . Cancer Paternal Grandfather     CA unsure which kind  . Hyperlipidemia Brother     . Heart attack Sister     Thought to be silent MI  . Breast cancer Neg Hx    Allergies  Allergen Reactions  . Alendronate Sodium     REACTION: GI  . Amoxicillin     REACTION: vomiting  . Ibuprofen     REACTION: rash  . Ivp Dye [Iodinated Diagnostic Agents]    Current Outpatient Prescriptions on File Prior to Visit  Medication Sig Dispense Refill  . Calcium Carbonate (CALTRATE 600 PO) Take 2 tablets by mouth daily.      . Cholecalciferol (VITAMIN D) 2000 UNITS CAPS Take 1 capsule by mouth daily.      . simvastatin (ZOCOR) 40 MG tablet TAKE (1) TABLET BY MOUTH DAILY AT BEDTIME 90 tablet 3   No current facility-administered medications on file prior to visit.     Review of Systems  Constitutional: Positive for appetite change, chills, diaphoresis and fatigue. Negative for fever.  HENT: Positive for congestion, postnasal drip, rhinorrhea, sinus pressure, sneezing and sore throat. Negative for ear pain.   Eyes: Negative for pain and discharge.  Respiratory: Positive for cough. Negative for shortness of breath, wheezing and stridor.   Cardiovascular: Negative for chest pain.  Gastrointestinal: Positive for nausea and vomiting. Negative for diarrhea.  Genitourinary:  Negative for frequency, hematuria and urgency.  Musculoskeletal: Negative for arthralgias and myalgias.  Skin: Negative for rash.  Neurological: Positive for headaches. Negative for dizziness, weakness and light-headedness.  Psychiatric/Behavioral: Negative for confusion and dysphoric mood.       Objective:   Physical Exam  Constitutional: She appears well-developed and well-nourished. No distress.  Fatigued appearing   HENT:  Head: Normocephalic and atraumatic.  Right Ear: External ear normal.  Left Ear: External ear normal.  Mouth/Throat: Oropharynx is clear and moist.  Nares are injected and congested  No sinus tenderness Clear rhinorrhea and post nasal drip   Eyes: Conjunctivae and EOM are normal. Pupils  are equal, round, and reactive to light. Right eye exhibits no discharge. Left eye exhibits no discharge.  Neck: Normal range of motion. Neck supple.  Cardiovascular: Normal rate and normal heart sounds.   Pulmonary/Chest: Effort normal and breath sounds normal. No respiratory distress. She has no wheezes. She has no rales. She exhibits no tenderness.  Harsh bs No rales or rhonchi Good air exch  No wheeze even on forced exp  Lymphadenopathy:    She has no cervical adenopathy.  Neurological: She is alert.  Skin: Skin is warm and dry. No rash noted.  Psychiatric: She has a normal mood and affect.          Assessment & Plan:   Problem List Items Addressed This Visit      Respiratory   Influenza    With cough Levie Heritage /and one episode of vomiting Re assuring exam Disc symptomatic care - see instructions on AVS  mucinex DM and tessalon for cough  tamiflu px 75 mg bid for 5 d  Disc mask and prev transmission  Update if not starting to improve in a week or if worsening          Relevant Medications   oseltamivir (TAMIFLU) 75 MG capsule

## 2016-09-05 ENCOUNTER — Telehealth: Payer: Self-pay

## 2016-09-05 NOTE — Telephone Encounter (Signed)
Pt notified of Dr. Marliss Coots comment pt said she is feeling better so nurse visit scheduled for flu vaccine

## 2016-09-05 NOTE — Telephone Encounter (Signed)
Pt left v/m; pt was seen 08/23/16 with the flu; pt wants to know how soon after having the flu should pt wait to get the flu shot. Pt request cb.

## 2016-09-05 NOTE — Telephone Encounter (Signed)
When she feels better and no longer has a fever it is ok

## 2016-09-08 ENCOUNTER — Ambulatory Visit (INDEPENDENT_AMBULATORY_CARE_PROVIDER_SITE_OTHER): Payer: BLUE CROSS/BLUE SHIELD

## 2016-09-08 DIAGNOSIS — Z23 Encounter for immunization: Secondary | ICD-10-CM | POA: Diagnosis not present

## 2016-10-28 ENCOUNTER — Telehealth: Payer: Self-pay | Admitting: Family Medicine

## 2016-10-28 NOTE — Telephone Encounter (Signed)
I called pt and gave appt with Dr Glori Bickers on 10/31/16 at 11:45. If pt condition changes or worsens prior to appt pt can call [facility] or go to ED if at night. Pt voiced understanding.

## 2016-10-28 NOTE — Telephone Encounter (Signed)
Patient Name: Tracy [name]  DOB: 04/05/1951    Initial Comment Caller states that she was dx with chronic fatigue 20 yrs ago. She states that she got the the flu at the end of January. She feels like her body is not all the way recovered and states that she has been having episodes of extreme fatigue. She states that Sunday she was sitting at the table and felt like all the blood drained out of her face. her body temp also goes low as well. She states that she has been dealing with this a while, but states that the episode Sunday has raised concerns.    Nurse Assessment  Nurse: Mallie Mussel, RN, Alveta Heimlich Date/Time Eilene Ghazi Time): 10/28/2016 10:57:14 AM  Confirm and document reason for call. If symptomatic, describe symptoms. ---Caller states that she has been having worsening of her fatigue since she had the flu. She did feel better, but then she began to feel worse. She is able to walk back and forth to the bathroom. She denies difficulty breathing.  Does the patient have any new or worsening symptoms? ---Yes  Will a triage be completed? ---Yes  Related visit to physician within the last 2 weeks? ---No  Does the PT have any chronic conditions? (i.e. diabetes, asthma, etc.) ---Yes  List chronic conditions. ---Chronic Fatigue, Hypercholesterolemia  Is this a behavioral health or substance abuse call? ---No     Guidelines    Guideline Title Affirmed Question Affirmed Notes  Weakness (Generalized) and Fatigue [1] MODERATE weakness (i.e., interferes with work, school, normal activities) AND [2] persists > 3 days    Final Disposition User   See Physician within [place], Therapist, sports, Alveta Heimlich    Comments  No appointments were available at [facility] for the rest of the day. I was about to check [place], but she wants to wait until Monday to be seen. She would like to see Dr. Glori Bickers. I advised her that I will forward her request to the office and someone will be calling her back since she will not be seen within the  specified time frame. She verbalized understanding.   Referrals  REFERRED TO PCP OFFICE   Disagree/Comply: Comply

## 2016-10-31 ENCOUNTER — Encounter: Payer: Self-pay | Admitting: Family Medicine

## 2016-10-31 ENCOUNTER — Ambulatory Visit (INDEPENDENT_AMBULATORY_CARE_PROVIDER_SITE_OTHER): Payer: Medicare Other | Admitting: Family Medicine

## 2016-10-31 VITALS — BP 104/58 | HR 60 | Temp 98.2°F | Ht 62.5 in | Wt 111.8 lb

## 2016-10-31 DIAGNOSIS — E538 Deficiency of other specified B group vitamins: Secondary | ICD-10-CM | POA: Insufficient documentation

## 2016-10-31 DIAGNOSIS — R031 Nonspecific low blood-pressure reading: Secondary | ICD-10-CM | POA: Diagnosis not present

## 2016-10-31 DIAGNOSIS — G9331 Postviral fatigue syndrome: Secondary | ICD-10-CM | POA: Insufficient documentation

## 2016-10-31 DIAGNOSIS — E559 Vitamin D deficiency, unspecified: Secondary | ICD-10-CM

## 2016-10-31 DIAGNOSIS — R5382 Chronic fatigue, unspecified: Secondary | ICD-10-CM

## 2016-10-31 DIAGNOSIS — G933 Postviral fatigue syndrome: Secondary | ICD-10-CM

## 2016-10-31 DIAGNOSIS — G9332 Myalgic encephalomyelitis/chronic fatigue syndrome: Secondary | ICD-10-CM

## 2016-10-31 LAB — CBC WITH DIFFERENTIAL/PLATELET
Basophils Absolute: 0.1 10*3/uL (ref 0.0–0.1)
Basophils Relative: 1.2 % (ref 0.0–3.0)
EOS ABS: 0.1 10*3/uL (ref 0.0–0.7)
EOS PCT: 1.5 % (ref 0.0–5.0)
HCT: 42 % (ref 36.0–46.0)
Hemoglobin: 14.2 g/dL (ref 12.0–15.0)
LYMPHS ABS: 2.7 10*3/uL (ref 0.7–4.0)
Lymphocytes Relative: 45.5 % (ref 12.0–46.0)
MCHC: 33.8 g/dL (ref 30.0–36.0)
MCV: 91.7 fl (ref 78.0–100.0)
MONO ABS: 0.5 10*3/uL (ref 0.1–1.0)
Monocytes Relative: 8.6 % (ref 3.0–12.0)
NEUTROS PCT: 43.2 % (ref 43.0–77.0)
Neutro Abs: 2.5 10*3/uL (ref 1.4–7.7)
Platelets: 184 10*3/uL (ref 150.0–400.0)
RBC: 4.59 Mil/uL (ref 3.87–5.11)
RDW: 14 % (ref 11.5–15.5)
WBC: 5.9 10*3/uL (ref 4.0–10.5)

## 2016-10-31 LAB — COMPREHENSIVE METABOLIC PANEL
ALT: 18 U/L (ref 0–35)
AST: 23 U/L (ref 0–37)
Albumin: 4.3 g/dL (ref 3.5–5.2)
Alkaline Phosphatase: 44 U/L (ref 39–117)
BUN: 9 mg/dL (ref 6–23)
CHLORIDE: 104 meq/L (ref 96–112)
CO2: 31 mEq/L (ref 19–32)
Calcium: 9.9 mg/dL (ref 8.4–10.5)
Creatinine, Ser: 0.69 mg/dL (ref 0.40–1.20)
GFR: 90.77 mL/min (ref >60.00)
GLUCOSE: 88 mg/dL (ref 70–99)
POTASSIUM: 4.8 meq/L (ref 3.5–5.1)
SODIUM: 141 meq/L (ref 135–145)
Total Bilirubin: 0.7 mg/dL (ref 0.2–1.2)
Total Protein: 6.8 g/dL (ref 6.0–8.3)

## 2016-10-31 LAB — VITAMIN D 25 HYDROXY (VIT D DEFICIENCY, FRACTURES): VITD: 55.45 ng/mL (ref 30.00–100.00)

## 2016-10-31 LAB — TSH: TSH: 2.13 u[IU]/mL (ref 0.35–4.50)

## 2016-10-31 LAB — VITAMIN B12: Vitamin B-12: 253 pg/mL (ref 211–911)

## 2016-10-31 NOTE — Progress Notes (Signed)
Subjective:    Patient ID: Tracy [name], female    DOB: [birth year], 66 y.o.   MRN: [id number]  HPI  Here for fatigue today  She had the flu at the end of January Started feeling better and got the flu vaccine after that   Never fully got her strength back  A week later- her fatigue "went downhill" Not as bad as when she was diag 31 y ago with chronic fatigue syndrome   Physically dragging  Her limbs feel heavy   Family insisted that she come  She had one episode of fatigue at the dinner table in which she felt like the blood drained out of her face  She did not faint -but felt faint  No nausea or sweaty or chest pain or other symptoms   Cold natured  Sometimes her temp is low normal  Temp: 98.2 F (36.8 C)   Not under a lot of stress  Takes care of her grand daughter- 29 yo / pretty easy and she does not feel stressed by that     BP Readings from Last 3 Encounters:  10/31/16 (!) 104/58  08/23/16 110/68  02/22/16 102/68    Wt Readings from Last 3 Encounters:  10/31/16 111 lb 12 oz (50.7 kg)  08/23/16 111 lb 4 oz (50.5 kg)  02/22/16 112 lb 4 oz (50.9 kg)   Wt is stable    Last labs in the summer Lab on 02/18/2016  Component Date Value Ref Range Status  . WBC 02/18/2016 5.6  4.0 - 10.5 K/uL Final  . RBC 02/18/2016 4.51  3.87 - 5.11 Mil/uL Final  . Hemoglobin 02/18/2016 13.8  12.0 - 15.0 g/dL Final  . HCT 02/18/2016 41.2  36.0 - 46.0 % Final  . MCV 02/18/2016 91.3  78.0 - 100.0 fl Final  . MCHC 02/18/2016 33.5  30.0 - 36.0 g/dL Final  . RDW 02/18/2016 14.2  11.5 - 15.5 % Final  . Platelets 02/18/2016 196.0  150.0 - 400.0 K/uL Final  . Neutrophils Relative % 02/18/2016 42.1* 43.0 - 77.0 % Final  . Lymphocytes Relative 02/18/2016 44.2  12.0 - 46.0 % Final  . Monocytes Relative 02/18/2016 10.9  3.0 - 12.0 % Final  . Eosinophils Relative 02/18/2016 2.2  0.0 - 5.0 % Final  . Basophils Relative 02/18/2016 0.6  0.0 - 3.0 % Final  . Neutro Abs 02/18/2016 2.4   1.4 - 7.7 K/uL Final  . Lymphs Abs 02/18/2016 2.5  0.7 - 4.0 K/uL Final  . Monocytes Absolute 02/18/2016 0.6  0.1 - 1.0 K/uL Final  . Eosinophils Absolute 02/18/2016 0.1  0.0 - 0.7 K/uL Final  . Basophils Absolute 02/18/2016 0.0  0.0 - 0.1 K/uL Final  . Sodium 02/18/2016 142  135 - 145 mEq/L Final  . Potassium 02/18/2016 3.9  3.5 - 5.1 mEq/L Final  . Chloride 02/18/2016 106  96 - 112 mEq/L Final  . CO2 02/18/2016 30  19 - 32 mEq/L Final  . Glucose, Bld 02/18/2016 90  70 - 99 mg/dL Final  . BUN 02/18/2016 12  6 - 23 mg/dL Final  . Creatinine, Ser 02/18/2016 0.70  0.40 - 1.20 mg/dL Final  . Total Bilirubin 02/18/2016 0.7  0.2 - 1.2 mg/dL Final  . Alkaline Phosphatase 02/18/2016 47  39 - 117 U/L Final  . AST 02/18/2016 26  0 - 37 U/L Final  . ALT 02/18/2016 18  0 - 35 U/L Final  . Total Protein 02/18/2016 6.7  6.0 - 8.3 g/dL Final  . Albumin 02/18/2016 4.2  3.5 - 5.2 g/dL Final  . Calcium 02/18/2016 9.4  8.4 - 10.5 mg/dL Final  . GFR 02/18/2016 89.47  >60.00 mL/min Final  . Cholesterol 02/18/2016 166  0 - 200 mg/dL Final  . Triglycerides 02/18/2016 83.0  0.0 - 149.0 mg/dL Final  . HDL 02/18/2016 67.40  >39.00 mg/dL Final  . VLDL 02/18/2016 16.6  0.0 - 40.0 mg/dL Final  . LDL Cholesterol 02/18/2016 82  0 - 99 mg/dL Final  . Total CHOL/HDL Ratio 02/18/2016 2   Final  . NonHDL 02/18/2016 98.36   Final  . TSH 02/18/2016 1.85  0.35 - 4.50 uIU/mL Final  . VITD 02/18/2016 57.71  30.00 - 100.00 ng/mL Final    Patient Active Problem List   Diagnosis Date Noted  . Borderline low blood pressure determined by examination 10/31/2016  . Post viral syndrome 10/31/2016  . Chest pain with moderate risk for cardiac etiology 10/17/2014  . Colon cancer screening 02/17/2014  . Other screening mammogram 02/07/2011  . Routine general medical examination at a health care facility 02/01/2011  . Vitamin D deficiency 04/14/2008  . Osteopenia 01/09/2007  . Hyperlipidemia with target LDL less than 130  01/04/2007  . VAGINITIS, ATROPHIC 01/04/2007  . SCOLIOSIS 01/04/2007  . CHRONIC FATIGUE SYNDROME 01/04/2007   Past Medical History:  Diagnosis Date  . Chronic fatigue syndrome   . Hyperlipidemia   . Osteopenia   . S/P cervical polypectomy    Past Surgical History:  Procedure Laterality Date  . ABDOMINAL HYSTERECTOMY     one ovary removed/ bleeding  . DILATION AND CURETTAGE OF UTERUS     Social History  Substance Use Topics  . Smoking status: Never Smoker  . Smokeless tobacco: Never Used  . Alcohol use No   Family History  Problem Relation Age of Onset  . Stroke Mother   . Hypertension Father   . Stroke Father   . Hyperlipidemia Sister   . Hyperlipidemia Brother   . Cancer Paternal Uncle   . Cancer Maternal Grandmother     intestinal CA  . Cancer Paternal Grandfather     CA unsure which kind  . Hyperlipidemia Brother   . Heart attack Sister     Thought to be silent MI  . Breast cancer Neg Hx    Allergies  Allergen Reactions  . Alendronate Sodium     REACTION: GI  . Amoxicillin     REACTION: vomiting  . Ibuprofen     REACTION: rash  . Ivp Dye [Iodinated Diagnostic Agents]    Current Outpatient Prescriptions on File Prior to Visit  Medication Sig Dispense Refill  . Calcium Carbonate (CALTRATE 600 PO) Take 2 tablets by mouth daily.      . Cholecalciferol (VITAMIN D) 2000 UNITS CAPS Take 1 capsule by mouth daily.      . simvastatin (ZOCOR) 40 MG tablet TAKE (1) TABLET BY MOUTH DAILY AT BEDTIME 90 tablet 3   No current facility-administered medications on file prior to visit.     Review of Systems Review of Systems  Constitutional: Negative for fever, appetite change, and unexpected weight change. pos for fatigue  Eyes: Negative for pain and visual disturbance.  ENT neg for snoring or excessive sleepiness  Respiratory: Negative for cough and shortness of breath.   Cardiovascular: Negative for cp or palpitations    Gastrointestinal: Negative for nausea,  diarrhea and constipation.  Genitourinary: Negative for urgency and frequency.  Skin: Negative for pallor or rash   Neurological: Negative for weakness, light-headedness, numbness and headaches.  Hematological: Negative for adenopathy. Does not bruise/bleed easily.  Psychiatric/Behavioral: Negative for dysphoric mood. The patient is not nervous/anxious.         Objective:   Physical Exam  Constitutional: She appears well-developed and well-nourished. No distress.  Well appearing   HENT:  Head: Normocephalic and atraumatic.  Right Ear: External ear normal.  Left Ear: External ear normal.  Nose: Nose normal.  Mouth/Throat: Oropharynx is clear and moist.  Eyes: Conjunctivae and EOM are normal. Pupils are equal, round, and reactive to light. Right eye exhibits no discharge. Left eye exhibits no discharge. No scleral icterus.  Neck: Normal range of motion. Neck supple. No JVD present. Carotid bruit is not present. No thyromegaly present.  Cardiovascular: Normal rate, regular rhythm, normal heart sounds and intact distal pulses.  Exam reveals no gallop.   Pulmonary/Chest: Effort normal and breath sounds normal. No respiratory distress. She has no wheezes. She has no rales.  Abdominal: Soft. Bowel sounds are normal. She exhibits no distension and no mass. There is no tenderness.  Musculoskeletal: She exhibits no edema or tenderness.  No acute joint changes   Baseline scoliosis   Lymphadenopathy:       Head (right side): No submental, no submandibular, no tonsillar, no preauricular, no posterior auricular and no occipital adenopathy present.       Head (left side): No submental, no submandibular, no tonsillar, no preauricular, no posterior auricular and no occipital adenopathy present.    She has no cervical adenopathy.       Right: No inguinal, no supraclavicular and no epitrochlear adenopathy present.       Left: No inguinal, no supraclavicular and no epitrochlear adenopathy present.    Neurological: She is alert. She has normal reflexes. No cranial nerve deficit. She exhibits normal muscle tone. Coordination normal.  Skin: Skin is warm and dry. No rash noted. No erythema. No pallor.  No rash or pallor   Psychiatric: She has a normal mood and affect.  Cheerful and talkative           Assessment & Plan:   Problem List Items Addressed This Visit      Cardiovascular and Mediastinum   Borderline low blood pressure determined by examination    With occ feeling of dizziness but no syncope Disc app intake of fluids and sodium  Warned not to change position quickly  Will continue to monitor        Other   CHRONIC FATIGUE SYNDROME - Primary    Worsened lately after a case of flu in January  She is still able to perform tasks and get through the day  Disc her child care obligations (? Too much) Made plan for good rest and nutrition  Suspect a post viral component  Labs today  No s/s of depression       Relevant Orders   CBC with Differential/Platelet   Comprehensive metabolic panel   TSH   Vitamin B12   Post viral syndrome    Suspect that her flu from January prompted a flare of chronic fatigue syndrome  Re assuring exam  Labs pending for fatigue as well Disc imp of rest and fluids/ balanced diet  Exercise as tolerated with breaks Continue to follow       Vitamin D deficiency    D level today She is feeling more fatigued       Relevant Orders  VITAMIN D 25 Hydroxy (Vit-D Deficiency, Fractures)

## 2016-10-31 NOTE — Assessment & Plan Note (Signed)
Worsened lately after a case of flu in January  She is still able to perform tasks and get through the day  Disc her child care obligations (? Too much) Made plan for good rest and nutrition  Suspect a post viral component  Labs today  No s/s of depression

## 2016-10-31 NOTE — Assessment & Plan Note (Signed)
Suspect that her flu from January prompted a flare of chronic fatigue syndrome  Re assuring exam  Labs pending for fatigue as well Disc imp of rest and fluids/ balanced diet  Exercise as tolerated with breaks Continue to follow

## 2016-10-31 NOTE — Assessment & Plan Note (Signed)
D level today She is feeling more fatigued

## 2016-10-31 NOTE — Progress Notes (Signed)
Pre visit review using our clinic review tool, if applicable. No additional management support is needed unless otherwise documented below in the visit note. 

## 2016-10-31 NOTE — Patient Instructions (Signed)
Your blood pressure is low normal  Eat more salt/salty foods and aim for 64 oz of fluids per day  Stay active - with breaks for rest  Make sure to get enough sleep  Labs today to rule out other causes of fatigue  Eat a balanced diet   I suspect the flu caused a post viral syndrome with flare of chronic fatigue -this may take a while longer to get better   Get outdoors / get sunshine exposure (without sunburning) Exercise with rest periods  Make sure you are not over taxing yourself with child care

## 2016-10-31 NOTE — Assessment & Plan Note (Signed)
With occ feeling of dizziness but no syncope Disc app intake of fluids and sodium  Warned not to change position quickly  Will continue to monitor

## 2016-11-01 ENCOUNTER — Ambulatory Visit (INDEPENDENT_AMBULATORY_CARE_PROVIDER_SITE_OTHER): Payer: Medicare Other | Admitting: *Deleted

## 2016-11-01 DIAGNOSIS — E538 Deficiency of other specified B group vitamins: Secondary | ICD-10-CM

## 2016-11-01 MED ORDER — CYANOCOBALAMIN 1000 MCG/ML IJ SOLN
1000.0000 ug | Freq: Once | INTRAMUSCULAR | Status: AC
  Administered 2016-11-01: 1000 ug via INTRAMUSCULAR

## 2017-02-17 DIAGNOSIS — L570 Actinic keratosis: Secondary | ICD-10-CM | POA: Diagnosis not present

## 2017-02-17 DIAGNOSIS — D2272 Melanocytic nevi of left lower limb, including hip: Secondary | ICD-10-CM | POA: Diagnosis not present

## 2017-02-17 DIAGNOSIS — D2271 Melanocytic nevi of right lower limb, including hip: Secondary | ICD-10-CM | POA: Diagnosis not present

## 2017-02-17 DIAGNOSIS — X32XXXA Exposure to sunlight, initial encounter: Secondary | ICD-10-CM | POA: Diagnosis not present

## 2017-02-17 DIAGNOSIS — D225 Melanocytic nevi of trunk: Secondary | ICD-10-CM | POA: Diagnosis not present

## 2017-02-17 DIAGNOSIS — D2261 Melanocytic nevi of right upper limb, including shoulder: Secondary | ICD-10-CM | POA: Diagnosis not present

## 2017-02-19 ENCOUNTER — Telehealth: Payer: Self-pay | Admitting: Family Medicine

## 2017-02-19 DIAGNOSIS — E538 Deficiency of other specified B group vitamins: Secondary | ICD-10-CM

## 2017-02-19 DIAGNOSIS — G9332 Myalgic encephalomyelitis/chronic fatigue syndrome: Secondary | ICD-10-CM

## 2017-02-19 DIAGNOSIS — E785 Hyperlipidemia, unspecified: Secondary | ICD-10-CM

## 2017-02-19 DIAGNOSIS — R5382 Chronic fatigue, unspecified: Secondary | ICD-10-CM

## 2017-02-19 DIAGNOSIS — E559 Vitamin D deficiency, unspecified: Secondary | ICD-10-CM

## 2017-02-19 NOTE — Telephone Encounter (Signed)
-----   Message from Ellamae Sia sent at 02/08/2017  4:24 PM EDT ----- Regarding: Lab orders for Tuesday, 7.31.18 Patient is scheduled for CPX labs, please order future labs, Thanks , Terri   With vit b12?

## 2017-02-21 ENCOUNTER — Ambulatory Visit: Payer: BLUE CROSS/BLUE SHIELD

## 2017-02-21 ENCOUNTER — Other Ambulatory Visit (INDEPENDENT_AMBULATORY_CARE_PROVIDER_SITE_OTHER): Payer: Medicare Other

## 2017-02-21 DIAGNOSIS — R5382 Chronic fatigue, unspecified: Secondary | ICD-10-CM

## 2017-02-21 DIAGNOSIS — E559 Vitamin D deficiency, unspecified: Secondary | ICD-10-CM

## 2017-02-21 DIAGNOSIS — E538 Deficiency of other specified B group vitamins: Secondary | ICD-10-CM

## 2017-02-21 DIAGNOSIS — G9332 Myalgic encephalomyelitis/chronic fatigue syndrome: Secondary | ICD-10-CM

## 2017-02-21 DIAGNOSIS — E785 Hyperlipidemia, unspecified: Secondary | ICD-10-CM | POA: Diagnosis not present

## 2017-02-21 LAB — COMPREHENSIVE METABOLIC PANEL
ALK PHOS: 41 U/L (ref 39–117)
ALT: 20 U/L (ref 0–35)
AST: 25 U/L (ref 0–37)
Albumin: 4.2 g/dL (ref 3.5–5.2)
BUN: 13 mg/dL (ref 6–23)
CALCIUM: 9.4 mg/dL (ref 8.4–10.5)
CO2: 30 meq/L (ref 19–32)
Chloride: 108 mEq/L (ref 96–112)
Creatinine, Ser: 0.68 mg/dL (ref 0.40–1.20)
GFR: 92.22 mL/min (ref >60.00)
GLUCOSE: 87 mg/dL (ref 70–99)
POTASSIUM: 4.4 meq/L (ref 3.5–5.1)
Sodium: 143 mEq/L (ref 135–145)
Total Bilirubin: 0.7 mg/dL (ref 0.2–1.2)
Total Protein: 6.6 g/dL (ref 6.0–8.3)

## 2017-02-21 LAB — CBC WITH DIFFERENTIAL/PLATELET
BASOS ABS: 0 10*3/uL (ref 0.0–0.1)
BASOS PCT: 0.7 % (ref 0.0–3.0)
EOS ABS: 0.1 10*3/uL (ref 0.0–0.7)
Eosinophils Relative: 1.4 % (ref 0.0–5.0)
HEMATOCRIT: 43.5 % (ref 36.0–46.0)
Hemoglobin: 14.3 g/dL (ref 12.0–15.0)
LYMPHS ABS: 3.1 10*3/uL (ref 0.7–4.0)
LYMPHS PCT: 52.1 % — AB (ref 12.0–46.0)
MCHC: 32.9 g/dL (ref 30.0–36.0)
MCV: 95.2 fl (ref 78.0–100.0)
MONO ABS: 0.5 10*3/uL (ref 0.1–1.0)
Monocytes Relative: 8.3 % (ref 3.0–12.0)
NEUTROS ABS: 2.2 10*3/uL (ref 1.4–7.7)
NEUTROS PCT: 37.5 % — AB (ref 43.0–77.0)
PLATELETS: 180 10*3/uL (ref 150.0–400.0)
RBC: 4.57 Mil/uL (ref 3.87–5.11)
RDW: 13.4 % (ref 11.5–15.5)
WBC: 5.8 10*3/uL (ref 4.0–10.5)

## 2017-02-21 LAB — VITAMIN D 25 HYDROXY (VIT D DEFICIENCY, FRACTURES): VITD: 50.5 ng/mL (ref 30.00–100.00)

## 2017-02-21 LAB — LIPID PANEL
CHOL/HDL RATIO: 2
Cholesterol: 177 mg/dL (ref 0–200)
HDL: 71.7 mg/dL (ref >39.00)
LDL Cholesterol: 90 mg/dL (ref 0–99)
NonHDL: 104.97
TRIGLYCERIDES: 74 mg/dL (ref 0.0–149.0)
VLDL: 14.8 mg/dL (ref 0.0–40.0)

## 2017-02-21 LAB — VITAMIN B12: Vitamin B-12: 936 pg/mL — ABNORMAL HIGH (ref 211–911)

## 2017-02-21 LAB — TSH: TSH: 2.62 u[IU]/mL (ref 0.35–4.50)

## 2017-03-02 DIAGNOSIS — H2513 Age-related nuclear cataract, bilateral: Secondary | ICD-10-CM | POA: Diagnosis not present

## 2017-03-03 ENCOUNTER — Ambulatory Visit (INDEPENDENT_AMBULATORY_CARE_PROVIDER_SITE_OTHER): Payer: Medicare Other | Admitting: Family Medicine

## 2017-03-03 ENCOUNTER — Other Ambulatory Visit: Payer: Self-pay | Admitting: Family Medicine

## 2017-03-03 ENCOUNTER — Encounter: Payer: Self-pay | Admitting: Family Medicine

## 2017-03-03 VITALS — BP 102/58 | HR 66 | Temp 97.5°F | Ht 62.5 in | Wt 114.2 lb

## 2017-03-03 DIAGNOSIS — Z23 Encounter for immunization: Secondary | ICD-10-CM

## 2017-03-03 DIAGNOSIS — M8580 Other specified disorders of bone density and structure, unspecified site: Secondary | ICD-10-CM | POA: Diagnosis not present

## 2017-03-03 DIAGNOSIS — R5382 Chronic fatigue, unspecified: Secondary | ICD-10-CM

## 2017-03-03 DIAGNOSIS — Z0000 Encounter for general adult medical examination without abnormal findings: Secondary | ICD-10-CM | POA: Diagnosis not present

## 2017-03-03 DIAGNOSIS — E2839 Other primary ovarian failure: Secondary | ICD-10-CM | POA: Insufficient documentation

## 2017-03-03 DIAGNOSIS — Z1211 Encounter for screening for malignant neoplasm of colon: Secondary | ICD-10-CM | POA: Diagnosis not present

## 2017-03-03 DIAGNOSIS — E785 Hyperlipidemia, unspecified: Secondary | ICD-10-CM

## 2017-03-03 DIAGNOSIS — Z1231 Encounter for screening mammogram for malignant neoplasm of breast: Secondary | ICD-10-CM

## 2017-03-03 DIAGNOSIS — G9332 Myalgic encephalomyelitis/chronic fatigue syndrome: Secondary | ICD-10-CM

## 2017-03-03 DIAGNOSIS — E559 Vitamin D deficiency, unspecified: Secondary | ICD-10-CM | POA: Diagnosis not present

## 2017-03-03 DIAGNOSIS — E538 Deficiency of other specified B group vitamins: Secondary | ICD-10-CM | POA: Diagnosis not present

## 2017-03-03 MED ORDER — SIMVASTATIN 40 MG PO TABS: ORAL_TABLET | ORAL | 3 refills | Status: DC

## 2017-03-03 NOTE — Patient Instructions (Addendum)
You are due for a screening mammogram - call [facility] to schedule   Get a flu shot in sept or oct   You are due for a tetanus shot - look into getting it at the drugstore   Take a look at the paperwork for advance directive -- when you do it - get [place] a copy and we will put it in your chart  Continue the oral B12   Take care of yourself   prevnar vaccine today

## 2017-03-03 NOTE — Assessment & Plan Note (Addendum)
Flared again after viral infection last year  Labs reviewed

## 2017-03-03 NOTE — Progress Notes (Signed)
Subjective:    Patient ID: Tracy [name], female    DOB: [birth year], 66 y.o.   MRN: [id number]  HPI  Here for welcome to medicare visit  I have personally reviewed the Medicare Annual Wellness questionnaire and have noted 1. The patient's medical and social history 2. Their use of alcohol, tobacco or illicit drugs 3. Their current medications and supplements 4. The patient's functional ability including ADL's, fall risks, home safety risks and hearing or visual             impairment. 5. Diet and physical activities 6. Evidence for depression or mood disorders  The patients weight, height, BMI have been recorded in the chart and visual acuity is per eye clinic.  I have made referrals, counseling and provided education to the patient based review of the above and I have provided the pt with a written personalized care plan for preventive services. Reviewed and updated provider list, see scanned forms.   Hearing Screening   125Hz  250Hz  500Hz  1000Hz  2000Hz  3000Hz  4000Hz  6000Hz  8000Hz   Right ear:   40 40 40  40    Left ear:   40 40 40  40    Vision Screening Comments: Pt had eye exam on 03/02/17 at [facility]    See scanned forms.  Routine anticipatory guidance given to patient.  See health maintenance. Colon cancer screening 9/15-colonoscopy 10 year recall  Breast cancer screening  Mammogram 8/17 (normal) -she is due and will schedule herself  Self breast exam-no lumps  Has had a hysterectomy-no gyn problems or symptoms  Flu vaccine-will due in the fall  Tetanus vaccine 6/08 Pneumovax- due for the prevnar  Zoster vaccine 7/13  dexa 10/15 No falls or fractures  On ca and D (also drinking almond milk)  She was treated with 5 y of evista completed in 2014  Advance directive-does not have  Cognitive function addressed- see scanned forms- and if abnormal then additional documentation follows.  No significant memory problems     Wt Readings from Last 3 Encounters:    03/03/17 114 lb 4 oz (51.8 kg)  10/31/16 111 lb 12 oz (50.7 kg)  08/23/16 111 lb 4 oz (50.5 kg)  eats well and stays active /wants to be more active  20.56 kg/m   BP Readings from Last 3 Encounters:  03/03/17 (!) 102/58  10/31/16 (!) 104/58  08/23/16 110/68     Hyperlipidemia Lab Results  Component Value Date   CHOL 177 02/21/2017   CHOL 166 02/18/2016   CHOL 173 02/13/2015   Lab Results  Component Value Date   HDL 71.70 02/21/2017   HDL 67.40 02/18/2016   HDL 58.10 02/13/2015   Lab Results  Component Value Date   LDLCALC 90 02/21/2017   LDLCALC 82 02/18/2016   LDLCALC 102 (H) 02/13/2015   Lab Results  Component Value Date   TRIG 74.0 02/21/2017   TRIG 83.0 02/18/2016   TRIG 64.0 02/13/2015   Lab Results  Component Value Date   CHOLHDL 2 02/21/2017   CHOLHDL 2 02/18/2016   CHOLHDL 3 02/13/2015   Lab Results  Component Value Date   LDLDIRECT 172.6 05/20/2013  great cholesterol profile  Simvastatin and diet    Hx of B12 def Lab Results  Component Value Date   [id number] 831 (H) 02/21/2017  orally supplemented and had one b12 shot   Hx of D def  D level is 50.5   Still dealing with chronic fatigue  (post  viral ?)  Also not sleeping well   Lab Results  Component Value Date   WBC 5.8 02/21/2017   HGB 14.3 02/21/2017   HCT 43.5 02/21/2017   MCV 95.2 02/21/2017   PLT 180.0 02/21/2017      Chemistry      Component Value Date/Time   NA 143 02/21/2017 0814   K 4.4 02/21/2017 0814   CL 108 02/21/2017 0814   CO2 30 02/21/2017 0814   BUN 13 02/21/2017 0814   CREATININE 0.68 02/21/2017 0814      Component Value Date/Time   CALCIUM 9.4 02/21/2017 0814   ALKPHOS 41 02/21/2017 0814   AST 25 02/21/2017 0814   ALT 20 02/21/2017 0814   BILITOT 0.7 02/21/2017 0814     glucose 87  Lab Results  Component Value Date   TSH 2.62 02/21/2017     PMH and SH reviewed  Meds, vitals, and allergies reviewed.   ROS: See HPI.  Otherwise negative.       Patient Active Problem List   Diagnosis Date Noted  . Welcome to Medicare preventive visit 03/03/2017  . Estrogen deficiency 03/03/2017  . Borderline low blood pressure determined by examination 10/31/2016  . Vitamin B12 deficiency 10/31/2016  . Chest pain with moderate risk for cardiac etiology 10/17/2014  . Colon cancer screening 02/17/2014  . Other screening mammogram 02/07/2011  . Routine general medical examination at a health care facility 02/01/2011  . Vitamin D deficiency 04/14/2008  . Osteopenia 01/09/2007  . Hyperlipidemia with target LDL less than 130 01/04/2007  . VAGINITIS, ATROPHIC 01/04/2007  . SCOLIOSIS 01/04/2007  . CHRONIC FATIGUE SYNDROME 01/04/2007   Past Medical History:  Diagnosis Date  . Chronic fatigue syndrome   . Hyperlipidemia   . Osteopenia   . S/P cervical polypectomy    Past Surgical History:  Procedure Laterality Date  . ABDOMINAL HYSTERECTOMY     one ovary removed/ bleeding  . DILATION AND CURETTAGE OF UTERUS     Social History  Substance Use Topics  . Smoking status: Never Smoker  . Smokeless tobacco: Never Used  . Alcohol use No   Family History  Problem Relation Age of Onset  . Stroke Mother   . Hypertension Father   . Stroke Father   . Hyperlipidemia Sister   . Hyperlipidemia Brother   . Cancer Paternal Uncle   . Cancer Maternal Grandmother        intestinal CA  . Cancer Paternal Grandfather        CA unsure which kind  . Hyperlipidemia Brother   . Heart attack Sister        Thought to be silent MI  . Breast cancer Neg Hx    Allergies  Allergen Reactions  . Alendronate Sodium     REACTION: GI  . Amoxicillin     REACTION: vomiting  . Ibuprofen     REACTION: rash  . Ivp Dye [Iodinated Diagnostic Agents]    Current Outpatient Prescriptions on File Prior to Visit  Medication Sig Dispense Refill  . Calcium Carbonate (CALTRATE 600 PO) Take 2 tablets by mouth daily.      . Cholecalciferol (VITAMIN D) 2000 UNITS  CAPS Take 1 capsule by mouth daily.      . cyanocobalamin 1000 MCG tablet Take 1,000 mcg by mouth daily.     No current facility-administered medications on file prior to visit.     Review of Systems Review of Systems  Constitutional: Negative for fever, appetite change,  fatigue and unexpected weight change.  Eyes: Negative for pain and visual disturbance.  Respiratory: Negative for cough and shortness of breath.   Cardiovascular: Negative for cp or palpitations    Gastrointestinal: Negative for nausea, diarrhea and constipation.  Genitourinary: Negative for urgency and frequency.  Skin: Negative for pallor or rash   Neurological: Negative for weakness, light-headedness, numbness and headaches.  Hematological: Negative for adenopathy. Does not bruise/bleed easily.  Psychiatric/Behavioral: Negative for dysphoric mood. The patient is not nervous/anxious.         Objective:   Physical Exam  Constitutional: She appears well-developed and well-nourished. No distress.  Well appearing   HENT:  Head: Normocephalic and atraumatic.  Right Ear: External ear normal.  Left Ear: External ear normal.  Mouth/Throat: Oropharynx is clear and moist.  Eyes: Pupils are equal, round, and reactive to light. Conjunctivae and EOM are normal. No scleral icterus.  Neck: Normal range of motion. Neck supple. No JVD present. Carotid bruit is not present. No thyromegaly present.  Cardiovascular: Normal rate, regular rhythm, normal heart sounds and intact distal pulses.  Exam reveals no gallop.   Pulmonary/Chest: Effort normal and breath sounds normal. No respiratory distress. She has no wheezes. She exhibits no tenderness.  Abdominal: Soft. Bowel sounds are normal. She exhibits no distension, no abdominal bruit and no mass. There is no tenderness.  Genitourinary: No breast swelling, tenderness, discharge or bleeding.  Genitourinary Comments: Breast exam: No mass, nodules, thickening, tenderness, bulging,  retraction, inflamation, nipple discharge or skin changes noted.  No axillary or clavicular LA.      Musculoskeletal: Normal range of motion. She exhibits no edema or tenderness.  No kyphosis   Lymphadenopathy:    She has no cervical adenopathy.  Neurological: She is alert. She has normal reflexes. No cranial nerve deficit. She exhibits normal muscle tone. Coordination normal.  Skin: Skin is warm and dry. No rash noted. No erythema. No pallor.  Solar lentigines diffusely sks on trunk  Psychiatric: She has a normal mood and affect.          Assessment & Plan:   Problem List Items Addressed This Visit      Musculoskeletal and Integument   Osteopenia    5 y of evista  dexa 2015 Ref for re check On ca and D  No falls or fx  Disc need for calcium/ vitamin D/ wt bearing exercise and bone density test every 2 y to monitor Disc safety/ fracture risk in detail          Other   [place] again after viral infection last year  Labs reviewed       Colon cancer screening    Colonoscopy 9/15 with 10 y recall      Estrogen deficiency   Relevant Orders   DG Bone Density   Hyperlipidemia with target LDL less than 130 (Chronic)    Disc goals for lipids and reasons to control them Rev labs with pt Rev low sat fat diet in detail  Controlled with simvastatin and diet       Relevant Medications   simvastatin (ZOCOR) 40 MG tablet   Vitamin B12 deficiency   Vitamin D deficiency    Lab Results  Component Value Date   VITAMINB12 936 (H) 02/21/2017   Continue oral supplementation Had one shot       Welcome to [employer] preventive visit - Primary    Reviewed health habits including diet and exercise and skin  cancer prevention Reviewed appropriate screening tests for age  Also reviewed health mt list, fam hx and immunization status , as well as social and family history   See HPI Labs rev  You are due for a screening mammogram - call [facility] to  schedule   Get a flu shot in sept or oct   You are due for a tetanus shot - look into getting it at the drugstore   Take a look at the paperwork for advance directive -- when you do it - get [place] a copy and we will put it in your chart  Continue the oral B12   Take care of yourself   prevnar vaccine today       Other Visit Diagnoses    Need for vaccination with 13-polyvalent pneumococcal conjugate vaccine       Relevant Orders   Pneumococcal conjugate vaccine 13-valent (Completed)

## 2017-03-04 NOTE — Assessment & Plan Note (Signed)
Reviewed health habits including diet and exercise and skin cancer prevention Reviewed appropriate screening tests for age  Also reviewed health mt list, fam hx and immunization status , as well as social and family history   See HPI Labs rev  You are due for a screening mammogram - call [facility] to schedule   Get a flu shot in sept or oct   You are due for a tetanus shot - look into getting it at the drugstore   Take a look at the paperwork for advance directive -- when you do it - get [place] a copy and we will put it in your chart  Continue the oral B12   Take care of yourself   prevnar vaccine today

## 2017-03-04 NOTE — Assessment & Plan Note (Signed)
Colonoscopy 9/15 with 10 y recall

## 2017-03-04 NOTE — Assessment & Plan Note (Signed)
Disc goals for lipids and reasons to control them Rev labs with pt Rev low sat fat diet in detail Controlled with simvastatin and diet 

## 2017-03-04 NOTE — Assessment & Plan Note (Addendum)
5 y of evista  dexa 2015 Ref for re check On ca and D  No falls or fx  Disc need for calcium/ vitamin D/ wt bearing exercise and bone density test every 2 y to monitor Disc safety/ fracture risk in detail

## 2017-03-04 NOTE — Assessment & Plan Note (Signed)
Lab Results  Component Value Date   [id number] 144 (H) 02/21/2017   Continue oral supplementation Had one shot

## 2017-03-05 ENCOUNTER — Encounter: Payer: Self-pay | Admitting: Family Medicine

## 2017-03-22 ENCOUNTER — Encounter: Payer: Self-pay | Admitting: Family Medicine

## 2017-03-30 DIAGNOSIS — Z23 Encounter for immunization: Secondary | ICD-10-CM | POA: Diagnosis not present

## 2017-04-03 ENCOUNTER — Encounter: Payer: Self-pay | Admitting: Family Medicine

## 2017-04-17 ENCOUNTER — Ambulatory Visit
Admission: RE | Admit: 2017-04-17 | Discharge: 2017-04-17 | Disposition: A | Discharge status: Home / Self Care | Payer: Medicare Other | Source: Ambulatory Visit | Attending: Family Medicine | Admitting: Family Medicine

## 2017-04-17 DIAGNOSIS — Z13820 Encounter for screening for osteoporosis: Secondary | ICD-10-CM | POA: Insufficient documentation

## 2017-04-17 DIAGNOSIS — Z780 Asymptomatic menopausal state: Secondary | ICD-10-CM | POA: Diagnosis not present

## 2017-04-17 DIAGNOSIS — M85852 Other specified disorders of bone density and structure, left thigh: Secondary | ICD-10-CM | POA: Diagnosis not present

## 2017-04-17 DIAGNOSIS — M8580 Other specified disorders of bone density and structure, unspecified site: Secondary | ICD-10-CM | POA: Diagnosis not present

## 2017-04-17 DIAGNOSIS — E2839 Other primary ovarian failure: Secondary | ICD-10-CM

## 2017-04-17 DIAGNOSIS — Z1231 Encounter for screening mammogram for malignant neoplasm of breast: Secondary | ICD-10-CM | POA: Diagnosis not present

## 2018-02-07 ENCOUNTER — Other Ambulatory Visit: Payer: Self-pay | Admitting: *Deleted

## 2018-02-07 MED ORDER — SIMVASTATIN 40 MG PO TABS: ORAL_TABLET | ORAL | 1 refills | Status: DC

## 2018-03-05 DIAGNOSIS — H2513 Age-related nuclear cataract, bilateral: Secondary | ICD-10-CM | POA: Diagnosis not present

## 2018-03-07 ENCOUNTER — Telehealth: Payer: Self-pay | Admitting: Family Medicine

## 2018-03-07 DIAGNOSIS — G9332 Myalgic encephalomyelitis/chronic fatigue syndrome: Secondary | ICD-10-CM

## 2018-03-07 DIAGNOSIS — E538 Deficiency of other specified B group vitamins: Secondary | ICD-10-CM

## 2018-03-07 DIAGNOSIS — E785 Hyperlipidemia, unspecified: Secondary | ICD-10-CM

## 2018-03-07 DIAGNOSIS — E559 Vitamin D deficiency, unspecified: Secondary | ICD-10-CM

## 2018-03-07 DIAGNOSIS — R5382 Chronic fatigue, unspecified: Secondary | ICD-10-CM

## 2018-03-07 NOTE — Telephone Encounter (Signed)
-----   Message from Eustace Pen, LPN sent at 0/16/4290  6:33 PM EDT ----- Regarding: Labs 8/15 Lab orders needed. Thank you.  Insurance:  [employer]

## 2018-03-08 ENCOUNTER — Ambulatory Visit (INDEPENDENT_AMBULATORY_CARE_PROVIDER_SITE_OTHER): Payer: Medicare Other

## 2018-03-08 VITALS — BP 116/64 | HR 57 | Temp 98.0°F | Ht 62.5 in | Wt 113.8 lb

## 2018-03-08 DIAGNOSIS — Z23 Encounter for immunization: Secondary | ICD-10-CM

## 2018-03-08 DIAGNOSIS — E785 Hyperlipidemia, unspecified: Secondary | ICD-10-CM | POA: Diagnosis not present

## 2018-03-08 DIAGNOSIS — Z0000 Encounter for general adult medical examination without abnormal findings: Secondary | ICD-10-CM

## 2018-03-08 DIAGNOSIS — G9332 Myalgic encephalomyelitis/chronic fatigue syndrome: Secondary | ICD-10-CM

## 2018-03-08 DIAGNOSIS — R5382 Chronic fatigue, unspecified: Secondary | ICD-10-CM | POA: Diagnosis not present

## 2018-03-08 DIAGNOSIS — E559 Vitamin D deficiency, unspecified: Secondary | ICD-10-CM | POA: Diagnosis not present

## 2018-03-08 DIAGNOSIS — E538 Deficiency of other specified B group vitamins: Secondary | ICD-10-CM

## 2018-03-08 DIAGNOSIS — Z1159 Encounter for screening for other viral diseases: Secondary | ICD-10-CM | POA: Diagnosis not present

## 2018-03-08 LAB — COMPREHENSIVE METABOLIC PANEL
ALBUMIN: 4.2 g/dL (ref 3.5–5.2)
ALK PHOS: 38 U/L — AB (ref 39–117)
ALT: 16 U/L (ref 0–35)
AST: 21 U/L (ref 0–37)
BILIRUBIN TOTAL: 0.6 mg/dL (ref 0.2–1.2)
BUN: 11 mg/dL (ref 6–23)
CO2: 31 mEq/L (ref 19–32)
Calcium: 9.5 mg/dL (ref 8.4–10.5)
Chloride: 107 mEq/L (ref 96–112)
Creatinine, Ser: 0.75 mg/dL (ref 0.40–1.20)
GFR: 82.1 mL/min (ref >60.00)
GLUCOSE: 87 mg/dL (ref 70–99)
Potassium: 4.3 mEq/L (ref 3.5–5.1)
Sodium: 143 mEq/L (ref 135–145)
TOTAL PROTEIN: 6.6 g/dL (ref 6.0–8.3)

## 2018-03-08 LAB — LIPID PANEL
CHOLESTEROL: 173 mg/dL (ref 0–200)
HDL: 72.8 mg/dL (ref >39.00)
LDL CALC: 87 mg/dL (ref 0–99)
NonHDL: 100.54
TRIGLYCERIDES: 68 mg/dL (ref 0.0–149.0)
Total CHOL/HDL Ratio: 2
VLDL: 13.6 mg/dL (ref 0.0–40.0)

## 2018-03-08 LAB — VITAMIN D 25 HYDROXY (VIT D DEFICIENCY, FRACTURES): VITD: 44.23 ng/mL (ref 30.00–100.00)

## 2018-03-08 LAB — CBC WITH DIFFERENTIAL/PLATELET
BASOS ABS: 0.1 10*3/uL (ref 0.0–0.1)
Basophils Relative: 1.2 % (ref 0.0–3.0)
Eosinophils Absolute: 0.1 10*3/uL (ref 0.0–0.7)
Eosinophils Relative: 2.5 % (ref 0.0–5.0)
HCT: 41.3 % (ref 36.0–46.0)
Hemoglobin: 13.8 g/dL (ref 12.0–15.0)
LYMPHS ABS: 2.5 10*3/uL (ref 0.7–4.0)
Lymphocytes Relative: 44.9 % (ref 12.0–46.0)
MCHC: 33.3 g/dL (ref 30.0–36.0)
MCV: 92.5 fl (ref 78.0–100.0)
MONOS PCT: 8.4 % (ref 3.0–12.0)
Monocytes Absolute: 0.5 10*3/uL (ref 0.1–1.0)
NEUTROS PCT: 43 % (ref 43.0–77.0)
Neutro Abs: 2.4 10*3/uL (ref 1.4–7.7)
Platelets: 182 10*3/uL (ref 150.0–400.0)
RBC: 4.46 Mil/uL (ref 3.87–5.11)
RDW: 13.4 % (ref 11.5–15.5)
WBC: 5.6 10*3/uL (ref 4.0–10.5)

## 2018-03-08 LAB — VITAMIN B12: VITAMIN B 12: 1105 pg/mL — AB (ref 211–911)

## 2018-03-08 LAB — TSH: TSH: 1.68 u[IU]/mL (ref 0.35–4.50)

## 2018-03-08 NOTE — Progress Notes (Signed)
Subjective:   Tracy [name] is a 67 y.o. female who presents for an Initial Medicare Annual Wellness Visit.  Review of Systems    N/A  Cardiac Risk Factors include: advanced age (>8men, >21 women);dyslipidemia     Objective:    Today's Vitals   03/08/18 0924  BP: 116/64  Pulse: (!) 57  Temp: 98 F (36.7 C)  TempSrc: Oral  SpO2: 96%  Weight: 113 lb 12 oz (51.6 kg)  Height: 5' 2.5" (1.588 m)  PainSc: 0-No pain   Body mass index is 20.47 kg/m.  Advanced Directives 03/08/2018  Does Patient Have a Medical Advance Directive? No  Would patient like information on creating a medical advance directive? No - Patient declined    Current Medications (verified) Outpatient Encounter Medications as of 03/08/2018  Medication Sig  . Calcium Carbonate (CALTRATE 600 PO) Take 2 tablets by mouth daily.    . Cholecalciferol (VITAMIN D) 2000 UNITS CAPS Take 1 capsule by mouth daily.    . cyanocobalamin 1000 MCG tablet Take 1,000 mcg by mouth daily.  . simvastatin (ZOCOR) 40 MG tablet TAKE (1) TABLET BY MOUTH DAILY AT BEDTIME   No facility-administered encounter medications on file as of 03/08/2018.     Allergies (verified) Alendronate sodium; Amoxicillin; Ibuprofen; and Ivp dye [iodinated diagnostic agents]   History: Past Medical History:  Diagnosis Date  . Chronic fatigue syndrome   . Hyperlipidemia   . Osteopenia   . S/P cervical polypectomy    Past Surgical History:  Procedure Laterality Date  . ABDOMINAL HYSTERECTOMY     one ovary removed/ bleeding  . DILATION AND CURETTAGE OF UTERUS     Family History  Problem Relation Age of Onset  . Stroke Mother   . Hypertension Father   . Stroke Father   . Hyperlipidemia Sister   . Hyperlipidemia Brother   . Cancer Paternal Uncle   . Cancer Maternal Grandmother        intestinal CA  . Cancer Paternal Grandfather        CA unsure which kind  . Hyperlipidemia Brother   . Heart attack Sister        Thought to be silent  MI  . Breast cancer Neg Hx    Social History   Socioeconomic History  . Marital status: Married    Spouse name: Not on file  . Number of children: Not on file  . Years of education: Not on file  . Highest education level: Not on file  Occupational History  . Not on file  Social Needs  . Financial resource strain: Not on file  . Food insecurity:    Worry: Not on file    Inability: Not on file  . Transportation needs:    Medical: Not on file    Non-medical: Not on file  Tobacco Use  . Smoking status: Never Smoker  . Smokeless tobacco: Never Used  Substance and Sexual Activity  . Alcohol use: No    Alcohol/week: 0.0 standard drinks  . Drug use: No  . Sexual activity: Yes    Partners: Female  Lifestyle  . Physical activity:    Days per week: Not on file    Minutes per session: Not on file  . Stress: Not on file  Relationships  . Social connections:    Talks on phone: Not on file    Gets together: Not on file    Attends religious service: Not on file    Active member  of club or organization: Not on file    Attends meetings of clubs or organizations: Not on file    Relationship status: Not on file  Other Topics Concern  . Not on file  Social History Narrative  . Not on file    Tobacco Counseling Counseling given: No   Clinical Intake:  Pre-visit preparation completed: Yes  Pain : No/denies pain Pain Score: 0-No pain     Nutritional Status: BMI of 19-24  Normal Nutritional Risks: None Diabetes: No  How often do you need to have someone help you when you read instructions, pamphlets, or other written materials from your doctor or pharmacy?: 1 - Never What is the last grade level you completed in school?: 12th grade + some college  Interpreter Needed?: No  Comments: pt lives with spouse Information entered by :: LPinson, LPN   Activities of Daily Living In your present state of health, do you have any difficulty performing the following activities:  03/08/2018  Hearing? N  Vision? N  Difficulty concentrating or making decisions? N  Walking or climbing stairs? Y  Comment intermittent SOB when climbing stairs  Dressing or bathing? N  Doing errands, shopping? N  Preparing Food and eating ? N  Using the Toilet? N  In the past six months, have you accidently leaked urine? N  Do you have problems with loss of bowel control? N  Managing your Medications? N  Managing your Finances? N  Housekeeping or managing your Housekeeping? N  Some recent data might be hidden     Immunizations and Health Maintenance Immunization History  Administered Date(s) Administered  . Influenza, High Dose Seasonal PF 03/30/2017  . Influenza,inj,Quad PF,6+ Mos 09/08/2016  . Influenza-Unspecified 04/24/2013, 05/05/2014, 05/04/2015  . Pneumococcal Conjugate-13 03/03/2017  . Pneumococcal Polysaccharide-23 03/08/2018  . Td 07/25/1996, 01/09/2007, 03/30/2017  . Zoster 02/14/2012   There are no preventive care reminders to display for this patient.  Patient Care Team: Tower, Wynelle Fanny, MD as PCP - General  Indicate any recent Medical Services you may have received from other than [facility] providers in the past year (date may be approximate).     Assessment:   This is a routine wellness examination for Tracy [name].  Hearing/Vision screen  Hearing Screening   125Hz  250Hz  500Hz  1000Hz  2000Hz  3000Hz  4000Hz  6000Hz  8000Hz   Right ear:   40 40 40  40    Left ear:   40 40 40  40    Vision Screening Comments: Vision exam on 03/05/18 with Dr. Junie Panning [facility]  Dietary issues and exercise activities discussed: Current Exercise Habits: The patient does not participate in regular exercise at present, Exercise limited by: None identified  Goals    . Patient Stated     Starting 03/08/2018, I will continue to take medications as prescribed.       Depression Screen PHQ 2/9 Scores 03/08/2018 03/03/2017 02/17/2014  PHQ - 2 Score 0 0 0  PHQ- 9 Score 0 4 -      Fall Risk Fall Risk  03/08/2018 03/03/2017 02/17/2014  Falls in the past year? No No No    Cognitive Function: MMSE - Mini Mental State Exam 03/08/2018  Orientation to time 5  Orientation to Place 5  Registration 3  Attention/ Calculation 0  Recall 3  Language- name 2 objects 0  Language- repeat 1  Language- follow 3 step command 3  Language- read & follow direction 0  Write a sentence 0  Copy design 0  Total score 20     PLEASE NOTE: A Mini-Cog screen was completed. Maximum score is 20. A value of 0 denotes this part of Folstein MMSE was not completed or the patient failed this part of the Mini-Cog screening.   Mini-Cog Screening Orientation to Time - Max 5 pts Orientation to Place - Max 5 pts Registration - Max 3 pts Recall - Max 3 pts Language Repeat - Max 1 pts Language Follow 3 Step Command - Max 3 pts     Screening Tests Health Maintenance  Topic Date Due  . INFLUENZA VACCINE  10/23/2018 (Originally 02/22/2018)  . MAMMOGRAM  04/17/2018  . COLONOSCOPY  04/11/2024  . TETANUS/TDAP  03/31/2027  . DEXA SCAN  Completed  . Hepatitis C Screening  Completed  . PNA vac Low Risk Adult  Completed       Plan:   I have personally reviewed, addressed, and noted the following in the patient's chart:  A. Medical and social history B. Use of alcohol, tobacco or illicit drugs  C. Current medications and supplements D. Functional ability and status E.  Nutritional status F.  Physical activity G. Advance directives H. List of other physicians I.  Hospitalizations, surgeries, and ER visits in previous 12 months J.  [place] to include hearing, vision, cognitive, depression L. Referrals and appointments - none  In addition, I have reviewed and discussed with patient certain preventive protocols, quality metrics, and best practice recommendations. A written personalized care plan for preventive services as well as general preventive health recommendations were  provided to patient.  See attached scanned questionnaire for additional information.   Signed,   Lindell Noe, MHA, BS, LPN Health Coach

## 2018-03-08 NOTE — Progress Notes (Signed)
PCP notes:   Health maintenance:  Flu vaccine- addressed  Abnormal screenings:   None  Patient concerns:   Patient reports continued concerns with chronic fatigue.   Nurse concerns:  None  Next PCP appt:   03/13/18 @ 0930  I reviewed health advisor's note, was available for consultation, and agree with documentation and plan. Loura Pardon MD

## 2018-03-08 NOTE — Patient Instructions (Signed)
Tracy [name] , Thank you for taking time to come for your Medicare Wellness Visit. I appreciate your ongoing commitment to your health goals. Please review the following plan we discussed and let me know if I can assist you in the future.   These are the goals we discussed: Goals    . Patient Stated     Starting 03/08/2018, I will continue to take medications as prescribed.        This is a list of the screening recommended for you and due dates:  Health Maintenance  Topic Date Due  . Flu Shot  10/23/2018*  . Mammogram  04/17/2018  . Colon Cancer Screening  04/11/2024  . Tetanus Vaccine  03/31/2027  . DEXA scan (bone density measurement)  Completed  .  Hepatitis C: One time screening is recommended by Center for Disease Control  (CDC) for  adults born from [birth year] through 1964.   Completed  . Pneumonia vaccines  Completed  *Topic was postponed. The date shown is not the original due date.   Preventive Care for Adults  A healthy lifestyle and preventive care can promote health and wellness. Preventive health guidelines for adults include the following key practices.  . A routine yearly physical is a good way to check with your health care provider about your health and preventive screening. It is a chance to share any concerns and updates on your health and to receive a thorough exam.  . Visit your dentist for a routine exam and preventive care every 6 months. Brush your teeth twice a day and floss once a day. Good oral hygiene prevents tooth decay and gum disease.  . The frequency of eye exams is based on your age, health, family medical history, use  of contact lenses, and other factors. Follow your health care provider's recommendations for frequency of eye exams.  . Eat a healthy diet. Foods like vegetables, fruits, whole grains, low-fat dairy products, and lean protein foods contain the nutrients you need without too many calories. Decrease your intake of foods high in solid fats,  added sugars, and salt. Eat the right amount of calories for you. Get information about a proper diet from your health care provider, if necessary.  . Regular physical exercise is one of the most important things you can do for your health. Most adults should get at least 150 minutes of moderate-intensity exercise (any activity that increases your heart rate and causes you to sweat) each week. In addition, most adults need muscle-strengthening exercises on 2 or more days a week.  Silver Sneakers may be a benefit available to you. To determine eligibility, you may visit the website: www.silversneakers.com or contact program at 256-641-4916 Mon-Fri between 8AM-8PM.   . Maintain a healthy weight. The body mass index (BMI) is a screening tool to identify possible weight problems. It provides an estimate of body fat based on height and weight. Your health care provider can find your BMI and can help you achieve or maintain a healthy weight.   For adults 20 years and older: ? A BMI below 18.5 is considered underweight. ? A BMI of 18.5 to 24.9 is normal. ? A BMI of 25 to 29.9 is considered overweight. ? A BMI of 30 and above is considered obese.   . Maintain normal blood lipids and cholesterol levels by exercising and minimizing your intake of saturated fat. Eat a balanced diet with plenty of fruit and vegetables. Blood tests for lipids and cholesterol should begin at  age 41 and be repeated every 5 years. If your lipid or cholesterol levels are high, you are over 50, or you are at high risk for heart disease, you may need your cholesterol levels checked more frequently. Ongoing high lipid and cholesterol levels should be treated with medicines if diet and exercise are not working.  . If you smoke, find out from your health care provider how to quit. If you do not use tobacco, please do not start.  . If you choose to drink alcohol, please do not consume more than 2 drinks per day. One drink is  considered to be 12 ounces (355 mL) of beer, 5 ounces (148 mL) of wine, or 1.5 ounces (44 mL) of liquor.  . If you are 61-37 years old, ask your health care provider if you should take aspirin to prevent strokes.  . Use sunscreen. Apply sunscreen liberally and repeatedly throughout the day. You should seek shade when your shadow is shorter than you. Protect yourself by wearing long sleeves, pants, a wide-brimmed hat, and sunglasses year round, whenever you are outdoors.  . Once a month, do a whole body skin exam, using a mirror to look at the skin on your back. Tell your health care provider of new moles, moles that have irregular borders, moles that are larger than a pencil eraser, or moles that have changed in shape or color.

## 2018-03-09 ENCOUNTER — Other Ambulatory Visit: Payer: Self-pay | Admitting: Family Medicine

## 2018-03-09 DIAGNOSIS — Z1231 Encounter for screening mammogram for malignant neoplasm of breast: Secondary | ICD-10-CM

## 2018-03-09 LAB — HEPATITIS C ANTIBODY
HEP C AB: NONREACTIVE
SIGNAL TO CUT-OFF: 0.01 (ref <1.00)

## 2018-03-13 ENCOUNTER — Encounter: Payer: Self-pay | Admitting: Family Medicine

## 2018-03-13 ENCOUNTER — Ambulatory Visit (INDEPENDENT_AMBULATORY_CARE_PROVIDER_SITE_OTHER): Payer: Medicare Other | Admitting: Family Medicine

## 2018-03-13 VITALS — BP 118/72 | HR 58 | Temp 98.3°F | Ht 62.5 in | Wt 114.2 lb

## 2018-03-13 DIAGNOSIS — E559 Vitamin D deficiency, unspecified: Secondary | ICD-10-CM

## 2018-03-13 DIAGNOSIS — R5382 Chronic fatigue, unspecified: Secondary | ICD-10-CM

## 2018-03-13 DIAGNOSIS — E785 Hyperlipidemia, unspecified: Secondary | ICD-10-CM

## 2018-03-13 DIAGNOSIS — M8580 Other specified disorders of bone density and structure, unspecified site: Secondary | ICD-10-CM | POA: Diagnosis not present

## 2018-03-13 DIAGNOSIS — G9332 Myalgic encephalomyelitis/chronic fatigue syndrome: Secondary | ICD-10-CM

## 2018-03-13 DIAGNOSIS — E538 Deficiency of other specified B group vitamins: Secondary | ICD-10-CM | POA: Diagnosis not present

## 2018-03-13 MED ORDER — SIMVASTATIN 40 MG PO TABS: ORAL_TABLET | ORAL | 3 refills | Status: DC

## 2018-03-13 NOTE — Assessment & Plan Note (Signed)
Vitamin D level is therapeutic with current supplementation Disc importance of this to bone and overall health Level of 44  

## 2018-03-13 NOTE — Assessment & Plan Note (Signed)
Stable dexa 8/18  No falls or fx Completed course of evista in 2014 Disc fall prev Continue ca and D  D level is tx

## 2018-03-13 NOTE — Progress Notes (Signed)
Subjective:    Patient ID: Tracy [name], female    DOB: Oct 20, 1950, 67 y.o.   MRN: [id number]  HPI Here for annual f/u of chronic health conditions  Her grand daughter is going to kindergarten  She is going to have some time for self care now   Chronic fatigue syndrome is still very bothersome   Wt Readings from Last 3 Encounters:  03/13/18 114 lb 4 oz (51.8 kg)  03/08/18 113 lb 12 oz (51.6 kg)  03/03/17 114 lb 4 oz (51.8 kg)  wt is stable  She eats regularly- she eats until she gets full  Exercise - none/ cannot get past the fatigue/chronic (has improved enough to do things in the house)  She tries to get enough sleep  20.56 kg/m   occ ST and allergies occ headaches    amw was 8/15 No gaps or concerns   Will get a flu shot in the fall   Has mammogram scheduled for sept when due  Self breast exam -no lumps or changes   Colonoscopy 9/15- 10 y recall   dexa 9/18 -osteopenia stable  Completed 5 y of evista in 2014 D level is 44 No falls or fracture  Needs to exercise   Blood pressure BP Readings from Last 3 Encounters:  03/13/18 118/72  03/08/18 116/64  03/03/17 (!) 102/58   Pulse Readings from Last 3 Encounters:  03/13/18 (!) 58  03/08/18 (!) 57  03/03/17 66     zostavax 7/13  Hyperlipidemia Lab Results  Component Value Date   CHOL 173 03/08/2018   CHOL 177 02/21/2017   CHOL 166 02/18/2016   Lab Results  Component Value Date   HDL 72.80 03/08/2018   HDL 71.70 02/21/2017   HDL 67.40 02/18/2016   Lab Results  Component Value Date   LDLCALC 87 03/08/2018   LDLCALC 90 02/21/2017   LDLCALC 82 02/18/2016   Lab Results  Component Value Date   TRIG 68.0 03/08/2018   TRIG 74.0 02/21/2017   TRIG 83.0 02/18/2016   Lab Results  Component Value Date   CHOLHDL 2 03/08/2018   CHOLHDL 2 02/21/2017   CHOLHDL 2 02/18/2016   Lab Results  Component Value Date   LDLDIRECT 172.6 05/20/2013  simvastatin and diet  Well controlled   B12  def Lab Results  Component Value Date   VITAMINB12 1,105 (H) 03/08/2018  she will cut back dose Oral supplementation  Lab Results  Component Value Date   WBC 5.6 03/08/2018   HGB 13.8 03/08/2018   HCT 41.3 03/08/2018   MCV 92.5 03/08/2018   PLT 182.0 03/08/2018   Lab Results  Component Value Date   CREATININE 0.75 03/08/2018   BUN 11 03/08/2018   NA 143 03/08/2018   K 4.3 03/08/2018   CL 107 03/08/2018   CO2 31 03/08/2018   Lab Results  Component Value Date   ALT 16 03/08/2018   AST 21 03/08/2018   ALKPHOS 38 (L) 03/08/2018   BILITOT 0.6 03/08/2018    Lab Results  Component Value Date   TSH 1.68 03/08/2018   hep C screen is negative    Patient Active Problem List   Diagnosis Date Noted  . Welcome to Medicare preventive visit 03/03/2017  . Estrogen deficiency 03/03/2017  . Borderline low blood pressure determined by examination 10/31/2016  . Vitamin B12 deficiency 10/31/2016  . Chest pain with moderate risk for cardiac etiology 10/17/2014  . Colon cancer screening 02/17/2014  . Other screening  mammogram 02/07/2011  . Routine general medical examination at a health care facility 02/01/2011  . Vitamin D deficiency 04/14/2008  . Osteopenia 01/09/2007  . Hyperlipidemia with target LDL less than 130 01/04/2007  . VAGINITIS, ATROPHIC 01/04/2007  . SCOLIOSIS 01/04/2007  . CHRONIC FATIGUE SYNDROME 01/04/2007   Past Medical History:  Diagnosis Date  . Chronic fatigue syndrome   . Hyperlipidemia   . Osteopenia   . S/P cervical polypectomy    Past Surgical History:  Procedure Laterality Date  . ABDOMINAL HYSTERECTOMY     one ovary removed/ bleeding  . DILATION AND CURETTAGE OF UTERUS     Social History   Tobacco Use  . Smoking status: Never Smoker  . Smokeless tobacco: Never Used  Substance Use Topics  . Alcohol use: No    Alcohol/week: 0.0 standard drinks  . Drug use: No   Family History  Problem Relation Age of Onset  . Stroke Mother   .  Hypertension Father   . Stroke Father   . Hyperlipidemia Sister   . Hyperlipidemia Brother   . Cancer Paternal Uncle   . Cancer Maternal Grandmother        intestinal CA  . Cancer Paternal Grandfather        CA unsure which kind  . Hyperlipidemia Brother   . Heart attack Sister        Thought to be silent MI  . Breast cancer Neg Hx    Allergies  Allergen Reactions  . Alendronate Sodium     REACTION: GI  . Amoxicillin     REACTION: vomiting  . Ibuprofen     REACTION: rash  . Ivp Dye [Iodinated Diagnostic Agents]    Current Outpatient Medications on File Prior to Visit  Medication Sig Dispense Refill  . Calcium-Vitamin D-Vitamin K (VIACTIV CALCIUM PLUS D) 650-12.5-40 MG-MCG-MCG CHEW Chew 2 tablets by mouth daily.    . Cholecalciferol (VITAMIN D) 2000 UNITS CAPS Take 1 capsule by mouth daily.      . cyanocobalamin 1000 MCG tablet Take 1,000 mcg by mouth daily.     No current facility-administered medications on file prior to visit.     Review of Systems  Constitutional: Positive for fatigue. Negative for activity change, appetite change, fever and unexpected weight change.  HENT: Negative for congestion, ear pain, rhinorrhea, sinus pressure and sore throat.   Eyes: Negative for pain, redness and visual disturbance.  Respiratory: Negative for cough, shortness of breath and wheezing.   Cardiovascular: Negative for chest pain and palpitations.  Gastrointestinal: Negative for abdominal pain, blood in stool, constipation and diarrhea.  Endocrine: Negative for polydipsia and polyuria.  Genitourinary: Negative for dysuria, frequency and urgency.  Musculoskeletal: Negative for arthralgias, back pain and myalgias.  Skin: Negative for pallor and rash.  Allergic/Immunologic: Negative for environmental allergies.  Neurological: Negative for dizziness, syncope and headaches.  Hematological: Negative for adenopathy. Does not bruise/bleed easily.  Psychiatric/Behavioral: Negative for  decreased concentration and dysphoric mood. The patient is not nervous/anxious.        Objective:   Physical Exam  Constitutional: She appears well-developed and well-nourished. No distress.  Slim and well appearing   HENT:  Head: Normocephalic and atraumatic.  Right Ear: External ear normal.  Left Ear: External ear normal.  Mouth/Throat: Oropharynx is clear and moist.  Eyes: Pupils are equal, round, and reactive to light. Conjunctivae and EOM are normal. No scleral icterus.  Neck: Normal range of motion. Neck supple. No JVD present. Carotid bruit is  not present. No thyromegaly present.  Cardiovascular: Normal rate, regular rhythm, normal heart sounds and intact distal pulses. Exam reveals no gallop.  Pulmonary/Chest: Effort normal and breath sounds normal. No respiratory distress. She has no wheezes. She exhibits no tenderness. No breast tenderness, discharge or bleeding.  Abdominal: Soft. Bowel sounds are normal. She exhibits no distension, no abdominal bruit and no mass. There is no tenderness.  Genitourinary: No breast tenderness, discharge or bleeding.  Genitourinary Comments: Breast exam: No mass, nodules, thickening, tenderness, bulging, retraction, inflamation, nipple discharge or skin changes noted.  No axillary or clavicular LA.      Musculoskeletal: Normal range of motion. She exhibits no edema or tenderness.  No kyphosis   Lymphadenopathy:    She has no cervical adenopathy.  Neurological: She is alert. She has normal reflexes. She displays normal reflexes. No cranial nerve deficit. She exhibits normal muscle tone. Coordination normal.  No tremor   Skin: Skin is warm and dry. No rash noted. No erythema. No pallor.  Solar lentigines diffusely  Few sks  Psychiatric: She has a normal mood and affect.  Pleasant and cheerful  Tearful when she discusses past stress of moving           Assessment & Plan:   Problem List Items Addressed This Visit      Nervous and Auditory    CHRONIC FATIGUE SYNDROME    For years - waxes and wanes - sometimes worse after a stressor like moving  Labs and exam stable Enc her to start 5-10 min of walking or low impact exercise per day to see how she tolerates it         Musculoskeletal and Integument   Osteopenia - Primary    Stable dexa 8/18  No falls or fx Completed course of evista in 2014 Disc fall prev Continue ca and D  D level is tx        Other   Hyperlipidemia with target LDL less than 130 (Chronic)    Disc goals for lipids and reasons to control them Rev last labs with pt Rev low sat fat diet in detail Great control with statin and diet       Relevant Medications   simvastatin (ZOCOR) 40 MG tablet   Vitamin B12 deficiency    Level is high  Will cut oral suppl down to 500 mcg daily       Vitamin D deficiency    Vitamin D level is therapeutic with current supplementation Disc importance of this to bone and overall health Level of 44

## 2018-03-13 NOTE — Assessment & Plan Note (Signed)
For years - waxes and wanes - sometimes worse after a stressor like moving  Labs and exam stable Enc her to start 5-10 min of walking or low impact exercise per day to see how she tolerates it

## 2018-03-13 NOTE — Assessment & Plan Note (Signed)
Level is high  Will cut oral suppl down to 500 mcg daily

## 2018-03-13 NOTE — Patient Instructions (Addendum)
Try to push through fatigue and deconditioning - 5-10 minutes of walking or other exercise per day   Don't forget to get a flu shot in the fall   You can cut B12 down to 500 mcg daily   Keep taking care of yourself

## 2018-03-13 NOTE — Assessment & Plan Note (Signed)
Disc goals for lipids and reasons to control them Rev last labs with pt Rev low sat fat diet in detail Great control with statin and diet

## 2018-04-25 ENCOUNTER — Ambulatory Visit
Admission: RE | Admit: 2018-04-25 | Discharge: 2018-04-25 | Disposition: A | Discharge status: Home / Self Care | Payer: Medicare Other | Source: Ambulatory Visit | Attending: Family Medicine | Admitting: Family Medicine

## 2018-04-25 DIAGNOSIS — Z1231 Encounter for screening mammogram for malignant neoplasm of breast: Secondary | ICD-10-CM

## 2018-04-26 ENCOUNTER — Ambulatory Visit (INDEPENDENT_AMBULATORY_CARE_PROVIDER_SITE_OTHER): Payer: Medicare Other

## 2018-04-26 DIAGNOSIS — Z23 Encounter for immunization: Secondary | ICD-10-CM

## 2018-08-21 DIAGNOSIS — D2262 Melanocytic nevi of left upper limb, including shoulder: Secondary | ICD-10-CM | POA: Diagnosis not present

## 2018-08-21 DIAGNOSIS — L570 Actinic keratosis: Secondary | ICD-10-CM | POA: Diagnosis not present

## 2018-08-21 DIAGNOSIS — D225 Melanocytic nevi of trunk: Secondary | ICD-10-CM | POA: Diagnosis not present

## 2018-08-21 DIAGNOSIS — L821 Other seborrheic keratosis: Secondary | ICD-10-CM | POA: Diagnosis not present

## 2018-08-21 DIAGNOSIS — D2271 Melanocytic nevi of right lower limb, including hip: Secondary | ICD-10-CM | POA: Diagnosis not present

## 2018-08-21 DIAGNOSIS — D2272 Melanocytic nevi of left lower limb, including hip: Secondary | ICD-10-CM | POA: Diagnosis not present

## 2018-08-21 DIAGNOSIS — D2261 Melanocytic nevi of right upper limb, including shoulder: Secondary | ICD-10-CM | POA: Diagnosis not present

## 2019-03-18 ENCOUNTER — Ambulatory Visit (INDEPENDENT_AMBULATORY_CARE_PROVIDER_SITE_OTHER): Payer: Medicare HMO

## 2019-03-18 ENCOUNTER — Ambulatory Visit: Payer: Medicare Other

## 2019-03-18 VITALS — BP 116/54 | HR 70 | Temp 97.5°F | Ht 62.5 in | Wt 112.0 lb

## 2019-03-18 DIAGNOSIS — Z0000 Encounter for general adult medical examination without abnormal findings: Secondary | ICD-10-CM | POA: Diagnosis not present

## 2019-03-18 NOTE — Patient Instructions (Signed)
Tracy [name] , Thank you for taking time to come for your Medicare Wellness Visit. I appreciate your ongoing commitment to your health goals. Please review the following plan we discussed and let me know if I can assist you in the future.   Screening recommendations/referrals: Colonoscopy: 03/2014 Mammogram: 04/2018 Bone Density: 03/2017 Recommended yearly ophthalmology/optometry visit for glaucoma screening and checkup Recommended yearly dental visit for hygiene and checkup  Vaccinations: Influenza vaccine: 04/2018 Pneumococcal vaccine: 02/2018 Tdap vaccine: 03/2017 Shingles vaccine: 01/2012    Advanced directives: Advance directive discussed with you today.   Conditions/risks identified: none  Next appointment: 03/25/2019 at 9:30   Preventive Care 65 Years and Older, Female Preventive care refers to lifestyle choices and visits with your health care provider that can promote health and wellness. What does preventive care include?  A yearly physical exam. This is also called an annual well check.  Dental exams once or twice a year.  Routine eye exams. Ask your health care provider how often you should have your eyes checked.  Personal lifestyle choices, including:  Daily care of your teeth and gums.  Regular physical activity.  Eating a healthy diet.  Avoiding tobacco and drug use.  Limiting alcohol use.  Practicing safe sex.  Taking low-dose aspirin every day.  Taking vitamin and mineral supplements as recommended by your health care provider. What happens during an annual well check? The services and screenings done by your health care provider during your annual well check will depend on your age, overall health, lifestyle risk factors, and family history of disease. Counseling  Your health care provider may ask you questions about your:  Alcohol use.  Tobacco use.  Drug use.  Emotional well-being.  Home and relationship well-being.  Sexual activity.   Eating habits.  History of falls.  Memory and ability to understand (cognition).  Work and work Statistician.  Reproductive health. Screening  You may have the following tests or measurements:  Height, weight, and BMI.  Blood pressure.  Lipid and cholesterol levels. These may be checked every 5 years, or more frequently if you are over 43 years old.  Skin check.  Lung cancer screening. You may have this screening every year starting at age 21 if you have a 30-pack-year history of smoking and currently smoke or have quit within the past 15 years.  Fecal occult blood test (FOBT) of the stool. You may have this test every year starting at age 24.  Flexible sigmoidoscopy or colonoscopy. You may have a sigmoidoscopy every 5 years or a colonoscopy every 10 years starting at age 46.  Hepatitis C blood test.  Hepatitis B blood test.  Sexually transmitted disease (STD) testing.  Diabetes screening. This is done by checking your blood sugar (glucose) after you have not eaten for a while (fasting). You may have this done every 1-3 years.  Bone density scan. This is done to screen for osteoporosis. You may have this done starting at age 34.  Mammogram. This may be done every 1-2 years. Talk to your health care provider about how often you should have regular mammograms. Talk with your health care provider about your test results, treatment options, and if necessary, the need for more tests. Vaccines  Your health care provider may recommend certain vaccines, such as:  Influenza vaccine. This is recommended every year.  Tetanus, diphtheria, and acellular pertussis (Tdap, Td) vaccine. You may need a Td booster every 10 years.  Zoster vaccine. You may need this after age  60.  Pneumococcal 13-valent conjugate (PCV13) vaccine. One dose is recommended after age 64.  Pneumococcal polysaccharide (PPSV23) vaccine. One dose is recommended after age 43. Talk to your health care provider  about which screenings and vaccines you need and how often you need them. This information is not intended to replace advice given to you by your health care provider. Make sure you discuss any questions you have with your health care provider. Document Released: 08/07/2015 Document Revised: 03/30/2016 Document Reviewed: 05/12/2015 Elsevier Interactive Patient Education  2017 [place] Prevention in the Home Falls can cause injuries. They can happen to people of all ages. There are many things you can do to make your home safe and to help prevent falls. What can I do on the outside of my home?  Regularly fix the edges of walkways and driveways and fix any cracks.  Remove anything that might make you trip as you walk through a door, such as a raised step or threshold.  Trim any bushes or trees on the path to your home.  Use bright outdoor lighting.  Clear any walking paths of anything that might make someone trip, such as rocks or tools.  Regularly check to see if handrails are loose or broken. Make sure that both sides of any steps have handrails.  Any raised decks and porches should have guardrails on the edges.  Have any leaves, snow, or ice cleared regularly.  Use sand or salt on walking paths during winter.  Clean up any spills in your garage right away. This includes oil or grease spills. What can I do in the bathroom?  Use night lights.  Install grab bars by the toilet and in the tub and shower. Do not use towel bars as grab bars.  Use non-skid mats or decals in the tub or shower.  If you need to sit down in the shower, use a plastic, non-slip stool.  Keep the floor dry. Clean up any water that spills on the floor as soon as it happens.  Remove soap buildup in the tub or shower regularly.  Attach bath mats securely with double-sided non-slip rug tape.  Do not have throw rugs and other things on the floor that can make you trip. What can I do in the  bedroom?  Use night lights.  Make sure that you have a light by your bed that is easy to reach.  Do not use any sheets or blankets that are too big for your bed. They should not hang down onto the floor.  Have a firm chair that has side arms. You can use this for support while you get dressed.  Do not have throw rugs and other things on the floor that can make you trip. What can I do in the kitchen?  Clean up any spills right away.  Avoid walking on wet floors.  Keep items that you use a lot in easy-to-reach places.  If you need to reach something above you, use a strong step stool that has a grab bar.  Keep electrical cords out of the way.  Do not use floor polish or wax that makes floors slippery. If you must use wax, use non-skid floor wax.  Do not have throw rugs and other things on the floor that can make you trip. What can I do with my stairs?  Do not leave any items on the stairs.  Make sure that there are handrails on both sides of the stairs and  use them. Fix handrails that are broken or loose. Make sure that handrails are as long as the stairways.  Check any carpeting to make sure that it is firmly attached to the stairs. Fix any carpet that is loose or worn.  Avoid having throw rugs at the top or bottom of the stairs. If you do have throw rugs, attach them to the floor with carpet tape.  Make sure that you have a light switch at the top of the stairs and the bottom of the stairs. If you do not have them, ask someone to add them for you. What else can I do to help prevent falls?  Wear shoes that:  Do not have high heels.  Have rubber bottoms.  Are comfortable and fit you well.  Are closed at the toe. Do not wear sandals.  If you use a stepladder:  Make sure that it is fully opened. Do not climb a closed stepladder.  Make sure that both sides of the stepladder are locked into place.  Ask someone to hold it for you, if possible.  Clearly mark and make  sure that you can see:  Any grab bars or handrails.  First and last steps.  Where the edge of each step is.  Use tools that help you move around (mobility aids) if they are needed. These include:  Canes.  Walkers.  Scooters.  Crutches.  Turn on the lights when you go into a dark area. Replace any light bulbs as soon as they burn out.  Set up your furniture so you have a clear path. Avoid moving your furniture around.  If any of your floors are uneven, fix them.  If there are any pets around you, be aware of where they are.  Review your medicines with your doctor. Some medicines can make you feel dizzy. This can increase your chance of falling. Ask your doctor what other things that you can do to help prevent falls. This information is not intended to replace advice given to you by your health care provider. Make sure you discuss any questions you have with your health care provider. Document Released: 05/07/2009 Document Revised: 12/17/2015 Document Reviewed: 08/15/2014 Elsevier Interactive Patient Education  2017 [employer].

## 2019-03-18 NOTE — Progress Notes (Signed)
PCP notes:  Health Maintenance:  No gaps  Abnormal Screenings:  None  Patient concerns:  None  Nurse concerns:  None  Next PCP appt.: 03/25/2019 at 9:30

## 2019-03-18 NOTE — Progress Notes (Signed)
Subjective:   Tracy [name] is a 68 y.o. female who presents for Medicare Annual (Subsequent) preventive examination.  This visit type was conducted due to national recommendations for restrictions regarding the COVID-19 Pandemic (e.g. social distancing). This format is felt to be most appropriate for this patient at this time. All issues noted in this document were discussed and addressed. No physical exam was performed (except for noted visual exam findings with Video Visits). This patient, [name], has given permission to perform this visit via telephone. Vital signs may be absent or patient reported.  Patient location:  At home  Nurse location:  At home     Review of Systems:  n/a Cardiac Risk Factors include: advanced age (>72men, >85 women);dyslipidemia;sedentary lifestyle     Objective:     Vitals: BP (!) 116/54 Comment: per patient  Pulse 70 Comment: per patient  Temp (!) 97.5 F (36.4 C) Comment: per patient  Ht 5' 2.5" (1.588 m) Comment: per patient  Wt 112 lb (50.8 kg) Comment: per patient  BMI 20.16 kg/m   Body mass index is 20.16 kg/m.  Advanced Directives 03/18/2019 03/08/2018  Does Patient Have a Medical Advance Directive? No No  Would patient like information on creating a medical advance directive? - No - Patient declined    Tobacco Social History   Tobacco Use  Smoking Status Never Smoker  Smokeless Tobacco Never Used     Counseling given: Not Answered   Clinical Intake:  Pre-visit preparation completed: Yes  Pain : No/denies pain     Nutritional Status: BMI of 19-24  Normal Nutritional Risks: None Diabetes: No  How often do you need to have someone help you when you read instructions, pamphlets, or other written materials from your doctor or pharmacy?: 1 - Never What is the last grade level you completed in school?: some college  Interpreter Needed?: No  Information entered by :: NAllen LPN  Past Medical History:   Diagnosis Date  . Chronic fatigue syndrome   . Hyperlipidemia   . Osteopenia   . S/P cervical polypectomy    Past Surgical History:  Procedure Laterality Date  . ABDOMINAL HYSTERECTOMY     one ovary removed/ bleeding  . DILATION AND CURETTAGE OF UTERUS     Family History  Problem Relation Age of Onset  . Stroke Mother   . Hypertension Father   . Stroke Father   . Hyperlipidemia Sister   . Hyperlipidemia Brother   . Cancer Paternal Uncle   . Cancer Maternal Grandmother        intestinal CA  . Cancer Paternal Grandfather        CA unsure which kind  . Hyperlipidemia Brother   . Heart attack Sister        Thought to be silent MI  . Breast cancer Neg Hx    Social History   Socioeconomic History  . Marital status: Married    Spouse name: Not on file  . Number of children: Not on file  . Years of education: Not on file  . Highest education level: Not on file  Occupational History  . Not on file  Social Needs  . Financial resource strain: Not hard at all  . Food insecurity    Worry: Never true    Inability: Never true  . Transportation needs    Medical: No    Non-medical: No  Tobacco Use  . Smoking status: Never Smoker  . Smokeless tobacco: Never Used  Substance and Sexual Activity  . Alcohol use: No    Alcohol/week: 0.0 standard drinks  . Drug use: No  . Sexual activity: Yes    Partners: Female  Lifestyle  . Physical activity    Days per week: 0 days    Minutes per session: 0 min  . Stress: Not at all  Relationships  . Social Herbalist on phone: Not on file    Gets together: Not on file    Attends religious service: Not on file    Active member of club or organization: Not on file    Attends meetings of clubs or organizations: Not on file    Relationship status: Not on file  Other Topics Concern  . Not on file  Social History Narrative  . Not on file    Outpatient Encounter Medications as of 03/18/2019  Medication Sig  .  Calcium-Vitamin D-Vitamin K (VIACTIV CALCIUM PLUS D) 650-12.5-40 MG-MCG-MCG CHEW Chew 2 tablets by mouth daily.  . Cholecalciferol (VITAMIN D) 2000 UNITS CAPS Take 1 capsule by mouth daily.    . cyanocobalamin 1000 MCG tablet Take 1,000 mcg by mouth daily.  . simvastatin (ZOCOR) 40 MG tablet TAKE (1) TABLET BY MOUTH DAILY AT BEDTIME   No facility-administered encounter medications on file as of 03/18/2019.     Activities of Daily Living In your present state of health, do you have any difficulty performing the following activities: 03/18/2019  Hearing? N  Vision? N  Difficulty concentrating or making decisions? N  Walking or climbing stairs? N  Dressing or bathing? N  Doing errands, shopping? N  Preparing Food and eating ? N  Using the Toilet? N  In the past six months, have you accidently leaked urine? N  Do you have problems with loss of bowel control? N  Managing your Medications? N  Managing your Finances? N  Housekeeping or managing your Housekeeping? N  Some recent data might be hidden    Patient Care Team: Tower, Wynelle Fanny, MD as PCP - General    Assessment:   This is a routine wellness examination for Tracy [name].  Exercise Activities and Dietary recommendations Current Exercise Habits: The patient does not participate in regular exercise at present  Goals    . Patient Stated     Starting 03/08/2018, I will continue to take medications as prescribed.     . Patient Stated     03/18/2019, wants to get in to better shape       Fall Risk Fall Risk  03/18/2019 03/08/2018 03/03/2017 02/17/2014  Falls in the past year? 0 No No No  Follow up Falls evaluation completed;Falls prevention discussed - - -   Is the patient's home free of loose throw rugs in walkways, pet beds, electrical cords, etc?   yes      Grab bars in the bathroom? no      Handrails on the stairs?   yes      Adequate lighting?   yes  Timed Get Up and Go performed: n/a  Depression Screen PHQ 2/9 Scores  03/18/2019 03/08/2018 03/03/2017 02/17/2014  PHQ - 2 Score 0 0 0 0  PHQ- 9 Score 0 0 4 -     Cognitive Function MMSE - Mini Mental State Exam 03/18/2019 03/08/2018  Orientation to time 5 5  Orientation to Place 5 5  Registration 3 3  Attention/ Calculation 5 0  Recall 3 3  Language- name 2 objects 0 0  Language-  repeat 0 1  Language- follow 3 step command 0 3  Language- read & follow direction 0 0  Write a sentence 0 0  Copy design 0 0  Total score 21 20   Mini Cog  Mini-Cog screen was completed. Maximum score is 22. A value of 0 denotes this part of the MMSE was not completed or the patient failed this part of the Mini-Cog screening.       Immunization History  Administered Date(s) Administered  . Influenza, High Dose Seasonal PF 03/30/2017  . Influenza,inj,Quad PF,6+ Mos 09/08/2016, 04/26/2018  . Influenza-Unspecified 04/24/2013, 05/05/2014, 05/04/2015  . Pneumococcal Conjugate-13 03/03/2017  . Pneumococcal Polysaccharide-23 03/08/2018  . Td 07/25/1996, 01/09/2007, 03/30/2017  . Zoster 02/14/2012    Qualifies for Shingles Vaccine? yes  Screening Tests Health Maintenance  Topic Date Due  . INFLUENZA VACCINE  02/23/2019  . MAMMOGRAM  04/26/2019  . COLONOSCOPY  04/11/2024  . TETANUS/TDAP  03/31/2027  . DEXA SCAN  Completed  . Hepatitis C Screening  Completed  . PNA vac Low Risk Adult  Completed    Cancer Screenings: Lung: Low Dose CT Chest recommended if Age 87-80 years, 30 pack-year currently smoking OR have quit w/in 15years. Patient does not qualify. Breast:  Up to date on Mammogram? Yes   Up to date of Bone Density/Dexa? Yes Colorectal: up to date  Additional Screenings: : Hepatitis C Screening: 02/2018     Plan:    Patient wants to get in to better shape.   I have personally reviewed and noted the following in the patient's chart:   . Medical and social history . Use of alcohol, tobacco or illicit drugs  . Current medications and supplements .  Functional ability and status . Nutritional status . Physical activity . Advanced directives . List of other physicians . Hospitalizations, surgeries, and ER visits in previous 12 months . Vitals . Screenings to include cognitive, depression, and falls . Referrals and appointments  In addition, I have reviewed and discussed with patient certain preventive protocols, quality metrics, and best practice recommendations. A written personalized care plan for preventive services as well as general preventive health recommendations were provided to patient.     Kellie Simmering, LPN  [id number]

## 2019-03-20 ENCOUNTER — Telehealth: Payer: Self-pay | Admitting: Family Medicine

## 2019-03-20 DIAGNOSIS — Z0000 Encounter for general adult medical examination without abnormal findings: Secondary | ICD-10-CM

## 2019-03-20 DIAGNOSIS — E785 Hyperlipidemia, unspecified: Secondary | ICD-10-CM

## 2019-03-20 DIAGNOSIS — E559 Vitamin D deficiency, unspecified: Secondary | ICD-10-CM

## 2019-03-20 DIAGNOSIS — E538 Deficiency of other specified B group vitamins: Secondary | ICD-10-CM

## 2019-03-20 NOTE — Telephone Encounter (Signed)
-----   Message from Cloyd Stagers, RT sent at 03/12/2019 10:52 AM EDT ----- Regarding: Lab Orders for Thursday 8.27.2020 Please place lab orders for Thursday 8.27.2020, office visit for physical on Monday 8.31.2020 Thank you, Dyke Maes RT(R)

## 2019-03-21 ENCOUNTER — Other Ambulatory Visit: Payer: Self-pay

## 2019-03-21 ENCOUNTER — Other Ambulatory Visit (INDEPENDENT_AMBULATORY_CARE_PROVIDER_SITE_OTHER): Payer: Medicare HMO

## 2019-03-21 DIAGNOSIS — E559 Vitamin D deficiency, unspecified: Secondary | ICD-10-CM | POA: Diagnosis not present

## 2019-03-21 DIAGNOSIS — E785 Hyperlipidemia, unspecified: Secondary | ICD-10-CM

## 2019-03-21 DIAGNOSIS — Z0000 Encounter for general adult medical examination without abnormal findings: Secondary | ICD-10-CM | POA: Diagnosis not present

## 2019-03-21 DIAGNOSIS — E538 Deficiency of other specified B group vitamins: Secondary | ICD-10-CM

## 2019-03-21 LAB — CBC WITH DIFFERENTIAL/PLATELET
Basophils Absolute: 0.1 10*3/uL (ref 0.0–0.1)
Basophils Relative: 1 % (ref 0.0–3.0)
Eosinophils Absolute: 0.1 10*3/uL (ref 0.0–0.7)
Eosinophils Relative: 2.2 % (ref 0.0–5.0)
HCT: 43.1 % (ref 36.0–46.0)
Hemoglobin: 14.1 g/dL (ref 12.0–15.0)
Lymphocytes Relative: 44.6 % (ref 12.0–46.0)
Lymphs Abs: 2.6 10*3/uL (ref 0.7–4.0)
MCHC: 32.8 g/dL (ref 30.0–36.0)
MCV: 94.7 fl (ref 78.0–100.0)
Monocytes Absolute: 0.5 10*3/uL (ref 0.1–1.0)
Monocytes Relative: 9.2 % (ref 3.0–12.0)
Neutro Abs: 2.5 10*3/uL (ref 1.4–7.7)
Neutrophils Relative %: 43 % (ref 43.0–77.0)
Platelets: 189 10*3/uL (ref 150.0–400.0)
RBC: 4.55 Mil/uL (ref 3.87–5.11)
RDW: 13.6 % (ref 11.5–15.5)
WBC: 5.8 10*3/uL (ref 4.0–10.5)

## 2019-03-21 LAB — COMPREHENSIVE METABOLIC PANEL
ALT: 21 U/L (ref 0–35)
AST: 24 U/L (ref 0–37)
Albumin: 4.3 g/dL (ref 3.5–5.2)
Alkaline Phosphatase: 39 U/L (ref 39–117)
BUN: 9 mg/dL (ref 6–23)
CO2: 30 mEq/L (ref 19–32)
Calcium: 9.3 mg/dL (ref 8.4–10.5)
Chloride: 106 mEq/L (ref 96–112)
Creatinine, Ser: 0.68 mg/dL (ref 0.40–1.20)
GFR: 86.22 mL/min (ref >60.00)
Glucose, Bld: 80 mg/dL (ref 70–99)
Potassium: 4.2 mEq/L (ref 3.5–5.1)
Sodium: 143 mEq/L (ref 135–145)
Total Bilirubin: 0.7 mg/dL (ref 0.2–1.2)
Total Protein: 6.7 g/dL (ref 6.0–8.3)

## 2019-03-21 LAB — LIPID PANEL
Cholesterol: 188 mg/dL (ref 0–200)
HDL: 75 mg/dL (ref >39.00)
LDL Cholesterol: 95 mg/dL (ref 0–99)
NonHDL: 112.79
Total CHOL/HDL Ratio: 3
Triglycerides: 90 mg/dL (ref 0.0–149.0)
VLDL: 18 mg/dL (ref 0.0–40.0)

## 2019-03-21 LAB — TSH: TSH: 2.16 u[IU]/mL (ref 0.35–4.50)

## 2019-03-21 LAB — VITAMIN D 25 HYDROXY (VIT D DEFICIENCY, FRACTURES): VITD: 43.86 ng/mL (ref 30.00–100.00)

## 2019-03-21 LAB — VITAMIN B12: Vitamin B-12: 565 pg/mL (ref 211–911)

## 2019-03-25 ENCOUNTER — Other Ambulatory Visit: Payer: Self-pay | Admitting: Family Medicine

## 2019-03-25 ENCOUNTER — Encounter: Payer: Self-pay | Admitting: Family Medicine

## 2019-03-25 ENCOUNTER — Ambulatory Visit (INDEPENDENT_AMBULATORY_CARE_PROVIDER_SITE_OTHER): Payer: Medicare HMO | Admitting: Family Medicine

## 2019-03-25 ENCOUNTER — Other Ambulatory Visit: Payer: Self-pay

## 2019-03-25 VITALS — BP 98/64 | HR 91 | Temp 97.7°F | Ht 62.75 in | Wt 113.6 lb

## 2019-03-25 DIAGNOSIS — Z0001 Encounter for general adult medical examination with abnormal findings: Secondary | ICD-10-CM

## 2019-03-25 DIAGNOSIS — Z1231 Encounter for screening mammogram for malignant neoplasm of breast: Secondary | ICD-10-CM

## 2019-03-25 DIAGNOSIS — E538 Deficiency of other specified B group vitamins: Secondary | ICD-10-CM

## 2019-03-25 DIAGNOSIS — R5382 Chronic fatigue, unspecified: Secondary | ICD-10-CM

## 2019-03-25 DIAGNOSIS — M8589 Other specified disorders of bone density and structure, multiple sites: Secondary | ICD-10-CM

## 2019-03-25 DIAGNOSIS — E559 Vitamin D deficiency, unspecified: Secondary | ICD-10-CM

## 2019-03-25 DIAGNOSIS — Z0000 Encounter for general adult medical examination without abnormal findings: Secondary | ICD-10-CM

## 2019-03-25 DIAGNOSIS — E785 Hyperlipidemia, unspecified: Secondary | ICD-10-CM

## 2019-03-25 DIAGNOSIS — Z23 Encounter for immunization: Secondary | ICD-10-CM | POA: Diagnosis not present

## 2019-03-25 DIAGNOSIS — G9332 Myalgic encephalomyelitis/chronic fatigue syndrome: Secondary | ICD-10-CM

## 2019-03-25 NOTE — Assessment & Plan Note (Signed)
Lab Results  Component Value Date   VITAMINB12 565 03/21/2019   Good level at 500 mcg oral B12 daily

## 2019-03-25 NOTE — Assessment & Plan Note (Signed)
No clinical changes Enc pt to be active when she can-safely in the house

## 2019-03-25 NOTE — Assessment & Plan Note (Signed)
dexa 9/18  Will wait another year for next one Completed course of evista in 2014 No falls or fx  Enc continues ca and D Exercise as tolerated

## 2019-03-25 NOTE — Assessment & Plan Note (Signed)
Vitamin D level is therapeutic with current supplementation Disc importance of this to bone and overall health Level of 43.8

## 2019-03-25 NOTE — Progress Notes (Signed)
Subjective:    Patient ID: Tracy [name], female    DOB: 1950-12-12, 68 y.o.   MRN: [id number]  HPI Here for health maintenance exam and to review chronic medical problems   Feeling ok -baseline chronic fatigue (takes breaks when needed)  Getting through the pandemic   Had amw on 8/24 No gaps or concerns  Wt Readings from Last 3 Encounters:  03/25/19 113 lb 9 oz (51.5 kg)  03/18/19 112 lb (50.8 kg)  03/13/18 114 lb 4 oz (51.8 kg)  would like to get more exercise  Is very active in the house  Wt is stable-eating healthy  20.28 kg/m  Flu vaccine -today/high dose  Mammogram 10/19  Self breast exam -no lumps   Colonoscopy 9/15  dexa 9/18 -will wait another year due to pandemic  Osteopenia  Completed course of evista in 2014 Falls-none  Fractures-none  Supplements ca abd /d  D level is 43.8    BP Readings from Last 3 Encounters:  03/25/19 98/64  03/18/19 (!) 116/54  03/13/18 118/72   Baseline low normal Feels ok   Lab Results  Component Value Date   CREATININE 0.68 03/21/2019   BUN 9 03/21/2019   NA 143 03/21/2019   K 4.2 03/21/2019   CL 106 03/21/2019   CO2 30 03/21/2019   Lab Results  Component Value Date   ALT 21 03/21/2019   AST 24 03/21/2019   ALKPHOS 39 03/21/2019   BILITOT 0.7 03/21/2019   Lab Results  Component Value Date   WBC 5.8 03/21/2019   HGB 14.1 03/21/2019   HCT 43.1 03/21/2019   MCV 94.7 03/21/2019   PLT 189.0 03/21/2019   Lab Results  Component Value Date   TSH 2.16 03/21/2019     Hyperlipidemia Lab Results  Component Value Date   CHOL 188 03/21/2019   CHOL 173 03/08/2018   CHOL 177 02/21/2017   Lab Results  Component Value Date   HDL 75.00 03/21/2019   HDL 72.80 03/08/2018   HDL 71.70 02/21/2017   Lab Results  Component Value Date   LDLCALC 95 03/21/2019   LDLCALC 87 03/08/2018   LDLCALC 90 02/21/2017   Lab Results  Component Value Date   TRIG 90.0 03/21/2019   TRIG 68.0 03/08/2018   TRIG 74.0  02/21/2017   Lab Results  Component Value Date   CHOLHDL 3 03/21/2019   CHOLHDL 2 03/08/2018   CHOLHDL 2 02/21/2017   Lab Results  Component Value Date   LDLDIRECT 172.6 05/20/2013   simvastatin and diet   B12 def Lab Results  Component Value Date   VITAMINB12 565 03/21/2019  oral supplementation 500 mcg daily    Patient Active Problem List   Diagnosis Date Noted  . Welcome to Medicare preventive visit 03/03/2017  . Estrogen deficiency 03/03/2017  . Borderline low blood pressure determined by examination 10/31/2016  . Vitamin B12 deficiency 10/31/2016  . Chest pain with moderate risk for cardiac etiology 10/17/2014  . Colon cancer screening 02/17/2014  . Other screening mammogram 02/07/2011  . Routine general medical examination at a health care facility 02/01/2011  . Vitamin D deficiency 04/14/2008  . Osteopenia 01/09/2007  . Hyperlipidemia with target LDL less than 130 01/04/2007  . VAGINITIS, ATROPHIC 01/04/2007  . SCOLIOSIS 01/04/2007  . CHRONIC FATIGUE SYNDROME 01/04/2007   Past Medical History:  Diagnosis Date  . Chronic fatigue syndrome   . Hyperlipidemia   . Osteopenia   . S/P cervical polypectomy    Past  Surgical History:  Procedure Laterality Date  . ABDOMINAL HYSTERECTOMY     one ovary removed/ bleeding  . DILATION AND CURETTAGE OF UTERUS     Social History   Tobacco Use  . Smoking status: Never Smoker  . Smokeless tobacco: Never Used  Substance Use Topics  . Alcohol use: No    Alcohol/week: 0.0 standard drinks  . Drug use: No   Family History  Problem Relation Age of Onset  . Stroke Mother   . Hypertension Father   . Stroke Father   . Hyperlipidemia Sister   . Hyperlipidemia Brother   . Cancer Paternal Uncle   . Cancer Maternal Grandmother        intestinal CA  . Cancer Paternal Grandfather        CA unsure which kind  . Hyperlipidemia Brother   . Heart attack Sister        Thought to be silent MI  . Breast cancer Neg Hx     Allergies  Allergen Reactions  . Alendronate Sodium     REACTION: GI  . Amoxicillin     REACTION: vomiting  . Ibuprofen     REACTION: rash  . Ivp Dye [Iodinated Diagnostic Agents]    Current Outpatient Medications on File Prior to Visit  Medication Sig Dispense Refill  . Calcium-Vitamin D-Vitamin K (VIACTIV CALCIUM PLUS D) 650-12.5-40 MG-MCG-MCG CHEW Chew 2 tablets by mouth daily.    . Cholecalciferol (VITAMIN D) 2000 UNITS CAPS Take 1 capsule by mouth daily.      . cyanocobalamin 1000 MCG tablet Take 500 mcg by mouth daily.     . simvastatin (ZOCOR) 40 MG tablet TAKE (1) TABLET BY MOUTH DAILY AT BEDTIME 90 tablet 3   No current facility-administered medications on file prior to visit.     Review of Systems  Constitutional: Positive for fatigue. Negative for activity change, appetite change, fever and unexpected weight change.  HENT: Negative for congestion, ear pain, rhinorrhea, sinus pressure and sore throat.   Eyes: Negative for pain, redness and visual disturbance.  Respiratory: Negative for cough, shortness of breath and wheezing.   Cardiovascular: Negative for chest pain and palpitations.  Gastrointestinal: Negative for abdominal pain, blood in stool, constipation and diarrhea.  Endocrine: Negative for polydipsia and polyuria.  Genitourinary: Negative for dysuria, frequency and urgency.  Musculoskeletal: Positive for arthralgias. Negative for back pain and myalgias.  Skin: Negative for pallor and rash.  Allergic/Immunologic: Negative for environmental allergies.  Neurological: Negative for dizziness, syncope and headaches.  Hematological: Negative for adenopathy. Does not bruise/bleed easily.  Psychiatric/Behavioral: Negative for decreased concentration and dysphoric mood. The patient is not nervous/anxious.        Objective:   Physical Exam Constitutional:      General: She is not in acute distress.    Appearance: Normal appearance. She is well-developed and normal  weight. She is not ill-appearing or diaphoretic.  HENT:     Head: Normocephalic and atraumatic.     Right Ear: Tympanic membrane, ear canal and external ear normal.     Left Ear: Tympanic membrane, ear canal and external ear normal.     Nose: Nose normal. No congestion.     Mouth/Throat:     Mouth: Mucous membranes are moist.     Pharynx: Oropharynx is clear. No posterior oropharyngeal erythema.  Eyes:     General: No scleral icterus.    Extraocular Movements: Extraocular movements intact.     Conjunctiva/sclera: Conjunctivae normal.  Pupils: Pupils are equal, round, and reactive to light.  Neck:     Musculoskeletal: Normal range of motion and neck supple. No neck rigidity or muscular tenderness.     Thyroid: No thyromegaly.     Vascular: No carotid bruit or JVD.  Cardiovascular:     Rate and Rhythm: Regular rhythm. Tachycardia present.     Pulses: Normal pulses.     Heart sounds: Normal heart sounds. No gallop.      Comments: Rate 91 Pulmonary:     Effort: Pulmonary effort is normal. No respiratory distress.     Breath sounds: Normal breath sounds. No wheezing or rales.     Comments: Good air exch Chest:     Chest wall: No tenderness.  Abdominal:     General: Bowel sounds are normal. There is no distension or abdominal bruit.     Palpations: Abdomen is soft. There is no mass.     Tenderness: There is no abdominal tenderness.     Hernia: No hernia is present.  Genitourinary:    Comments: Breast exam: No mass, nodules, thickening, tenderness, bulging, retraction, inflamation, nipple discharge or skin changes noted.  No axillary or clavicular LA.     Musculoskeletal: Normal range of motion.        General: No tenderness.     Right lower leg: No edema.     Left lower leg: No edema.  Lymphadenopathy:     Cervical: No cervical adenopathy.  Skin:    General: Skin is warm and dry.     Coloration: Skin is not pale.     Findings: No erythema or rash.     Comments: Solar  lentigines diffusely Some sks  Neurological:     Mental Status: She is alert. Mental status is at baseline.     Cranial Nerves: No cranial nerve deficit.     Motor: No abnormal muscle tone.     Coordination: Coordination normal.     Gait: Gait normal.     Deep Tendon Reflexes: Reflexes are normal and symmetric. Reflexes normal.  Psychiatric:        Mood and Affect: Mood normal.        Cognition and Memory: Cognition and memory normal.     Comments: Pleasant            Assessment & Plan:   Problem List Items Addressed This Visit      Nervous and Auditory   CHRONIC FATIGUE SYNDROME    No clinical changes Enc pt to be active when she can-safely in the house        Musculoskeletal and Integument   Osteopenia    dexa 9/18  Will wait another year for next one Completed course of evista in 2014 No falls or fx  Enc continues ca and D Exercise as tolerated         Other   Hyperlipidemia with target LDL less than 130 (Chronic)    Disc goals for lipids and reasons to control them Rev last labs with pt Rev low sat fat diet in detail  Well controlled with simvastatin and diet       Vitamin D deficiency    Vitamin D level is therapeutic with current supplementation Disc importance of this to bone and overall health Level of 43.8      Routine general medical examination at a health care facility - Primary    Reviewed health habits including diet and exercise and skin cancer prevention Reviewed appropriate  screening tests for age  Also reviewed health mt list, fam hx and immunization status , as well as social and family history   See HPI Labs reviewed amw reviewed Enc exercise as tolerated in home  High dose flu vaccine today  Pt will schedule her mammogram in October Putting off dexa due to pandemic        Relevant Orders   Flu Vaccine QUAD High Dose(Fluad) (Completed)   Vitamin B12 deficiency    Lab Results  Component Value Date   VITAMINB12 565  03/21/2019   Good level at 500 mcg oral B12 daily       Other Visit Diagnoses    Need for influenza vaccination       Relevant Orders   Flu Vaccine QUAD High Dose(Fluad) (Completed)

## 2019-03-25 NOTE — Assessment & Plan Note (Signed)
Disc goals for lipids and reasons to control them Rev last labs with pt Rev low sat fat diet in detail Well controlled with simvastatin and diet  

## 2019-03-25 NOTE — Patient Instructions (Addendum)
Don't forget to schedule your mammogram for October Keep eating a healthy diet  Think about an exercise program in the house (keep up calories as well)     Keep taking 500 mcg of B12 daily  Also continue D and calcium   Take care of yourself

## 2019-03-25 NOTE — Assessment & Plan Note (Signed)
Reviewed health habits including diet and exercise and skin cancer prevention Reviewed appropriate screening tests for age  Also reviewed health mt list, fam hx and immunization status , as well as social and family history   See HPI Labs reviewed amw reviewed Enc exercise as tolerated in home  High dose flu vaccine today  Pt will schedule her mammogram in October Putting off dexa due to pandemic

## 2019-04-09 DIAGNOSIS — H2513 Age-related nuclear cataract, bilateral: Secondary | ICD-10-CM | POA: Diagnosis not present

## 2019-04-29 ENCOUNTER — Ambulatory Visit
Admission: RE | Admit: 2019-04-29 | Discharge: 2019-04-29 | Disposition: A | Discharge status: Home / Self Care | Payer: Medicare HMO | Source: Ambulatory Visit | Attending: Family Medicine | Admitting: Family Medicine

## 2019-04-29 DIAGNOSIS — Z1231 Encounter for screening mammogram for malignant neoplasm of breast: Secondary | ICD-10-CM | POA: Diagnosis not present

## 2019-05-15 ENCOUNTER — Other Ambulatory Visit: Payer: Self-pay

## 2019-05-15 MED ORDER — SIMVASTATIN 40 MG PO TABS: ORAL_TABLET | ORAL | 3 refills | Status: DC

## 2019-08-20 DIAGNOSIS — L570 Actinic keratosis: Secondary | ICD-10-CM | POA: Diagnosis not present

## 2019-08-20 DIAGNOSIS — D2262 Melanocytic nevi of left upper limb, including shoulder: Secondary | ICD-10-CM | POA: Diagnosis not present

## 2019-08-20 DIAGNOSIS — D2271 Melanocytic nevi of right lower limb, including hip: Secondary | ICD-10-CM | POA: Diagnosis not present

## 2019-08-20 DIAGNOSIS — D2272 Melanocytic nevi of left lower limb, including hip: Secondary | ICD-10-CM | POA: Diagnosis not present

## 2019-08-20 DIAGNOSIS — D2261 Melanocytic nevi of right upper limb, including shoulder: Secondary | ICD-10-CM | POA: Diagnosis not present

## 2019-08-20 DIAGNOSIS — L821 Other seborrheic keratosis: Secondary | ICD-10-CM | POA: Diagnosis not present

## 2019-08-20 DIAGNOSIS — D225 Melanocytic nevi of trunk: Secondary | ICD-10-CM | POA: Diagnosis not present

## 2019-10-01 ENCOUNTER — Telehealth: Payer: Self-pay | Admitting: *Deleted

## 2019-10-01 NOTE — Telephone Encounter (Signed)
Patient called stating that she has chronic fatigue and is just now feeling a little normal. Patient stated that she had the flu back in January 2018 and took the flu shot several weeks later in February. Patient stated that she has had major fatigue now for three years after taking the flu vaccine. Patient stated that she has taken the high dose flu shot for the past three years and is wondering if that may have caused her fatigue. Patient stated that her son told her that fatigue has been associated with the flu vaccine. Patient stated that she is concerned about getting the covid vaccine because of all of the side effects that she is reading about. Patient wants to know with her major fatigue would Dr. Glori Bickers recommend the covid vaccine for her and if so, which would should she take?

## 2019-10-01 NOTE — Telephone Encounter (Signed)
This is of course up to her/ I am unaware of any chronic fatigue assoc with the covid vaccines.   Our recommendation is to get the vaccine that is available to her first.     (that being said- the choice is hers and if she is very anxious about it regardless, I understand)  Of note-I have had people get the covid disease and suffer from chronic fatigue afterwards.  You cannot get covid from the vaccine

## 2019-10-01 NOTE — Telephone Encounter (Signed)
Pt notified of Dr. Marliss Coots comments and verbalized understanding. She will think about getting the vaccine

## 2020-01-02 DIAGNOSIS — L821 Other seborrheic keratosis: Secondary | ICD-10-CM | POA: Diagnosis not present

## 2020-02-11 ENCOUNTER — Encounter: Payer: Self-pay | Admitting: Family Medicine

## 2020-02-11 ENCOUNTER — Ambulatory Visit (INDEPENDENT_AMBULATORY_CARE_PROVIDER_SITE_OTHER): Payer: Medicare HMO | Admitting: Family Medicine

## 2020-02-11 ENCOUNTER — Other Ambulatory Visit: Payer: Self-pay

## 2020-02-11 VITALS — BP 106/68 | HR 75 | Temp 96.9°F | Ht 62.75 in | Wt 113.4 lb

## 2020-02-11 DIAGNOSIS — B49 Unspecified mycosis: Secondary | ICD-10-CM

## 2020-02-11 DIAGNOSIS — R109 Unspecified abdominal pain: Secondary | ICD-10-CM

## 2020-02-11 DIAGNOSIS — N952 Postmenopausal atrophic vaginitis: Secondary | ICD-10-CM

## 2020-02-11 DIAGNOSIS — B379 Candidiasis, unspecified: Secondary | ICD-10-CM | POA: Insufficient documentation

## 2020-02-11 LAB — POCT WET PREP (WET MOUNT): Trichomonas Wet Prep HPF POC: ABSENT

## 2020-02-11 LAB — POC URINALSYSI DIPSTICK (AUTOMATED)
Bilirubin, UA: NEGATIVE
Blood, UA: NEGATIVE
Glucose, UA: NEGATIVE
Ketones, UA: NEGATIVE
Leukocytes, UA: NEGATIVE
Nitrite, UA: NEGATIVE
Protein, UA: NEGATIVE
Spec Grav, UA: 1.015 (ref 1.010–1.025)
Urobilinogen, UA: 0.2 E.U./dL
pH, UA: 6 (ref 5.0–8.0)

## 2020-02-11 MED ORDER — FLUCONAZOLE 150 MG PO TABS: 150.0000 mg | ORAL_TABLET | Freq: Once | ORAL | 0 refills | Status: AC

## 2020-02-11 MED ORDER — ESTRADIOL 0.1 MG/GM VA CREA: TOPICAL_CREAM | VAGINAL | 0 refills | Status: DC

## 2020-02-11 NOTE — Patient Instructions (Addendum)
Take diflucan pill orally once  Hold cholesterol medicine that day   Avoid detergents/soaps in genital area   Use a small pea sized amount of estrace vaginal cream to outer vaginal area/any affected area twice weeks  This helps restore vaginal tissues   If no significant improvement in 2 weeks please let me know

## 2020-02-11 NOTE — Assessment & Plan Note (Signed)
Yeast found on wet prep done for vaginal irritation Will tx with diflucan (holding zocor day of dose) Update if not starting to improve in a week or if worsening    Also plan on tx atrophic vaginitis with est cream

## 2020-02-11 NOTE — Progress Notes (Signed)
Subjective:    Patient ID: Tracy [name], female    DOB: 04/12/1951, 69 y.o.   MRN: [id number]  This visit occurred during the SARS-CoV-2 public health emergency.  Safety protocols were in place, including screening questions prior to the visit, additional usage of staff PPE, and extensive cleaning of exam room while observing appropriate contact time as indicated for disinfecting solutions.    HPI Pt presents for abd/vaginal pain on L side   Wt Readings from Last 3 Encounters:  02/11/20 113 lb 6 oz (51.4 kg)  03/25/19 113 lb 9 oz (51.5 kg)  03/18/19 112 lb (50.8 kg)   20.24 kg/m  She had a hysterectomy in the past -- fibroids and bleeding  Took uterus and one ovary (had a cyst)  Then other ovary failed - was in her 23s -so early menopause  This started last Sunday  Urinated after church and then left  She sat down at home after church- and her perineal area felt painful and raw  She bought vagasil over the counter  No discharge   Still very irritated - whole area both sides   Last week some pressure sensation over low abdomen-and that is better   Has BM every day  The last 2 days -stool has been harder to pass  No blood in stool   No urinary symptoms /no freq or urgency   Results for orders placed or performed in visit on 02/11/20  POCT Urinalysis Dipstick (Automated)  Result Value Ref Range   Color, UA Light Yellow    Clarity, UA Clear    Glucose, UA Negative Negative   Bilirubin, UA Negative    Ketones, UA Negative    Spec Grav, UA 1.015 1.010 - 1.025   Blood, UA Negative    pH, UA 6.0 5.0 - 8.0   Protein, UA Negative Negative   Urobilinogen, UA 0.2 0.2 or 1.0 E.U./dL   Nitrite, UA Negative    Leukocytes, UA Negative Negative  POCT Wet Prep ([facility] [place])  Result Value Ref Range   Source Wet Prep POC vaginal    WBC, Wet Prep HPF POC few    Bacteria Wet Prep HPF POC None (A) Few   BACTERIA WET PREP MORPHOLOGY POC     Clue Cells Wet Prep HPF POC None  None   Clue Cells Wet Prep Whiff POC     Yeast Wet Prep HPF POC Few (A) None   KOH Wet Prep POC Few (A) None   Trichomonas Wet Prep HPF POC Absent Absent     Patient Active Problem List   Diagnosis Date Noted  . Yeast cells and fungal elements present on diagnostic testing 02/11/2020  . Welcome to Medicare preventive visit 03/03/2017  . Estrogen deficiency 03/03/2017  . Borderline low blood pressure determined by examination 10/31/2016  . Vitamin B12 deficiency 10/31/2016  . Chest pain with moderate risk for cardiac etiology 10/17/2014  . Colon cancer screening 02/17/2014  . Other screening mammogram 02/07/2011  . Routine general medical examination at a health care facility 02/01/2011  . Vitamin D deficiency 04/14/2008  . Osteopenia 01/09/2007  . Hyperlipidemia with target LDL less than 130 01/04/2007  . Atrophic vaginitis 01/04/2007  . SCOLIOSIS 01/04/2007  . CHRONIC FATIGUE SYNDROME 01/04/2007   Past Medical History:  Diagnosis Date  . Chronic fatigue syndrome   . Hyperlipidemia   . Osteopenia   . S/P cervical polypectomy    Past Surgical History:  Procedure Laterality Date  .  ABDOMINAL HYSTERECTOMY     one ovary removed/ bleeding  . DILATION AND CURETTAGE OF UTERUS     Social History   Tobacco Use  . Smoking status: Never Smoker  . Smokeless tobacco: Never Used  Vaping Use  . Vaping Use: Never used  Substance Use Topics  . Alcohol use: No    Alcohol/week: 0.0 standard drinks  . Drug use: No   Family History  Problem Relation Age of Onset  . Stroke Mother   . Hypertension Father   . Stroke Father   . Hyperlipidemia Sister   . Hyperlipidemia Brother   . Cancer Paternal Uncle   . Cancer Maternal Grandmother        intestinal CA  . Cancer Paternal Grandfather        CA unsure which kind  . Hyperlipidemia Brother   . Heart attack Sister        Thought to be silent MI  . Breast cancer Neg Hx    Allergies  Allergen Reactions  . Alendronate Sodium      REACTION: GI  . Amoxicillin     REACTION: vomiting  . Ibuprofen     REACTION: rash  . Ivp Dye [Iodinated Diagnostic Agents]    Current Outpatient Medications on File Prior to Visit  Medication Sig Dispense Refill  . Calcium-Vitamin D-Vitamin K (VIACTIV CALCIUM PLUS D) 650-12.5-40 MG-MCG-MCG CHEW Chew 2 tablets by mouth daily.    . Cholecalciferol (VITAMIN D) 2000 UNITS CAPS Take 1 capsule by mouth daily.      . cyanocobalamin 1000 MCG tablet Take 500 mcg by mouth daily.     . simvastatin (ZOCOR) 40 MG tablet TAKE (1) TABLET BY MOUTH DAILY AT BEDTIME 90 tablet 3   No current facility-administered medications on file prior to visit.    Review of Systems  Constitutional: Negative for activity change, appetite change, fatigue, fever and unexpected weight change.  HENT: Negative for congestion, ear pain, rhinorrhea, sinus pressure and sore throat.   Eyes: Negative for pain, redness and visual disturbance.  Respiratory: Negative for cough, shortness of breath and wheezing.   Cardiovascular: Negative for chest pain and palpitations.  Gastrointestinal: Negative for abdominal pain, blood in stool, constipation and diarrhea.  Endocrine: Negative for polydipsia and polyuria.  Genitourinary: Positive for dyspareunia and vaginal pain. Negative for decreased urine volume, dysuria, frequency, urgency, vaginal bleeding and vaginal discharge.  Musculoskeletal: Negative for arthralgias, back pain and myalgias.  Skin: Negative for pallor and rash.  Allergic/Immunologic: Negative for environmental allergies.  Neurological: Negative for dizziness, syncope and headaches.  Hematological: Negative for adenopathy. Does not bruise/bleed easily.  Psychiatric/Behavioral: Negative for decreased concentration and dysphoric mood. The patient is not nervous/anxious.        Objective:   Physical Exam Exam conducted with a chaperone present.  Constitutional:      General: She is not in acute distress.     Appearance: She is well-developed and normal weight. She is not ill-appearing.  Eyes:     General: No scleral icterus. Cardiovascular:     Rate and Rhythm: Normal rate and regular rhythm.     Heart sounds: Normal heart sounds.  Pulmonary:     Effort: Pulmonary effort is normal. No respiratory distress.     Breath sounds: Normal breath sounds.  Abdominal:     General: Abdomen is flat. Bowel sounds are normal. There is no distension.     Palpations: Abdomen is soft. There is no mass.  Tenderness: There is no abdominal tenderness. There is no right CVA tenderness or left CVA tenderness.     Hernia: No hernia is present.  Genitourinary:    Pubic Area: No rash.      Labia:        Right: No rash, tenderness, lesion or injury.        Left: No rash, tenderness, lesion or injury.      Urethra: No prolapse or urethral pain.     Comments: Very small introitus  Unable to use speculum Vaginal mucosa appears atrophic  No plaques or skin changes or lesions No vaginal d/c No M or tenderness on bimanual exam  Musculoskeletal:     Cervical back: Neck supple.  Lymphadenopathy:     Cervical: No cervical adenopathy.  Skin:    General: Skin is warm and dry.     Coloration: Skin is not pale.     Findings: No erythema or rash.  Neurological:     Mental Status: She is alert.  Psychiatric:        Mood and Affect: Mood normal.           Assessment & Plan:   Problem List Items Addressed This Visit      Genitourinary   Atrophic vaginitis    Suspect this adds to symptoms of irritation Px estrace vaginal cream with inst to use pea sized amt twice weekly to outer/inner vaginal area         Other   Yeast cells and fungal elements present on diagnostic testing - Primary    Yeast found on wet prep done for vaginal irritation Will tx with diflucan (holding zocor day of dose) Update if not starting to improve in a week or if worsening    Also plan on tx atrophic vaginitis with est cream       Relevant Orders   POCT Wet Prep [facility]) (Completed)    Other Visit Diagnoses    Flank pain       Relevant Orders   POCT Urinalysis Dipstick (Automated) (Completed)

## 2020-02-11 NOTE — Assessment & Plan Note (Signed)
Suspect this adds to symptoms of irritation Px estrace vaginal cream with inst to use pea sized amt twice weekly to outer/inner vaginal area

## 2020-02-27 ENCOUNTER — Encounter: Payer: Self-pay | Admitting: Family Medicine

## 2020-02-27 DIAGNOSIS — R1032 Left lower quadrant pain: Secondary | ICD-10-CM

## 2020-03-04 DIAGNOSIS — R1032 Left lower quadrant pain: Secondary | ICD-10-CM | POA: Insufficient documentation

## 2020-03-04 NOTE — Telephone Encounter (Signed)
This is in the WQ, and will be handled first thing 8/12.

## 2020-03-04 NOTE — Telephone Encounter (Signed)
I ordered the CT She has past contrast allergy (iodine)  so I ordered w/o contrast  Let me know if the radiologist thinks I need to change that   Will send to [facility]   I also ordered labs when she can come for cbc and bmet Thanks   Please let her know we are working on it

## 2020-03-04 NOTE — Telephone Encounter (Signed)
Pt called to get status of CT scan appt. Pt said when she stands she can be pain free but if she bends over or sits to urinate pt has lt lower dull abd pain that continues for awhile once the pain begins. Pt said feels like pressure in lower abdomen like pt has a tight seatbelt across lower abd. Pt said the pain comes and goes and when has pain the pain level is a 6. Pt is not having any fever. Pt also said that she found out that her ins requires prior approval for CT scans. Pt request cb. Sending to Dr Percell Miller CMA and I gave heads up to [place] and [place] about PA for CT.

## 2020-03-04 NOTE — Telephone Encounter (Signed)
Pt notified CT ordered and advised of Dr. Marliss Coots comments. Lab appt scheduled and she will await [facility]'s phone call

## 2020-03-05 ENCOUNTER — Other Ambulatory Visit: Payer: Self-pay

## 2020-03-05 ENCOUNTER — Other Ambulatory Visit (INDEPENDENT_AMBULATORY_CARE_PROVIDER_SITE_OTHER): Payer: Medicare HMO

## 2020-03-05 DIAGNOSIS — R1032 Left lower quadrant pain: Secondary | ICD-10-CM | POA: Diagnosis not present

## 2020-03-05 LAB — BASIC METABOLIC PANEL
BUN: 10 mg/dL (ref 6–23)
CO2: 33 mEq/L — ABNORMAL HIGH (ref 19–32)
Calcium: 9.4 mg/dL (ref 8.4–10.5)
Chloride: 105 mEq/L (ref 96–112)
Creatinine, Ser: 0.7 mg/dL (ref 0.40–1.20)
GFR: 83.14 mL/min (ref >60.00)
Glucose, Bld: 100 mg/dL — ABNORMAL HIGH (ref 70–99)
Potassium: 3.7 mEq/L (ref 3.5–5.1)
Sodium: 143 mEq/L (ref 135–145)

## 2020-03-05 LAB — CBC WITH DIFFERENTIAL/PLATELET
Basophils Absolute: 0.1 10*3/uL (ref 0.0–0.1)
Basophils Relative: 1.1 % (ref 0.0–3.0)
Eosinophils Absolute: 0.1 10*3/uL (ref 0.0–0.7)
Eosinophils Relative: 1.3 % (ref 0.0–5.0)
HCT: 42.2 % (ref 36.0–46.0)
Hemoglobin: 13.8 g/dL (ref 12.0–15.0)
Lymphocytes Relative: 40.8 % (ref 12.0–46.0)
Lymphs Abs: 2.4 10*3/uL (ref 0.7–4.0)
MCHC: 32.6 g/dL (ref 30.0–36.0)
MCV: 94.8 fl (ref 78.0–100.0)
Monocytes Absolute: 0.5 10*3/uL (ref 0.1–1.0)
Monocytes Relative: 8.7 % (ref 3.0–12.0)
Neutro Abs: 2.9 10*3/uL (ref 1.4–7.7)
Neutrophils Relative %: 48.1 % (ref 43.0–77.0)
Platelets: 192 10*3/uL (ref 150.0–400.0)
RBC: 4.46 Mil/uL (ref 3.87–5.11)
RDW: 14.1 % (ref 11.5–15.5)
WBC: 5.9 10*3/uL (ref 4.0–10.5)

## 2020-03-11 ENCOUNTER — Ambulatory Visit
Admission: RE | Admit: 2020-03-11 | Discharge: 2020-03-11 | Disposition: A | Discharge status: Home / Self Care | Payer: Medicare HMO | Source: Ambulatory Visit | Attending: Family Medicine | Admitting: Family Medicine

## 2020-03-11 ENCOUNTER — Encounter: Payer: Self-pay | Admitting: Family Medicine

## 2020-03-11 ENCOUNTER — Other Ambulatory Visit: Payer: Self-pay

## 2020-03-11 DIAGNOSIS — D1771 Benign lipomatous neoplasm of kidney: Secondary | ICD-10-CM | POA: Insufficient documentation

## 2020-03-11 DIAGNOSIS — D3002 Benign neoplasm of left kidney: Secondary | ICD-10-CM | POA: Diagnosis not present

## 2020-03-11 DIAGNOSIS — I708 Atherosclerosis of other arteries: Secondary | ICD-10-CM | POA: Diagnosis not present

## 2020-03-11 DIAGNOSIS — N2889 Other specified disorders of kidney and ureter: Secondary | ICD-10-CM | POA: Diagnosis not present

## 2020-03-11 DIAGNOSIS — R1032 Left lower quadrant pain: Secondary | ICD-10-CM | POA: Diagnosis not present

## 2020-03-17 ENCOUNTER — Encounter: Payer: Self-pay | Admitting: Family Medicine

## 2020-03-17 ENCOUNTER — Ambulatory Visit (INDEPENDENT_AMBULATORY_CARE_PROVIDER_SITE_OTHER)
Admission: RE | Admit: 2020-03-17 | Discharge: 2020-03-17 | Disposition: A | Discharge status: Home / Self Care | Payer: Medicare HMO | Source: Ambulatory Visit | Attending: Family Medicine | Admitting: Family Medicine

## 2020-03-17 ENCOUNTER — Other Ambulatory Visit: Payer: Self-pay

## 2020-03-17 ENCOUNTER — Ambulatory Visit (INDEPENDENT_AMBULATORY_CARE_PROVIDER_SITE_OTHER): Payer: Medicare HMO | Admitting: Family Medicine

## 2020-03-17 VITALS — BP 136/72 | HR 68 | Temp 97.2°F | Ht 62.75 in | Wt 115.0 lb

## 2020-03-17 DIAGNOSIS — B379 Candidiasis, unspecified: Secondary | ICD-10-CM | POA: Diagnosis not present

## 2020-03-17 DIAGNOSIS — B49 Unspecified mycosis: Secondary | ICD-10-CM

## 2020-03-17 DIAGNOSIS — M160 Bilateral primary osteoarthritis of hip: Secondary | ICD-10-CM | POA: Diagnosis not present

## 2020-03-17 DIAGNOSIS — D1771 Benign lipomatous neoplasm of kidney: Secondary | ICD-10-CM | POA: Diagnosis not present

## 2020-03-17 DIAGNOSIS — R1031 Right lower quadrant pain: Secondary | ICD-10-CM

## 2020-03-17 DIAGNOSIS — R1032 Left lower quadrant pain: Secondary | ICD-10-CM

## 2020-03-17 DIAGNOSIS — N952 Postmenopausal atrophic vaginitis: Secondary | ICD-10-CM

## 2020-03-17 MED ORDER — FLUCONAZOLE 150 MG PO TABS
150.0000 mg | ORAL_TABLET | Freq: Once | ORAL | 0 refills | Status: AC
Start: 2020-03-17 — End: 2020-03-17

## 2020-03-17 NOTE — Assessment & Plan Note (Signed)
Pt had relief with tx of diflucan and the burning returned today  Send in one more diflucan to take Will update- curious to see if this will help again

## 2020-03-17 NOTE — Assessment & Plan Note (Signed)
No tenderness on exam  Reassuring CT scan  Some pain with hip rom - will xray hips today

## 2020-03-17 NOTE — Assessment & Plan Note (Signed)
Pt started using the estrace cream

## 2020-03-17 NOTE — Assessment & Plan Note (Signed)
Incidental finding on CT scan  Small  Recommendation is re imaging with renal US in 3-4 y

## 2020-03-17 NOTE — Patient Instructions (Addendum)
Take diflucan for vulvar burning   Let's check xrays to see if you have hip arthritis (a common cause of pelvic pain)   The prolapsed bladder will more likely cause urinary changes than pain   Let's get results and consider specialist referral if needed

## 2020-03-17 NOTE — Assessment & Plan Note (Signed)
This refers to low abdomen  Reviewed reassuring labs and CT scan  No tenderness at all on exam  Some reproduction of pain with hip flexion and L hip internal rotation (putting OA of hips in the differential)  She has known bladder prolapse= would suspect more urinary symptoms than pain with this  Xray bilat hips now  Plan to follow - if negative may consider gyn or urology ref

## 2020-03-17 NOTE — Progress Notes (Signed)
Subjective:    Patient ID: Tracy [name], female    DOB: 1951/02/06, 69 y.o.   MRN: [id number]  This visit occurred during the SARS-CoV-2 public health emergency.  Safety protocols were in place, including screening questions prior to the visit, additional usage of staff PPE, and extensive cleaning of exam room while observing appropriate contact time as indicated for disinfecting solutions.    HPI Pt presents for re check of bladder prolapse and low abd pain   Was last seen for painful/raw perineal area and low abdominal pressure  She has had a hysterectomy  ua was clear and wet prep pos for yeast  Also noted atrophic vag changes  Px diflucan for yeast and estrace cream for atrophy   She called back stating vaginal symptoms were better but low abd pain and pressure were worsening  Pain worsened with bending over or sitting to urinate  We ordered a CT scan  CT Abdomen Pelvis Wo Contrast  Result Date: 03/11/2020 CLINICAL DATA:  Abdominal pain, LEFT lower quadrant and pelvic pain for 1 month, fever EXAM: CT ABDOMEN AND PELVIS WITHOUT CONTRAST TECHNIQUE: Multidetector CT imaging of the abdomen and pelvis was performed following the standard protocol without IV contrast. Sagittal and coronal MPR images reconstructed from axial data set. COMPARISON:  10/13/15 FINDINGS: Lower chest: Lung bases clear Hepatobiliary: Gallbladder and liver normal appearance Pancreas: Normal appearance Spleen: Normal appearance Adrenals/Urinary Tract: 4 mm angiomyolipoma at upper pole LEFT kidney. Adrenal glands, kidneys, and ureters otherwise normal appearance. Low descent of bladder in pelvis question cystocele. Stomach/Bowel: Appendix normal appearance. Stomach and bowel loops normal appearance. Vascular/Lymphatic: Minimal scattered atherosclerotic calcification RIGHT common iliac artery. Reproductive: Uterus surgically absent with nonvisualization of ovaries. Other: No free air or free fluid.  No hernia.  Musculoskeletal: Mild dextroconvex scoliosis. Osseous demineralization. IMPRESSION: Probable cystocele. 4 mm angiomyolipoma at upper pole LEFT kidney. No acute intra-abdominal or intrapelvic abnormalities. Electronically Signed   By: Lavonia Dana M.D.   On: 03/11/2020 10:23    Angiolipoma of kidney is new/incidental  Small in size and will require [place] in 3-4 y to re check    Last labs Lab Results  Component Value Date   WBC 5.9 03/05/2020   HGB 13.8 03/05/2020   HCT 42.2 03/05/2020   MCV 94.8 03/05/2020   PLT 192.0 03/05/2020   Lab Results  Component Value Date   CREATININE 0.70 03/05/2020   BUN 10 03/05/2020   NA 143 03/05/2020   K 3.7 03/05/2020   CL 105 03/05/2020   CO2 33 (H) 03/05/2020   Lab Results  Component Value Date   ALT 21 03/21/2019   AST 24 03/21/2019   ALKPHOS 39 03/21/2019   BILITOT 0.7 03/21/2019   glucose was 100   Woke up this am -side and pelvis hurting (L)  Worse to move her leg/hip    Forgot to tell us she slipped and almost fell getting in the car the day before this all started- she fell into side of car and bruised her arm  No pelvic pain at this time   She notes she does feel rounded bladder prolapsing when she urinates  Patient Active Problem List   Diagnosis Date Noted  . Bilateral groin pain 03/17/2020  . Angiomyolipoma of left kidney 03/11/2020  . Left lower quadrant abdominal pain 03/04/2020  . Yeast cells and fungal elements present on diagnostic testing 02/11/2020  . Welcome to Medicare preventive visit 03/03/2017  . Estrogen deficiency 03/03/2017  .  Borderline low blood pressure determined by examination 10/31/2016  . Vitamin B12 deficiency 10/31/2016  . Chest pain with moderate risk for cardiac etiology 10/17/2014  . Colon cancer screening 02/17/2014  . Other screening mammogram 02/07/2011  . Routine general medical examination at a health care facility 02/01/2011  . Vitamin D deficiency 04/14/2008  . Osteopenia 01/09/2007  .  Hyperlipidemia with target LDL less than 130 01/04/2007  . Atrophic vaginitis 01/04/2007  . SCOLIOSIS 01/04/2007  . CHRONIC FATIGUE SYNDROME 01/04/2007   Past Medical History:  Diagnosis Date  . Chronic fatigue syndrome   . Hyperlipidemia   . Osteopenia   . S/P cervical polypectomy    Past Surgical History:  Procedure Laterality Date  . ABDOMINAL HYSTERECTOMY     one ovary removed/ bleeding  . DILATION AND CURETTAGE OF UTERUS     Social History   Tobacco Use  . Smoking status: Never Smoker  . Smokeless tobacco: Never Used  Vaping Use  . Vaping Use: Never used  Substance Use Topics  . Alcohol use: No    Alcohol/week: 0.0 standard drinks  . Drug use: No   Family History  Problem Relation Age of Onset  . Stroke Mother   . Hypertension Father   . Stroke Father   . Hyperlipidemia Sister   . Hyperlipidemia Brother   . Cancer Paternal Uncle   . Cancer Maternal Grandmother        intestinal CA  . Cancer Paternal Grandfather        CA unsure which kind  . Hyperlipidemia Brother   . Heart attack Sister        Thought to be silent MI  . Breast cancer Neg Hx    Allergies  Allergen Reactions  . Alendronate Sodium     REACTION: GI  . Amoxicillin     REACTION: vomiting  . Ibuprofen     REACTION: rash  . Ivp Dye [Iodinated Diagnostic Agents]    Current Outpatient Medications on File Prior to Visit  Medication Sig Dispense Refill  . Calcium-Vitamin D-Vitamin K (VIACTIV CALCIUM PLUS D) 650-12.5-40 MG-MCG-MCG CHEW Chew 2 tablets by mouth daily.    . Cholecalciferol (VITAMIN D) 2000 UNITS CAPS Take 1 capsule by mouth daily.      . cyanocobalamin 1000 MCG tablet Take 500 mcg by mouth daily.     Marland Kitchen estradiol (ESTRACE VAGINAL) 0.1 MG/GM vaginal cream Use pea sized amount of cream to vaginal area twice weekly 42.5 g 0  . simvastatin (ZOCOR) 40 MG tablet TAKE (1) TABLET BY MOUTH DAILY AT BEDTIME 90 tablet 3   No current facility-administered medications on file prior to visit.     Review of Systems  Constitutional: Negative for activity change, appetite change, fatigue, fever and unexpected weight change.  HENT: Negative for congestion, ear pain, rhinorrhea, sinus pressure and sore throat.   Eyes: Negative for pain, redness and visual disturbance.  Respiratory: Negative for cough, shortness of breath and wheezing.   Cardiovascular: Negative for chest pain and palpitations.  Gastrointestinal: Negative for abdominal pain, blood in stool, constipation and diarrhea.  Endocrine: Negative for polydipsia and polyuria.  Genitourinary: Positive for frequency and pelvic pain. Negative for decreased urine volume, dysuria, flank pain, hematuria, urgency, vaginal bleeding and vaginal discharge.       Vaginal itch/burn again  No rashes that she has noted  Musculoskeletal: Negative for arthralgias, back pain and myalgias.       Some groin pain with position change/movement of her hips  Skin: Negative for pallor and rash.  Allergic/Immunologic: Negative for environmental allergies.  Neurological: Negative for dizziness, syncope and headaches.  Hematological: Negative for adenopathy. Does not bruise/bleed easily.  Psychiatric/Behavioral: Negative for decreased concentration and dysphoric mood. The patient is nervous/anxious.        Objective:   Physical Exam Constitutional:      General: She is not in acute distress.    Appearance: Normal appearance. She is well-developed and normal weight. She is not ill-appearing or diaphoretic.  HENT:     Head: Normocephalic and atraumatic.  Eyes:     General: No scleral icterus.    Conjunctiva/sclera: Conjunctivae normal.     Pupils: Pupils are equal, round, and reactive to light.  Neck:     Thyroid: No thyromegaly.     Vascular: No carotid bruit or JVD.  Cardiovascular:     Rate and Rhythm: Normal rate and regular rhythm.     Heart sounds: Normal heart sounds. No gallop.   Pulmonary:     Effort: Pulmonary effort is normal. No  respiratory distress.     Breath sounds: Normal breath sounds. No wheezing or rales.  Abdominal:     General: Bowel sounds are normal. There is no distension or abdominal bruit.     Palpations: Abdomen is soft. There is no mass.     Tenderness: There is no abdominal tenderness. There is no right CVA tenderness, left CVA tenderness, guarding or rebound.     Hernia: No hernia is present.  Musculoskeletal:     Cervical back: Normal range of motion and neck supple.     Right lower leg: No edema.     Left lower leg: No edema.     Comments: Pain in groin and suprapubic area with flexion of both hips and worse with internal rotation of L hip  No trochanteric tenderness No crepitus    Lymphadenopathy:     Cervical: No cervical adenopathy.  Skin:    General: Skin is warm and dry.     Coloration: Skin is not jaundiced or pale.     Findings: No bruising, erythema or rash.  Neurological:     Mental Status: She is alert.     Coordination: Coordination normal.     Gait: Gait normal.     Deep Tendon Reflexes: Reflexes are normal and symmetric. Reflexes normal.  Psychiatric:        Mood and Affect: Mood is anxious.        Cognition and Memory: Cognition and memory normal.           Assessment & Plan:   Problem List Items Addressed This Visit      Genitourinary   Atrophic vaginitis    Pt started using the estrace cream       Angiomyolipoma of left kidney    Incidental finding on CT scan  Small  Recommendation is re imaging with renal US in 3-4 y        Other   Yeast cells and fungal elements present on diagnostic testing    Pt had relief with tx of diflucan and the burning returned today  Send in one more diflucan to take Will update- curious to see if this will help again       Left lower quadrant abdominal pain    No tenderness on exam  Reassuring CT scan  Some pain with hip rom - will xray hips today      Bilateral groin pain - Primary  This refers to low abdomen   Reviewed reassuring labs and CT scan  No tenderness at all on exam  Some reproduction of pain with hip flexion and L hip internal rotation (putting OA of hips in the differential)  She has known bladder prolapse= would suspect more urinary symptoms than pain with this  Xray bilat hips now  Plan to follow - if negative may consider gyn or urology ref      Relevant Orders   DG HIPS BILAT W OR W/O PELVIS 3-4 VIEWS

## 2020-03-18 ENCOUNTER — Telehealth: Payer: Self-pay | Admitting: Family Medicine

## 2020-03-18 DIAGNOSIS — R1031 Right lower quadrant pain: Secondary | ICD-10-CM

## 2020-03-18 NOTE — Telephone Encounter (Signed)
Referral done Will route to [facility]  

## 2020-03-18 NOTE — Telephone Encounter (Signed)
-----   Message from Tracy [name], [place] sent at 03/18/2020  1:45 PM EDT ----- Pt notified of xray results and Dr. Marliss Coots comments. Pt agrees with Ortho referral. She would like to see someone in [place] if possible. I advise pt PCP will put referral in and our [facility] will call to schedule

## 2020-03-19 NOTE — Telephone Encounter (Signed)
noted 

## 2020-03-23 ENCOUNTER — Ambulatory Visit: Payer: Medicare HMO

## 2020-03-23 ENCOUNTER — Telehealth: Payer: Self-pay | Admitting: Family Medicine

## 2020-03-23 DIAGNOSIS — Z0000 Encounter for general adult medical examination without abnormal findings: Secondary | ICD-10-CM

## 2020-03-23 DIAGNOSIS — E785 Hyperlipidemia, unspecified: Secondary | ICD-10-CM

## 2020-03-23 DIAGNOSIS — E559 Vitamin D deficiency, unspecified: Secondary | ICD-10-CM

## 2020-03-23 DIAGNOSIS — G9332 Myalgic encephalomyelitis/chronic fatigue syndrome: Secondary | ICD-10-CM

## 2020-03-23 DIAGNOSIS — E538 Deficiency of other specified B group vitamins: Secondary | ICD-10-CM

## 2020-03-23 NOTE — Telephone Encounter (Signed)
-----   Message from Ellamae Sia sent at 03/09/2020 10:33 AM EDT ----- Regarding: Lab orders for Tuesday, 8.31.21 Patient is scheduled for CPX labs, please order future labs, Thanks , Karna Christmas

## 2020-03-24 ENCOUNTER — Other Ambulatory Visit (INDEPENDENT_AMBULATORY_CARE_PROVIDER_SITE_OTHER): Payer: Medicare HMO

## 2020-03-24 ENCOUNTER — Other Ambulatory Visit: Payer: Self-pay

## 2020-03-24 ENCOUNTER — Ambulatory Visit (INDEPENDENT_AMBULATORY_CARE_PROVIDER_SITE_OTHER): Payer: Medicare HMO

## 2020-03-24 DIAGNOSIS — R5382 Chronic fatigue, unspecified: Secondary | ICD-10-CM

## 2020-03-24 DIAGNOSIS — Z0000 Encounter for general adult medical examination without abnormal findings: Secondary | ICD-10-CM

## 2020-03-24 DIAGNOSIS — E559 Vitamin D deficiency, unspecified: Secondary | ICD-10-CM | POA: Diagnosis not present

## 2020-03-24 DIAGNOSIS — G9332 Myalgic encephalomyelitis/chronic fatigue syndrome: Secondary | ICD-10-CM

## 2020-03-24 DIAGNOSIS — E785 Hyperlipidemia, unspecified: Secondary | ICD-10-CM | POA: Diagnosis not present

## 2020-03-24 DIAGNOSIS — E538 Deficiency of other specified B group vitamins: Secondary | ICD-10-CM

## 2020-03-24 LAB — LIPID PANEL
Cholesterol: 179 mg/dL (ref 0–200)
HDL: 71.3 mg/dL (ref >39.00)
LDL Cholesterol: 89 mg/dL (ref 0–99)
NonHDL: 108.16
Total CHOL/HDL Ratio: 3
Triglycerides: 96 mg/dL (ref 0.0–149.0)
VLDL: 19.2 mg/dL (ref 0.0–40.0)

## 2020-03-24 LAB — CBC WITH DIFFERENTIAL/PLATELET
Basophils Absolute: 0.1 10*3/uL (ref 0.0–0.1)
Basophils Relative: 1.1 % (ref 0.0–3.0)
Eosinophils Absolute: 0.1 10*3/uL (ref 0.0–0.7)
Eosinophils Relative: 1.6 % (ref 0.0–5.0)
HCT: 41.5 % (ref 36.0–46.0)
Hemoglobin: 13.9 g/dL (ref 12.0–15.0)
Lymphocytes Relative: 44.5 % (ref 12.0–46.0)
Lymphs Abs: 2.5 10*3/uL (ref 0.7–4.0)
MCHC: 33.5 g/dL (ref 30.0–36.0)
MCV: 93.9 fl (ref 78.0–100.0)
Monocytes Absolute: 0.5 10*3/uL (ref 0.1–1.0)
Monocytes Relative: 8.6 % (ref 3.0–12.0)
Neutro Abs: 2.5 10*3/uL (ref 1.4–7.7)
Neutrophils Relative %: 44.2 % (ref 43.0–77.0)
Platelets: 176 10*3/uL (ref 150.0–400.0)
RBC: 4.42 Mil/uL (ref 3.87–5.11)
RDW: 13.9 % (ref 11.5–15.5)
WBC: 5.6 10*3/uL (ref 4.0–10.5)

## 2020-03-24 LAB — COMPREHENSIVE METABOLIC PANEL
ALT: 16 U/L (ref 0–35)
AST: 21 U/L (ref 0–37)
Albumin: 4.3 g/dL (ref 3.5–5.2)
Alkaline Phosphatase: 45 U/L (ref 39–117)
BUN: 9 mg/dL (ref 6–23)
CO2: 30 mEq/L (ref 19–32)
Calcium: 9.2 mg/dL (ref 8.4–10.5)
Chloride: 106 mEq/L (ref 96–112)
Creatinine, Ser: 0.66 mg/dL (ref 0.40–1.20)
GFR: 88.97 mL/min (ref >60.00)
Glucose, Bld: 78 mg/dL (ref 70–99)
Potassium: 4 mEq/L (ref 3.5–5.1)
Sodium: 142 mEq/L (ref 135–145)
Total Bilirubin: 0.9 mg/dL (ref 0.2–1.2)
Total Protein: 6.2 g/dL (ref 6.0–8.3)

## 2020-03-24 LAB — TSH: TSH: 2.08 u[IU]/mL (ref 0.35–4.50)

## 2020-03-24 LAB — VITAMIN D 25 HYDROXY (VIT D DEFICIENCY, FRACTURES): VITD: 41.55 ng/mL (ref 30.00–100.00)

## 2020-03-24 LAB — VITAMIN B12: Vitamin B-12: 762 pg/mL (ref 211–911)

## 2020-03-24 NOTE — Progress Notes (Signed)
Subjective:   Tracy [name] is a 69 y.o. female who presents for Medicare Annual (Subsequent) preventive examination.  Review of Systems: N/A     I connected with the patient today by telephone and verified that I am speaking with the correct person using two identifiers. Location patient: home Location nurse: work Persons participating in the telephone visit: patient, nurse.   I discussed the limitations, risks, security and privacy concerns of performing an evaluation and management service by telephone and the availability of in person appointments. I also discussed with the patient that there may be a patient responsible charge related to this service. The patient expressed understanding and verbally consented to this telephonic visit.        Cardiac Risk Factors include: advanced age (>41men, >17 women);dyslipidemia     Objective:    Today's Vitals   03/24/20 1200  PainSc: 5    There is no height or weight on file to calculate BMI.  Advanced Directives 03/24/2020 03/18/2019 03/08/2018  Does Patient Have a Medical Advance Directive? No No No  Does patient want to make changes to medical advance directive? Yes (MAU/Ambulatory/Procedural Areas - Information given) - -  Would patient like information on creating a medical advance directive? - - No - Patient declined    Current Medications (verified) Outpatient Encounter Medications as of 03/24/2020  Medication Sig  . Calcium-Vitamin D-Vitamin K (VIACTIV CALCIUM PLUS D) 650-12.5-40 MG-MCG-MCG CHEW Chew 2 tablets by mouth daily.  . Cholecalciferol (VITAMIN D) 2000 UNITS CAPS Take 1 capsule by mouth daily.    . cyanocobalamin 1000 MCG tablet Take 500 mcg by mouth daily.   Marland Kitchen estradiol (ESTRACE VAGINAL) 0.1 MG/GM vaginal cream Use pea sized amount of cream to vaginal area twice weekly  . simvastatin (ZOCOR) 40 MG tablet TAKE (1) TABLET BY MOUTH DAILY AT BEDTIME   No facility-administered encounter medications on file as of  03/24/2020.    Allergies (verified) Alendronate sodium, Amoxicillin, Ibuprofen, and Ivp dye [iodinated diagnostic agents]   History: Past Medical History:  Diagnosis Date  . Chronic fatigue syndrome   . Hyperlipidemia   . Osteopenia   . S/P cervical polypectomy    Past Surgical History:  Procedure Laterality Date  . ABDOMINAL HYSTERECTOMY     one ovary removed/ bleeding  . DILATION AND CURETTAGE OF UTERUS     Family History  Problem Relation Age of Onset  . Stroke Mother   . Hypertension Father   . Stroke Father   . Hyperlipidemia Sister   . Hyperlipidemia Brother   . Cancer Paternal Uncle   . Cancer Maternal Grandmother        intestinal CA  . Cancer Paternal Grandfather        CA unsure which kind  . Hyperlipidemia Brother   . Heart attack Sister        Thought to be silent MI  . Breast cancer Neg Hx    Social History   Socioeconomic History  . Marital status: Married    Spouse name: Not on file  . Number of children: Not on file  . Years of education: Not on file  . Highest education level: Not on file  Occupational History  . Not on file  Tobacco Use  . Smoking status: Never Smoker  . Smokeless tobacco: Never Used  Vaping Use  . Vaping Use: Never used  Substance and Sexual Activity  . Alcohol use: No    Alcohol/week: 0.0 standard drinks  . Drug  use: No  . Sexual activity: Yes    Partners: Female  Other Topics Concern  . Not on file  Social History Narrative  . Not on file   Social Determinants of Health   Financial Resource Strain: Low Risk   . Difficulty of Paying Living Expenses: Not hard at all  Food Insecurity: No Food Insecurity  . Worried About Charity fundraiser in the Last Year: Never true  . Ran Out of Food in the Last Year: Never true  Transportation Needs: No Transportation Needs  . Lack of Transportation (Medical): No  . Lack of Transportation (Non-Medical): No  Physical Activity: Inactive  . Days of Exercise per Week: 0 days    . Minutes of Exercise per Session: 0 min  Stress: No Stress Concern Present  . Feeling of Stress : Not at all  Social Connections:   . Frequency of Communication with Friends and Family: Not on file  . Frequency of Social Gatherings with Friends and Family: Not on file  . Attends Religious Services: Not on file  . Active Member of Clubs or Organizations: Not on file  . Attends Archivist Meetings: Not on file  . Marital Status: Not on file    Tobacco Counseling Counseling given: Not Answered   Clinical Intake:  Pre-visit preparation completed: Yes  Pain : 0-10 Pain Score: 5  Pain Type: Chronic pain Pain Location: Hip Pain Orientation: Left Pain Descriptors / Indicators: Aching Pain Onset: More than a month ago Pain Frequency: Intermittent     Nutritional Risks: None Diabetes: No  How often do you need to have someone help you when you read instructions, pamphlets, or other written materials from your doctor or pharmacy?: 1 - Never What is the last grade level you completed in school?: some college  Diabetic: No Nutrition Risk Assessment:  Has the patient had any N/V/D within the last 2 months?  No  Does the patient have any non-healing wounds?  No  Has the patient had any unintentional weight loss or weight gain?  No   Diabetes:  Is the patient diabetic?  No  If diabetic, was a CBG obtained today?  N/A Did the patient bring in their glucometer from home?  N/A How often do you monitor your CBG's? N/A.   Financial Strains and Diabetes Management:  Are you having any financial strains with the device, your supplies or your medication? N/A.  Does the patient want to be seen by Chronic Care Management for management of their diabetes?  N/A Would the patient like to be referred to a Nutritionist or for Diabetic Management?  N/A    Interpreter Needed?: No  Information entered by :: CJohnson, LPN   Activities of Daily Living In your present state  of health, do you have any difficulty performing the following activities: 03/24/2020  Hearing? N  Vision? N  Difficulty concentrating or making decisions? N  Walking or climbing stairs? N  Dressing or bathing? N  Doing errands, shopping? N  Preparing Food and eating ? N  Using the Toilet? N  In the past six months, have you accidently leaked urine? N  Do you have problems with loss of bowel control? N  Managing your Medications? N  Managing your Finances? N  Housekeeping or managing your Housekeeping? N  Some recent data might be hidden    Patient Care Team: Tower, Wynelle Fanny, MD as PCP - General  Indicate any recent Medical Services you may have received  from other than [facility] providers in the past year (date may be approximate).     Assessment:   This is a routine wellness examination for Emer.  Hearing/Vision screen  Hearing Screening   125Hz  250Hz  500Hz  1000Hz  2000Hz  3000Hz  4000Hz  6000Hz  8000Hz   Right ear:           Left ear:           Vision Screening Comments: Patient gets annual eye exams.  Dietary issues and exercise activities discussed: Current Exercise Habits: The patient does not participate in regular exercise at present, Exercise limited by: None identified  Goals    . Patient Stated     Starting 03/08/2018, I will continue to take medications as prescribed.     . Patient Stated     03/18/2019, wants to get in to better shape    . Patient Stated     03/24/2020, I will maintain and continue medications as prescribed.       Depression Screen PHQ 2/9 Scores 03/24/2020 03/18/2019 03/08/2018 03/03/2017 02/17/2014  PHQ - 2 Score 0 0 0 0 0  PHQ- 9 Score 0 0 0 4 -    Fall Risk Fall Risk  03/24/2020 03/18/2019 03/08/2018 03/03/2017 02/17/2014  Falls in the past year? 1 0 No No No  Comment tripped down stairs - - - -  Number falls in past yr: 0 - - - -  Injury with Fall? 0 - - - -  Risk for fall due to : No Fall Risks - - - -  Follow up Falls evaluation completed;Falls  prevention discussed Falls evaluation completed;Falls prevention discussed - - -    Any stairs in or around the home? Yes  If so, are there any without handrails? No  Home free of loose throw rugs in walkways, pet beds, electrical cords, etc? Yes  Adequate lighting in your home to reduce risk of falls? Yes   ASSISTIVE DEVICES UTILIZED TO PREVENT FALLS:  Life alert? No  Use of a cane, walker or w/c? No  Grab bars in the bathroom? No  Shower chair or bench in shower? No  Elevated toilet seat or a handicapped toilet? No   TIMED UP AND GO:  Was the test performed? N/A, telephonic visit .    Cognitive Function: MMSE - Mini Mental State Exam 03/24/2020 03/18/2019 03/08/2018  Orientation to time 5 5 5   Orientation to Place 5 5 5   Registration 3 3 3   Attention/ Calculation 5 5 0  Recall 3 3 3   Language- name 2 objects - 0 0  Language- repeat 1 0 1  Language- follow 3 step command - 0 3  Language- read & follow direction - 0 0  Write a sentence - 0 0  Copy design - 0 0  Total score - 21 20  Mini Cog  Mini-Cog screen was completed. Maximum score is 22. A value of 0 denotes this part of the MMSE was not completed or the patient failed this part of the Mini-Cog screening.       Immunizations Immunization History  Administered Date(s) Administered  . Fluad Quad(high Dose 65+) 03/25/2019  . Influenza, High Dose Seasonal PF 03/30/2017  . Influenza,inj,Quad PF,6+ Mos 09/08/2016, 04/26/2018  . Influenza-Unspecified 04/24/2013, 05/05/2014, 05/04/2015  . PFIZER SARS-COV-2 Vaccination 10/08/2019, 10/29/2019  . Pneumococcal Conjugate-13 03/03/2017  . Pneumococcal Polysaccharide-23 03/08/2018  . Td 07/25/1996, 01/09/2007, 03/30/2017  . Zoster 02/14/2012    TDAP status: Up to date Flu Vaccine status: due, will  talk with provider before receiving this vaccine  Pneumococcal vaccine status: Up to date Covid-19 vaccine status: Completed vaccines  Qualifies for Shingles Vaccine? Yes     Zostavax completed Yes   Shingrix Completed?: No.    Education has been provided regarding the importance of this vaccine. Patient has been advised to call insurance company to determine out of pocket expense if they have not yet received this vaccine. Advised may also receive vaccine at local pharmacy or Health Dept. Verbalized acceptance and understanding.  Screening Tests Health Maintenance  Topic Date Due  . INFLUENZA VACCINE  02/23/2020  . MAMMOGRAM  04/28/2020  . COLONOSCOPY  04/11/2024  . TETANUS/TDAP  03/31/2027  . DEXA SCAN  Completed  . COVID-19 Vaccine  Completed  . Hepatitis C Screening  Completed  . PNA vac Low Risk Adult  Completed    Health Maintenance  Health Maintenance Due  Topic Date Due  . INFLUENZA VACCINE  02/23/2020    Colorectal cancer screening: Completed 04/11/2014. Repeat every 10 years Mammogram status: Completed 04/29/2019. Repeat every year Bone Density status: due, will discuss with provider about doing this along with her mammogram this year.   Lung Cancer Screening: (Low Dose CT Chest recommended if Age 5-80 years, 30 pack-year currently smoking OR have quit w/in 15 years.) does not qualify.    Additional Screening:  Hepatitis C Screening: does qualify; Completed 03/08/2018  Vision Screening: Recommended annual ophthalmology exams for early detection of glaucoma and other disorders of the eye. Is the patient up to date with their annual eye exam?  Yes  Who is the provider or what is the name of the office in which the patient attends annual eye exams? Dr. Agapito Games, [facility]  If pt is not established with a provider, would they like to be referred to a provider to establish care? No .   Dental Screening: Recommended annual dental exams for proper oral hygiene  Community Resource Referral / Chronic Care Management: CRR required this visit?  No   CCM required this visit?  No      Plan:     I have personally reviewed and  noted the following in the patient's chart:   . Medical and social history . Use of alcohol, tobacco or illicit drugs  . Current medications and supplements . Functional ability and status . Nutritional status . Physical activity . Advanced directives . List of other physicians . Hospitalizations, surgeries, and ER visits in previous 12 months . Vitals . Screenings to include cognitive, depression, and falls . Referrals and appointments  In addition, I have reviewed and discussed with patient certain preventive protocols, quality metrics, and best practice recommendations. A written personalized care plan for preventive services as well as general preventive health recommendations were provided to patient.   Due to this being a telephonic visit, the after visit summary with patients personalized plan was offered to patient via mail or my-chart. Patient preferred to pick up at office at next visit.   Andrez Grime, LPN   08/02/3233

## 2020-03-24 NOTE — Progress Notes (Signed)
PCP notes:  Health Maintenance: Dexa-  due, will discuss with provider about doing this along with her mammogram this year.  Flu- due, discuss with provider before receiving this    Abnormal Screenings: none   Patient concerns: none   Nurse concerns: none   Next PCP appt.: 04/01/2020 @ 10:30 am

## 2020-03-24 NOTE — Patient Instructions (Signed)
[name] , Thank you for taking time to come for your Medicare Wellness Visit. I appreciate your ongoing commitment to your health goals. Please review the following plan we discussed and let me know if I can assist you in the future.   Screening recommendations/referrals: Colonoscopy: Up to date, completed 04/11/2014, due 03/2024 Mammogram: Up to date, completed 04/29/2019, due 04/2020 Bone Density: due, will discuss with provider at physical Recommended yearly ophthalmology/optometry visit for glaucoma screening and checkup Recommended yearly dental visit for hygiene and checkup  Vaccinations: Influenza vaccine: due, will discuss with provider first before getting this Pneumococcal vaccine: Completed series Tdap vaccine: Up to date, completed 03/30/2017, due 03/2027 Shingles vaccine: due, check with your insurance regarding coverage    Covid-19:Completed series  Advanced directives: Advance directive discussed with you today. I have provided a copy for you to complete at home and have notarized. Once this is complete please bring a copy in to our office so we can scan it into your chart.  Conditions/risks identified: hyperlipidemia  Next appointment: Follow up in one year for your annual wellness visit    Preventive Care 65 Years and Older, Female Preventive care refers to lifestyle choices and visits with your health care provider that can promote health and wellness. What does preventive care include?  A yearly physical exam. This is also called an annual well check.  Dental exams once or twice a year.  Routine eye exams. Ask your health care provider how often you should have your eyes checked.  Personal lifestyle choices, including:  Daily care of your teeth and gums.  Regular physical activity.  Eating a healthy diet.  Avoiding tobacco and drug use.  Limiting alcohol use.  Practicing safe sex.  Taking low-dose aspirin every day.  Taking vitamin and mineral  supplements as recommended by your health care provider. What happens during an annual well check? The services and screenings done by your health care provider during your annual well check will depend on your age, overall health, lifestyle risk factors, and family history of disease. Counseling  Your health care provider may ask you questions about your:  Alcohol use.  Tobacco use.  Drug use.  Emotional well-being.  Home and relationship well-being.  Sexual activity.  Eating habits.  History of falls.  Memory and ability to understand (cognition).  Work and work Statistician.  Reproductive health. Screening  You may have the following tests or measurements:  Height, weight, and BMI.  Blood pressure.  Lipid and cholesterol levels. These may be checked every 5 years, or more frequently if you are over 30 years old.  Skin check.  Lung cancer screening. You may have this screening every year starting at age 64 if you have a 30-pack-year history of smoking and currently smoke or have quit within the past 15 years.  Fecal occult blood test (FOBT) of the stool. You may have this test every year starting at age 83.  Flexible sigmoidoscopy or colonoscopy. You may have a sigmoidoscopy every 5 years or a colonoscopy every 10 years starting at age 41.  Hepatitis C blood test.  Hepatitis B blood test.  Sexually transmitted disease (STD) testing.  Diabetes screening. This is done by checking your blood sugar (glucose) after you have not eaten for a while (fasting). You may have this done every 1-3 years.  Bone density scan. This is done to screen for osteoporosis. You may have this done starting at age 81.  Mammogram. This may be done every  1-2 years. Talk to your health care provider about how often you should have regular mammograms. Talk with your health care provider about your test results, treatment options, and if necessary, the need for more tests. Vaccines  Your  health care provider may recommend certain vaccines, such as:  Influenza vaccine. This is recommended every year.  Tetanus, diphtheria, and acellular pertussis (Tdap, Td) vaccine. You may need a Td booster every 10 years.  Zoster vaccine. You may need this after age 18.  Pneumococcal 13-valent conjugate (PCV13) vaccine. One dose is recommended after age 43.  Pneumococcal polysaccharide (PPSV23) vaccine. One dose is recommended after age 18. Talk to your health care provider about which screenings and vaccines you need and how often you need them. This information is not intended to replace advice given to you by your health care provider. Make sure you discuss any questions you have with your health care provider. Document Released: 08/07/2015 Document Revised: 03/30/2016 Document Reviewed: 05/12/2015 Elsevier Interactive Patient Education  2017 [place] Prevention in the Home Falls can cause injuries. They can happen to people of all ages. There are many things you can do to make your home safe and to help prevent falls. What can I do on the outside of my home?  Regularly fix the edges of walkways and driveways and fix any cracks.  Remove anything that might make you trip as you walk through a door, such as a raised step or threshold.  Trim any bushes or trees on the path to your home.  Use bright outdoor lighting.  Clear any walking paths of anything that might make someone trip, such as rocks or tools.  Regularly check to see if handrails are loose or broken. Make sure that both sides of any steps have handrails.  Any raised decks and porches should have guardrails on the edges.  Have any leaves, snow, or ice cleared regularly.  Use sand or salt on walking paths during winter.  Clean up any spills in your garage right away. This includes oil or grease spills. What can I do in the bathroom?  Use night lights.  Install grab bars by the toilet and in the tub and  shower. Do not use towel bars as grab bars.  Use non-skid mats or decals in the tub or shower.  If you need to sit down in the shower, use a plastic, non-slip stool.  Keep the floor dry. Clean up any water that spills on the floor as soon as it happens.  Remove soap buildup in the tub or shower regularly.  Attach bath mats securely with double-sided non-slip rug tape.  Do not have throw rugs and other things on the floor that can make you trip. What can I do in the bedroom?  Use night lights.  Make sure that you have a light by your bed that is easy to reach.  Do not use any sheets or blankets that are too big for your bed. They should not hang down onto the floor.  Have a firm chair that has side arms. You can use this for support while you get dressed.  Do not have throw rugs and other things on the floor that can make you trip. What can I do in the kitchen?  Clean up any spills right away.  Avoid walking on wet floors.  Keep items that you use a lot in easy-to-reach places.  If you need to reach something above you, use a strong  step stool that has a grab bar.  Keep electrical cords out of the way.  Do not use floor polish or wax that makes floors slippery. If you must use wax, use non-skid floor wax.  Do not have throw rugs and other things on the floor that can make you trip. What can I do with my stairs?  Do not leave any items on the stairs.  Make sure that there are handrails on both sides of the stairs and use them. Fix handrails that are broken or loose. Make sure that handrails are as long as the stairways.  Check any carpeting to make sure that it is firmly attached to the stairs. Fix any carpet that is loose or worn.  Avoid having throw rugs at the top or bottom of the stairs. If you do have throw rugs, attach them to the floor with carpet tape.  Make sure that you have a light switch at the top of the stairs and the bottom of the stairs. If you do not  have them, ask someone to add them for you. What else can I do to help prevent falls?  Wear shoes that:  Do not have high heels.  Have rubber bottoms.  Are comfortable and fit you well.  Are closed at the toe. Do not wear sandals.  If you use a stepladder:  Make sure that it is fully opened. Do not climb a closed stepladder.  Make sure that both sides of the stepladder are locked into place.  Ask someone to hold it for you, if possible.  Clearly mark and make sure that you can see:  Any grab bars or handrails.  First and last steps.  Where the edge of each step is.  Use tools that help you move around (mobility aids) if they are needed. These include:  Canes.  Walkers.  Scooters.  Crutches.  Turn on the lights when you go into a dark area. Replace any light bulbs as soon as they burn out.  Set up your furniture so you have a clear path. Avoid moving your furniture around.  If any of your floors are uneven, fix them.  If there are any pets around you, be aware of where they are.  Review your medicines with your doctor. Some medicines can make you feel dizzy. This can increase your chance of falling. Ask your doctor what other things that you can do to help prevent falls. This information is not intended to replace advice given to you by your health care provider. Make sure you discuss any questions you have with your health care provider. Document Released: 05/07/2009 Document Revised: 12/17/2015 Document Reviewed: 08/15/2014 Elsevier Interactive Patient Education  2017 [employer].

## 2020-03-26 ENCOUNTER — Encounter: Payer: Medicare HMO | Admitting: Family Medicine

## 2020-04-01 ENCOUNTER — Ambulatory Visit (INDEPENDENT_AMBULATORY_CARE_PROVIDER_SITE_OTHER): Payer: Medicare HMO | Admitting: Family Medicine

## 2020-04-01 ENCOUNTER — Encounter: Payer: Self-pay | Admitting: Family Medicine

## 2020-04-01 ENCOUNTER — Other Ambulatory Visit: Payer: Self-pay

## 2020-04-01 ENCOUNTER — Other Ambulatory Visit: Payer: Self-pay | Admitting: Family Medicine

## 2020-04-01 VITALS — BP 112/70 | HR 57 | Temp 97.5°F | Ht 62.5 in | Wt 115.0 lb

## 2020-04-01 DIAGNOSIS — E785 Hyperlipidemia, unspecified: Secondary | ICD-10-CM

## 2020-04-01 DIAGNOSIS — Z0000 Encounter for general adult medical examination without abnormal findings: Secondary | ICD-10-CM

## 2020-04-01 DIAGNOSIS — E2839 Other primary ovarian failure: Secondary | ICD-10-CM

## 2020-04-01 DIAGNOSIS — M8589 Other specified disorders of bone density and structure, multiple sites: Secondary | ICD-10-CM

## 2020-04-01 DIAGNOSIS — Z23 Encounter for immunization: Secondary | ICD-10-CM | POA: Diagnosis not present

## 2020-04-01 DIAGNOSIS — E559 Vitamin D deficiency, unspecified: Secondary | ICD-10-CM

## 2020-04-01 DIAGNOSIS — E538 Deficiency of other specified B group vitamins: Secondary | ICD-10-CM | POA: Diagnosis not present

## 2020-04-01 DIAGNOSIS — Z1231 Encounter for screening mammogram for malignant neoplasm of breast: Secondary | ICD-10-CM

## 2020-04-01 NOTE — Assessment & Plan Note (Signed)
Very good control with simvastatin and diet  Disc goals for lipids and reasons to control them Rev last labs with pt Rev low sat fat diet in detail

## 2020-04-01 NOTE — Assessment & Plan Note (Signed)
Level in 2s Vitamin D level is therapeutic with current supplementation Disc importance of this to bone and overall health

## 2020-04-01 NOTE — Progress Notes (Signed)
Subjective:    Patient ID: Tracy [name], female    DOB: 1951-04-18, 69 y.o.   MRN: [id number]  This visit occurred during the SARS-CoV-2 public health emergency.  Safety protocols were in place, including screening questions prior to the visit, additional usage of staff PPE, and extensive cleaning of exam room while observing appropriate contact time as indicated for disinfecting solutions.    HPI Here for health maintenance exam and to review chronic medical problems   Wt Readings from Last 3 Encounters:  04/01/20 115 lb (52.2 kg)  03/17/20 115 lb (52.2 kg)  02/11/20 113 lb 6 oz (51.4 kg)   20.70 kg/m  Feels about the same  Everything just hurts now  Some days worse than others  Has chronic fatigue -worse lately -hard to function   She had amw on 03/24/20 Noted needs flu shot and possibly dexa   Was dealing with groin pain -poss due to hip OA  Referral done with ortho   Td 9/18 Is covid vaccinated Zoster status -zostavax 7/13 Afraid to get the flu shot due to her chronic fatigue    Mammogram 10/20 Self breast exam - no lumps or changes   colonoscopy 9/15   dexa 9/18 -osteopenia Completed a course of evista in 2014 Had GI side eff from [place] -had a fall going down steps and tripped over some shoes - is more careful now  No injury  Bumped head on rail- ok  fx -none Supplements  - vitamin d and ca  D level is 41.5 Exercise -none due to fatigue and chronic pain  (open to chair yoga)    BP Readings from Last 3 Encounters:  04/01/20 112/70  03/17/20 136/72  02/11/20 106/68   Pulse Readings from Last 3 Encounters:  04/01/20 (!) 57  03/17/20 68  02/11/20 75   Eats healthy    Hyperlipidemia  Lab Results  Component Value Date   CHOL 179 03/24/2020   CHOL 188 03/21/2019   CHOL 173 03/08/2018   Lab Results  Component Value Date   HDL 71.30 03/24/2020   HDL 75.00 03/21/2019   HDL 72.80 03/08/2018   Lab Results  Component Value Date    LDLCALC 89 03/24/2020   [place] 95 03/21/2019   LDLCALC 87 03/08/2018   Lab Results  Component Value Date   TRIG 96.0 03/24/2020   TRIG 90.0 03/21/2019   TRIG 68.0 03/08/2018   Lab Results  Component Value Date   CHOLHDL 3 03/24/2020   CHOLHDL 3 03/21/2019   CHOLHDL 2 03/08/2018   Lab Results  Component Value Date   LDLDIRECT 172.6 05/20/2013  simvastatin and diet  Well controlled    B12 def  Lab Results  Component Value Date   VITAMINB12 762 03/24/2020    Taking 500 mcg B12 daily     Other labs Lab Results  Component Value Date   CREATININE 0.66 03/24/2020   BUN 9 03/24/2020   NA 142 03/24/2020   K 4.0 03/24/2020   CL 106 03/24/2020   CO2 30 03/24/2020   Glucose 78 Lab Results  Component Value Date   ALT 16 03/24/2020   AST 21 03/24/2020   ALKPHOS 45 03/24/2020   BILITOT 0.9 03/24/2020   Lab Results  Component Value Date   WBC 5.6 03/24/2020   HGB 13.9 03/24/2020   HCT 41.5 03/24/2020   MCV 93.9 03/24/2020   PLT 176.0 03/24/2020   Lab Results  Component Value Date   TSH  2.08 03/24/2020         Review of Systems  Constitutional: Positive for fatigue. Negative for activity change, appetite change, fever and unexpected weight change.  HENT: Negative for congestion, ear pain, rhinorrhea, sinus pressure and sore throat.   Eyes: Negative for pain, redness and visual disturbance.  Respiratory: Negative for cough, shortness of breath and wheezing.   Cardiovascular: Negative for chest pain and palpitations.  Gastrointestinal: Negative for abdominal pain, blood in stool, constipation and diarrhea.  Endocrine: Negative for polydipsia and polyuria.  Genitourinary: Negative for dysuria, frequency and urgency.  Musculoskeletal: Positive for arthralgias, back pain and myalgias. Negative for joint swelling.  Skin: Negative for pallor and rash.  Allergic/Immunologic: Negative for environmental allergies.  Neurological: Negative for dizziness, syncope  and headaches.  Hematological: Negative for adenopathy. Does not bruise/bleed easily.  Psychiatric/Behavioral: Negative for decreased concentration and dysphoric mood. The patient is not nervous/anxious.        Objective:   Physical Exam Constitutional:      General: She is not in acute distress.    Appearance: Normal appearance. She is well-developed and normal weight. She is not ill-appearing or diaphoretic.  HENT:     Head: Normocephalic and atraumatic.     Right Ear: Tympanic membrane, ear canal and external ear normal.     Left Ear: Tympanic membrane, ear canal and external ear normal.     Nose: Nose normal. No congestion.     Mouth/Throat:     Mouth: Mucous membranes are moist.     Pharynx: Oropharynx is clear. No posterior oropharyngeal erythema.  Eyes:     General: No scleral icterus.    Extraocular Movements: Extraocular movements intact.     Conjunctiva/sclera: Conjunctivae normal.     Pupils: Pupils are equal, round, and reactive to light.  Neck:     Thyroid: No thyromegaly.     Vascular: No carotid bruit or JVD.  Cardiovascular:     Rate and Rhythm: Normal rate and regular rhythm.     Pulses: Normal pulses.     Heart sounds: Normal heart sounds. No gallop.   Pulmonary:     Effort: Pulmonary effort is normal. No respiratory distress.     Breath sounds: Normal breath sounds. No wheezing.     Comments: Good air exch Chest:     Chest wall: No tenderness.  Abdominal:     General: Bowel sounds are normal. There is no distension or abdominal bruit.     Palpations: Abdomen is soft. There is no mass.     Tenderness: There is no abdominal tenderness.     Hernia: No hernia is present.  Genitourinary:    Comments: Breast exam: No mass, nodules, thickening, tenderness, bulging, retraction, inflamation, nipple discharge or skin changes noted.  No axillary or clavicular LA.     Musculoskeletal:        General: No tenderness. Normal range of motion.     Cervical back: Normal  range of motion and neck supple. No rigidity. No muscular tenderness.     Right lower leg: No edema.     Left lower leg: No edema.     Comments: No kyphosis Some scoliosis   Lymphadenopathy:     Cervical: No cervical adenopathy.  Skin:    General: Skin is warm and dry.     Coloration: Skin is not pale.     Findings: No erythema or rash.     Comments: Fair  Some angiomas and sks   Neurological:  Mental Status: She is alert. Mental status is at baseline.     Cranial Nerves: No cranial nerve deficit.     Motor: No abnormal muscle tone.     Coordination: Coordination normal.     Gait: Gait normal.     Deep Tendon Reflexes: Reflexes are normal and symmetric. Reflexes normal.  Psychiatric:        Mood and Affect: Mood normal.        Cognition and Memory: Cognition and memory normal.     Comments: Pleasant and talkative           Assessment & Plan:   Problem List Items Addressed This Visit      Musculoskeletal and Integument   Osteopenia (Chronic)    Due for dexa Ordered for [place]-she will call to schedule this with her mammogram in October No fractures Has fallen Fall prev discussed in detail  Continues ca and D (D level is therapeutic)        Other   Hyperlipidemia with target LDL less than 130 (Chronic)    Very good control with simvastatin and diet  Disc goals for lipids and reasons to control them Rev last labs with pt Rev low sat fat diet in detail       Vitamin D deficiency    Level in 75s Vitamin D level is therapeutic with current supplementation Disc importance of this to bone and overall health       Routine general medical examination at a health care facility - Primary    Reviewed health habits including diet and exercise and skin cancer prevention Reviewed appropriate screening tests for age  Also reviewed health mt list, fam hx and immunization status , as well as social and family history   See HPI Labs reviewed  amw reviewed Flu shot  given  Had covid vaccines  Disc shingrix-may consider in future Disc chair yoga as an exercise option to do at home Ordered dexa to get with her mammo in oct -she will call to schedule the appt         Vitamin B12 deficiency    Lab Results  Component Value Date   [id number] 354 03/24/2020   Continues current oral supplementation       Estrogen deficiency   Relevant Orders   DG Bone Density

## 2020-04-01 NOTE — Assessment & Plan Note (Signed)
Due for dexa Ordered for Tracy [name] will call to schedule this with her mammogram in October No fractures Has fallen Fall prev discussed in detail  Continues ca and D (D level is therapeutic)

## 2020-04-01 NOTE — Patient Instructions (Addendum)
Flu shot today   If you are interested in the new shingles vaccine (Shingrix) - call your local pharmacy to check on coverage and availability  If affordable, get on a wait list at your pharmacy to get the vaccine.  You can call and schedule bone density and mammogram at [facility] into a chair yoga program (u tube)

## 2020-04-01 NOTE — Assessment & Plan Note (Signed)
Lab Results  Component Value Date   [id number] 329 03/24/2020   Continues current oral supplementation

## 2020-04-01 NOTE — Assessment & Plan Note (Signed)
Reviewed health habits including diet and exercise and skin cancer prevention Reviewed appropriate screening tests for age  Also reviewed health mt list, fam hx and immunization status , as well as social and family history   See HPI Labs reviewed  amw reviewed Flu shot given  Had covid vaccines  Disc shingrix-may consider in future Disc chair yoga as an exercise option to do at home Ordered dexa to get with her mammo in oct -she will call to schedule the appt

## 2020-04-06 DIAGNOSIS — R102 Pelvic and perineal pain: Secondary | ICD-10-CM | POA: Diagnosis not present

## 2020-04-09 DIAGNOSIS — Z0100 Encounter for examination of eyes and vision without abnormal findings: Secondary | ICD-10-CM | POA: Diagnosis not present

## 2020-04-09 DIAGNOSIS — H524 Presbyopia: Secondary | ICD-10-CM | POA: Diagnosis not present

## 2020-04-15 ENCOUNTER — Other Ambulatory Visit: Payer: Self-pay

## 2020-04-15 ENCOUNTER — Encounter: Payer: Self-pay | Admitting: Family Medicine

## 2020-04-15 ENCOUNTER — Ambulatory Visit (INDEPENDENT_AMBULATORY_CARE_PROVIDER_SITE_OTHER): Payer: Medicare HMO | Admitting: Family Medicine

## 2020-04-15 VITALS — BP 116/64 | HR 72 | Temp 97.7°F | Ht 62.5 in | Wt 115.2 lb

## 2020-04-15 DIAGNOSIS — R102 Pelvic and perineal pain: Secondary | ICD-10-CM | POA: Diagnosis not present

## 2020-04-15 DIAGNOSIS — N898 Other specified noninflammatory disorders of vagina: Secondary | ICD-10-CM | POA: Insufficient documentation

## 2020-04-15 DIAGNOSIS — R1032 Left lower quadrant pain: Secondary | ICD-10-CM

## 2020-04-15 DIAGNOSIS — R1031 Right lower quadrant pain: Secondary | ICD-10-CM

## 2020-04-15 NOTE — Assessment & Plan Note (Signed)
On and off  No discharge Some pelvic pain  Past yeast on wet prep-treated and symptoms returned  Ref made to gyn  Continues estrace cream

## 2020-04-15 NOTE — Assessment & Plan Note (Signed)
Groin and pelvis Generally worse on L  Neg ortho eval  Reassuring CT scan  Some vaginal symptoms  Will ref to gyn for further eval /tx  Small introitus -unable to do speculum exam here in the past Using estrace cream

## 2020-04-15 NOTE — Patient Instructions (Signed)
I placed a referral to gyn for further evaluation of vaginal irritation and pelvic pain and bladder prolapse   Our office will call you to set that

## 2020-04-15 NOTE — Assessment & Plan Note (Signed)
Seen by orthopedics and est this is not musculoskeletal  Note reviewed

## 2020-04-15 NOTE — Progress Notes (Signed)
Subjective:    Patient ID: Tracy [name], female    DOB: [birth year], 69 y.o.   MRN: [id number]  This visit occurred during the SARS-CoV-2 public health emergency.  Safety protocols were in place, including screening questions prior to the visit, additional usage of staff PPE, and extensive cleaning of exam room while observing appropriate contact time as indicated for disinfecting solutions.    HPI  Pt presents for f/u of pelvic/perineal pain   Wt Readings from Last 3 Encounters:  04/15/20 115 lb 3 oz (52.2 kg)  04/01/20 115 lb (52.2 kg)  03/17/20 115 lb (52.2 kg)   20.73 kg/m  xrays showed mild narrowing of hip joints Pt was seen at [facility] and thought that this was not causing her pain   Thought more likely abd/pelvic etiology  Known cystocele/bladder prolapse   Estrace cream   Has had hysterectomy and removal of one ovary  Recent CT: CT Abdomen Pelvis Wo Contrast (Accession [id number]) (Order [id number]) Imaging Date: 03/11/2020 Department: [facility] Released By: Brunilda Payor Authorizing: Victoriano Campion, Wynelle Fanny, MD  Exam Status  Status  Final [99]  PACS Intelerad Image Link  Show images for CT Abdomen Pelvis Wo Contrast Study Result  Narrative & Impression  CLINICAL DATA:  Abdominal pain, LEFT lower quadrant and pelvic pain for 1 month, fever  EXAM: CT ABDOMEN AND PELVIS WITHOUT CONTRAST  TECHNIQUE: Multidetector CT imaging of the abdomen and pelvis was performed following the standard protocol without IV contrast. Sagittal and coronal MPR images reconstructed from axial data set.  COMPARISON:  10/13/15  FINDINGS: Lower chest: Lung bases clear  Hepatobiliary: Gallbladder and liver normal appearance  Pancreas: Normal appearance  Spleen: Normal appearance  Adrenals/Urinary Tract: 4 mm angiomyolipoma at upper pole LEFT kidney. Adrenal glands, kidneys, and ureters otherwise normal appearance. Low descent of bladder in  pelvis question cystocele.  Stomach/Bowel: Appendix normal appearance. Stomach and bowel loops normal appearance.  Vascular/Lymphatic: Minimal scattered atherosclerotic calcification RIGHT common iliac artery.  Reproductive: Uterus surgically absent with nonvisualization of ovaries.  Other: No free air or free fluid.  No hernia.  Musculoskeletal: Mild dextroconvex scoliosis. Osseous demineralization.  IMPRESSION: Probable cystocele.  4 mm angiomyolipoma at upper pole LEFT kidney.  No acute intra-abdominal or intrapelvic abnormalities.   Electronically Signed   By: Lavonia Dana M.D.   On: 03/11/2020 10:23     When we attempted pelvic exam in July - unable to use speculum due to very small introitus  Has no M or tenderness on bimanual exam  ua at that time was entirely negative  Results for Tracy [name], Tracy [name] (MRN [id number]) as of 04/15/2020 11:16  Ref. Range 02/11/2020 10:08  Bilirubin, UA Unknown Negative  Clarity, UA Unknown Clear  Color, UA Unknown Light Yellow  Glucose Latest Ref Range: Negative  Negative  Ketones, UA Unknown Negative  Leukocytes,UA Latest Ref Range: Negative  Negative  Nitrite, UA Unknown Negative  pH, UA Latest Ref Range: 5.0 - 8.0  6.0  Protein,UA Latest Ref Range: Negative  Negative  Specific Gravity, UA Latest Ref Range: 1.010 - 1.025  1.015  Urobilinogen, UA Latest Ref Range: 0.2 or 1.0 E.U./dL 0.2  RBC, UA Unknown Negative  Wet prep showed mild yeast   Took diflucan several times and then symptoms return  Pt again mentions that this all started one day after using the bathroom at church- she started to have symptoms immed after that  Felt burning (everywhere the toilet paper touched)  It still feels very irritated -when she wipes  Pelvic pain continues  She had to take some tylenol on Saturday   Patient Active Problem List   Diagnosis Date Noted   Vaginal irritation 04/15/2020   Pelvic pain 04/15/2020   Bilateral  groin pain 03/17/2020   Angiomyolipoma of left kidney 03/11/2020   Left lower quadrant abdominal pain 03/04/2020   Yeast cells and fungal elements present on diagnostic testing 02/11/2020   Welcome to Medicare preventive visit 03/03/2017   Estrogen deficiency 03/03/2017   Borderline low blood pressure determined by examination 10/31/2016   Vitamin B12 deficiency 10/31/2016   Chest pain with moderate risk for cardiac etiology 10/17/2014   Colon cancer screening 02/17/2014   Other screening mammogram 02/07/2011   Routine general medical examination at a health care facility 02/01/2011   Vitamin D deficiency 04/14/2008   Osteopenia 01/09/2007   Hyperlipidemia with target LDL less than 130 01/04/2007   Atrophic vaginitis 01/04/2007   SCOLIOSIS 01/04/2007   CHRONIC FATIGUE SYNDROME 01/04/2007   Past Medical History:  Diagnosis Date   Chronic fatigue syndrome    Hyperlipidemia    Osteopenia    S/P cervical polypectomy    Past Surgical History:  Procedure Laterality Date   ABDOMINAL HYSTERECTOMY     one ovary removed/ bleeding   DILATION AND CURETTAGE OF UTERUS     Social History   Tobacco Use   Smoking status: Never Smoker   Smokeless tobacco: Never Used  Vaping Use   Vaping Use: Never used  Substance Use Topics   Alcohol use: No    Alcohol/week: 0.0 standard drinks   Drug use: No   Family History  Problem Relation Age of Onset   Stroke Mother    Hypertension Father    Stroke Father    Hyperlipidemia Sister    Hyperlipidemia Brother    Cancer Paternal Uncle    Cancer Maternal Grandmother        intestinal CA   Cancer Paternal Grandfather        CA unsure which kind   Hyperlipidemia Brother    Heart attack Sister        Thought to be silent MI   Breast cancer Neg Hx    Allergies  Allergen Reactions   Alendronate Sodium     REACTION: GI   Amoxicillin     REACTION: vomiting   Ibuprofen     REACTION: rash   Ivp Dye  [Iodinated Diagnostic Agents]    Current Outpatient Medications on File Prior to Visit  Medication Sig Dispense Refill   Calcium-Vitamin D-Vitamin K (VIACTIV CALCIUM PLUS D) 650-12.5-40 MG-MCG-MCG CHEW Chew 2 tablets by mouth daily.     Cholecalciferol (VITAMIN D) 2000 UNITS CAPS Take 1 capsule by mouth daily.       cyanocobalamin 1000 MCG tablet Take 500 mcg by mouth daily.      estradiol (ESTRACE VAGINAL) 0.1 MG/GM vaginal cream Use pea sized amount of cream to vaginal area twice weekly 42.5 g 0   simvastatin (ZOCOR) 40 MG tablet TAKE (1) TABLET BY MOUTH DAILY AT BEDTIME 90 tablet 3   No current facility-administered medications on file prior to visit.    Review of Systems  Constitutional: Negative for activity change, appetite change, fatigue, fever and unexpected weight change.  HENT: Negative for congestion, ear pain, rhinorrhea, sinus pressure and sore throat.   Eyes: Negative for pain, redness and visual disturbance.  Respiratory: Negative for cough, shortness of breath and wheezing.  Cardiovascular: Negative for chest pain and palpitations.  Gastrointestinal: Negative for abdominal pain, blood in stool, constipation and diarrhea.  Endocrine: Negative for polydipsia and polyuria.  Genitourinary: Positive for pelvic pain and vaginal pain. Negative for dysuria, frequency, hematuria, urgency, vaginal bleeding and vaginal discharge.  Musculoskeletal: Negative for arthralgias, back pain and myalgias.  Skin: Negative for pallor and rash.  Allergic/Immunologic: Negative for environmental allergies.  Neurological: Negative for dizziness, syncope and headaches.  Hematological: Negative for adenopathy. Does not bruise/bleed easily.  Psychiatric/Behavioral: Negative for decreased concentration and dysphoric mood. The patient is not nervous/anxious.        Objective:   Physical Exam Constitutional:      General: She is not in acute distress.    Appearance: Normal appearance. She is  normal weight.  Eyes:     General: No scleral icterus.    Conjunctiva/sclera: Conjunctivae normal.     Pupils: Pupils are equal, round, and reactive to light.  Cardiovascular:     Rate and Rhythm: Normal rate and regular rhythm.  Pulmonary:     Effort: Pulmonary effort is normal. No respiratory distress.  Abdominal:     General: Abdomen is flat. Bowel sounds are normal. There is no distension.     Palpations: Abdomen is soft. There is no mass.     Tenderness: There is no abdominal tenderness. There is no right CVA tenderness, left CVA tenderness, guarding or rebound.     Hernia: No hernia is present.  Skin:    Coloration: Skin is not pale.  Neurological:     Mental Status: She is alert.  Psychiatric:        Mood and Affect: Mood normal.           Assessment & Plan:   Problem List Items Addressed This Visit      Other   Bilateral groin pain - Primary    Seen by orthopedics and est this is not musculoskeletal  Note reviewed        Relevant Orders   Ambulatory referral to Gynecology   Vaginal irritation    On and off  No discharge Some pelvic pain  Past yeast on wet prep-treated and symptoms returned  Ref made to gyn  Continues estrace cream       Relevant Orders   Ambulatory referral to Gynecology   Pelvic pain    Groin and pelvis Generally worse on L  Neg ortho eval  Reassuring CT scan  Some vaginal symptoms  Will ref to gyn for further eval /tx  Small introitus -unable to do speculum exam here in the past Using estrace cream      Relevant Orders   Ambulatory referral to Gynecology

## 2020-04-28 ENCOUNTER — Other Ambulatory Visit: Payer: Self-pay

## 2020-04-28 ENCOUNTER — Ambulatory Visit (INDEPENDENT_AMBULATORY_CARE_PROVIDER_SITE_OTHER): Payer: Medicare HMO | Admitting: Obstetrics & Gynecology

## 2020-04-28 ENCOUNTER — Encounter: Payer: Self-pay | Admitting: Obstetrics & Gynecology

## 2020-04-28 VITALS — BP 118/69 | HR 73 | Ht 62.0 in | Wt 115.8 lb

## 2020-04-28 DIAGNOSIS — R1032 Left lower quadrant pain: Secondary | ICD-10-CM

## 2020-04-28 DIAGNOSIS — N8110 Cystocele, unspecified: Secondary | ICD-10-CM

## 2020-04-28 DIAGNOSIS — N898 Other specified noninflammatory disorders of vagina: Secondary | ICD-10-CM

## 2020-04-28 MED ORDER — TRIAMCINOLONE ACETONIDE 0.5 % EX CREA: 1.0000 "application " | TOPICAL_CREAM | Freq: Every day | CUTANEOUS | 1 refills | Status: AC

## 2020-04-28 NOTE — Progress Notes (Addendum)
GYNECOLOGY OFFICE VISIT NOTE  History:   Tracy [name] is a 69 y.o. PMP female here for evaluation of sporadic vaginal irritation and pelvic pain.  Also reports bilateral lower abdominal pain, left greater than right that radiates upwards. Had negative Orthopedic evaluation and CT scan on 03/11/2020 showed surgically absent uterus, adnexa not visualized (had surgical removal of right ovary in past), probable cystocele.  Felt her vaginal irritation started in July 11 after using toilet paper in church, concerned it may have had some cleaning agents on it.   Using Estrace cream as prescribed by PCP, but says she is not able to have intercourse because of the irritation.  Had a yeast infection that was treated by her PCP, symptoms got better temporarily and came back afterwards.  Reports having some urinary problems chronically, such as feeling like she did not empty her bladder completely and occasional urgency but nothing acute.  She denies any abnormal vaginal discharge, bleeding or other concerns.    Past Medical History:  Diagnosis Date  . Chronic fatigue syndrome   . Hyperlipidemia   . Osteopenia     Past Surgical History:  Procedure Laterality Date  . ABDOMINAL HYSTERECTOMY     one ovary removed/ bleeding  . CERVICAL POLYPECTOMY    . DILATION AND CURETTAGE OF UTERUS    . UNILATERAL SALPINGECTOMY Right     The following portions of the patient's history were reviewed and updated as appropriate: allergies, current medications, past family history, past medical history, past social history, past surgical history and problem list.   Review of Systems:  Pertinent items noted in HPI and remainder of comprehensive ROS otherwise negative.  Physical Exam:  BP 118/69   Pulse 73   Ht 5\' 2"  (1.575 m)   Wt 115 lb 12.8 oz (52.5 kg)   BMI 21.18 kg/m  CONSTITUTIONAL: Well-developed, well-nourished female in no acute distress.  HEENT:  Normocephalic, atraumatic. External right and left  ear normal. No scleral icterus.  NECK: Normal range of motion, supple, no masses noted on observation SKIN: No rash noted. Not diaphoretic. No erythema. No pallor. MUSCULOSKELETAL: Normal range of motion. No edema noted. NEUROLOGIC: Alert and oriented to person, place, and time. Normal muscle tone coordination. No cranial nerve deficit noted. PSYCHIATRIC: Normal mood and affect. Normal behavior. Normal judgment and thought content. CARDIOVASCULAR: Normal heart rate noted RESPIRATORY: Effort and breath sounds normal, no problems with respiration noted ABDOMEN: No masses noted. No other overt distention noted.  Mild LLQ pain to deep palpation.  PELVIC: External genitalia with moderate atrophy; normal urethral meatus.  Grade 2 cystocele seen.  Moderate tenderness on palpation of the cystocele/anterior vaginal wall,  atrophic vaginal mucosa seen.  No abnormal discharge noted. Reported irritation at the superior confluence of labia minora/clitoral hood, but no erythema or lesions found there or anywhere.  Performed in the presence of a chaperone.  Labs and Imaging CT Abdomen Pelvis Wo Contrast  Result Date: 03/11/2020 CLINICAL DATA:  Abdominal pain, LEFT lower quadrant and pelvic pain for 1 month, fever EXAM: CT ABDOMEN AND PELVIS WITHOUT CONTRAST TECHNIQUE: Multidetector CT imaging of the abdomen and pelvis was performed following the standard protocol without IV contrast. Sagittal and coronal MPR images reconstructed from axial data set. COMPARISON:  10/13/15 FINDINGS: Lower chest: Lung bases clear Hepatobiliary: Gallbladder and liver normal appearance Pancreas: Normal appearance Spleen: Normal appearance Adrenals/Urinary Tract: 4 mm angiomyolipoma at upper pole LEFT kidney. Adrenal glands, kidneys, and ureters otherwise normal appearance. Low descent  of bladder in pelvis question cystocele. Stomach/Bowel: Appendix normal appearance. Stomach and bowel loops normal appearance. Vascular/Lymphatic: Minimal  scattered atherosclerotic calcification RIGHT common iliac artery. Reproductive: Uterus surgically absent with nonvisualization of ovaries. Other: No free air or free fluid.  No hernia. Musculoskeletal: Mild dextroconvex scoliosis. Osseous demineralization. IMPRESSION: Probable cystocele. 4 mm angiomyolipoma at upper pole LEFT kidney. No acute intra-abdominal or intrapelvic abnormalities. Electronically Signed   By: Lavonia Dana M.D.   On: 03/11/2020 10:23   DG HIPS BILAT W OR W/O PELVIS 3-4 VIEWS  Result Date: 03/17/2020 CLINICAL DATA:  Pain EXAM: DG HIP (WITH OR WITHOUT PELVIS) 3-4V BILAT COMPARISON:  None. FINDINGS: Weightbearing frontal pelvis; weight-bearing frontal views of each hip as well as lateral views of each hip-total five views. No fracture or dislocation. There is slight symmetric narrowing of each hip joint. No erosive change. Sacroiliac joints appear unremarkable bilaterally. There is lower lumbar dextroscoliosis. IMPRESSION: Slight symmetric narrowing of each hip joint. No fracture or dislocation. No erosion. Electronically Signed   By: Lowella Grip III M.D.   On: 03/17/2020 15:39      Assessment and Plan:     1. Left lower quadrant abdominal pain Unknown etiology. Atrophic left ovary/not seen on imaging, and ovary location is expected to be much lower down given absence of uterus and atrophic nature. Also pattern of pain is not consistent with ovarian pain. Offered pelvic ultrasound to try to visualize ovary, she declined. Pain could be musculoskeletal or nerve related; does not seem GI related. May consider neuromodulator medication if pain continues/further evaluation.  2. Vaginal irritation Encouraged to try steroid cream to see if it helps. No erythema or lesions seen. Will reevaluate - triamcinolone cream (KENALOG) 0.5 %; Apply 1 application topically daily for 14 days. Apply to affected area  Dispense: 15 g; Refill: 1  3. Female cystocele Concerned about tenderness, will  evaluate for UTI. Cystocele usually causes bulge sensation but not usually pain. - Urine Culture Discussed possible referral to Urogynecology also given her urinary symptoms, she wants to defer for now.  Will continue to monitor.  Routine preventative health maintenance measures emphasized. Please refer to After Visit Summary for other counseling recommendations.   Return in about 3 weeks (around 05/19/2020) for Followup vaginal irritation.    Total face-to-face time with patient: 30 minutes.  Over 50% of encounter was spent on counseling and coordination of care.   Verita Schneiders, MD, [place] [facility] [employer], [employer]

## 2020-04-29 ENCOUNTER — Other Ambulatory Visit: Payer: Self-pay

## 2020-04-29 ENCOUNTER — Ambulatory Visit
Admission: RE | Admit: 2020-04-29 | Discharge: 2020-04-29 | Disposition: A | Discharge status: Home / Self Care | Payer: Medicare HMO | Source: Ambulatory Visit | Attending: Family Medicine | Admitting: Family Medicine

## 2020-04-29 DIAGNOSIS — R29890 Loss of height: Secondary | ICD-10-CM | POA: Diagnosis not present

## 2020-04-29 DIAGNOSIS — Z1231 Encounter for screening mammogram for malignant neoplasm of breast: Secondary | ICD-10-CM | POA: Insufficient documentation

## 2020-04-29 DIAGNOSIS — Z8739 Personal history of other diseases of the musculoskeletal system and connective tissue: Secondary | ICD-10-CM | POA: Diagnosis not present

## 2020-04-29 DIAGNOSIS — E2839 Other primary ovarian failure: Secondary | ICD-10-CM | POA: Diagnosis not present

## 2020-04-29 DIAGNOSIS — Z780 Asymptomatic menopausal state: Secondary | ICD-10-CM | POA: Diagnosis not present

## 2020-04-29 LAB — URINE CULTURE: Organism ID, Bacteria: NO GROWTH

## 2020-05-06 ENCOUNTER — Encounter: Payer: Self-pay | Admitting: Family Medicine

## 2020-05-06 ENCOUNTER — Ambulatory Visit (INDEPENDENT_AMBULATORY_CARE_PROVIDER_SITE_OTHER): Payer: Medicare HMO | Admitting: Family Medicine

## 2020-05-06 ENCOUNTER — Other Ambulatory Visit: Payer: Self-pay

## 2020-05-06 VITALS — BP 118/70 | HR 55 | Temp 97.5°F | Ht 62.0 in | Wt 114.4 lb

## 2020-05-06 DIAGNOSIS — M810 Age-related osteoporosis without current pathological fracture: Secondary | ICD-10-CM | POA: Diagnosis not present

## 2020-05-06 NOTE — Assessment & Plan Note (Signed)
Reviewed recent dexa with lowest TS -2.7 in LFN  No fractures but has fallen (discussed fall prevention) taking ca and D Slim and caucasian and fam hx of OP with fx (mother) No steroids or ppis  Disc options for tx  Has had a drug holiday from evista-could re start  Intol of oral bisphosponate (gi intol) -could consider reclast  prolia would be option if covered by ins Given handouts on OP and all of these medications to consider Nl ca and nl dentition-suspect she would do fine with any ts She will call back with her choice after reading and checking with ins cov

## 2020-05-06 NOTE — Progress Notes (Signed)
Subjective:    Patient ID: Tracy [name], female    DOB: 11/14/1950, 69 y.o.   MRN: [id number]  This visit occurred during the SARS-CoV-2 public health emergency.  Safety protocols were in place, including screening questions prior to the visit, additional usage of staff PPE, and extensive cleaning of exam room while observing appropriate contact time as indicated for disinfecting solutions.    HPI Pt presents to discuss bone density/osteoporosis   dexa done 10/6 T score for LFN is -2.7 (down from -2.4) fotal femur -1.8 Forearm -0.8   She completed a course of evista (5 y) in 2014 Unable to tolerate alendronate due to GI side eff   In the past year has had trip and fall/stairs but no fractures  Taking ca and D No regular exercise due to fatigue and chronic pain   Slim  Caucasian   Family history - mother was petite and never formally dx with OP but did break hip at age 101    No personal h/o thyroid supplementation or steroid use  Not taking ppi or seizure medications   Lab Results  Component Value Date   CREATININE 0.66 03/24/2020   BUN 9 03/24/2020   NA 142 03/24/2020   K 4.0 03/24/2020   CL 106 03/24/2020   CO2 30 03/24/2020   Lab Results  Component Value Date   TSH 2.08 03/24/2020    Ca level nl at 9.2  Vit D level of 41.55    Takes 2000 iu in addn to a ca/D combination pill  No dental problems - just has regular cleanings   Patient Active Problem List   Diagnosis Date Noted  . Vaginal irritation 04/15/2020  . Pelvic pain 04/15/2020  . Bilateral groin pain 03/17/2020  . Angiomyolipoma of left kidney 03/11/2020  . Left lower quadrant abdominal pain 03/04/2020  . Yeast cells and fungal elements present on diagnostic testing 02/11/2020  . Welcome to Medicare preventive visit 03/03/2017  . Estrogen deficiency 03/03/2017  . Borderline low blood pressure determined by examination 10/31/2016  . Vitamin B12 deficiency 10/31/2016  . Chest pain with  moderate risk for cardiac etiology 10/17/2014  . Colon cancer screening 02/17/2014  . Other screening mammogram 02/07/2011  . Routine general medical examination at a health care facility 02/01/2011  . Vitamin D deficiency 04/14/2008  . Osteoporosis 01/09/2007  . Hyperlipidemia with target LDL less than 130 01/04/2007  . Atrophic vaginitis 01/04/2007  . SCOLIOSIS 01/04/2007  . CHRONIC FATIGUE SYNDROME 01/04/2007   Past Medical History:  Diagnosis Date  . Chronic fatigue syndrome   . Hyperlipidemia   . Osteopenia    Past Surgical History:  Procedure Laterality Date  . ABDOMINAL HYSTERECTOMY     one ovary removed/ bleeding  . CERVICAL POLYPECTOMY    . DILATION AND CURETTAGE OF UTERUS    . UNILATERAL SALPINGECTOMY Right    Social History   Tobacco Use  . Smoking status: Never Smoker  . Smokeless tobacco: Never Used  Vaping Use  . Vaping Use: Never used  Substance Use Topics  . Alcohol use: No    Alcohol/week: 0.0 standard drinks  . Drug use: No   Family History  Problem Relation Age of Onset  . Stroke Mother   . Hypertension Father   . Stroke Father   . Hyperlipidemia Sister   . Hyperlipidemia Brother   . Cancer Paternal Uncle   . Cancer Maternal Grandmother        intestinal CA  .  Cancer Paternal Grandfather        CA unsure which kind  . Hyperlipidemia Brother   . Heart attack Sister        Thought to be silent MI  . Breast cancer Neg Hx    Allergies  Allergen Reactions  . Alendronate Sodium     REACTION: GI  . Amoxicillin     REACTION: vomiting  . Ibuprofen     REACTION: rash  . Ivp Dye [Iodinated Diagnostic Agents]    Current Outpatient Medications on File Prior to Visit  Medication Sig Dispense Refill  . Calcium-Vitamin D-Vitamin K (VIACTIV CALCIUM PLUS D) 650-12.5-40 MG-MCG-MCG CHEW Chew 2 tablets by mouth daily.    . Cholecalciferol (VITAMIN D) 2000 UNITS CAPS Take 1 capsule by mouth daily.      . cyanocobalamin 1000 MCG tablet Take 500 mcg by  mouth daily.     Marland Kitchen estradiol (ESTRACE VAGINAL) 0.1 MG/GM vaginal cream Use pea sized amount of cream to vaginal area twice weekly 42.5 g 0  . simvastatin (ZOCOR) 40 MG tablet TAKE (1) TABLET BY MOUTH DAILY AT BEDTIME 90 tablet 3  . triamcinolone cream (KENALOG) 0.5 % Apply 1 application topically daily for 14 days. Apply to affected area 15 g 1   No current facility-administered medications on file prior to visit.    Review of Systems  Constitutional: Negative for activity change, appetite change, fatigue, fever and unexpected weight change.  HENT: Negative for congestion, ear pain, rhinorrhea, sinus pressure and sore throat.   Eyes: Negative for pain, redness and visual disturbance.  Respiratory: Negative for cough, shortness of breath and wheezing.   Cardiovascular: Negative for chest pain and palpitations.  Gastrointestinal: Negative for abdominal pain, blood in stool, constipation and diarrhea.  Endocrine: Negative for polydipsia and polyuria.  Genitourinary: Negative for dysuria, frequency and urgency.  Musculoskeletal: Positive for arthralgias. Negative for back pain and myalgias.  Skin: Negative for pallor and rash.  Allergic/Immunologic: Negative for environmental allergies.  Neurological: Negative for dizziness, syncope and headaches.  Hematological: Negative for adenopathy. Does not bruise/bleed easily.  Psychiatric/Behavioral: Negative for decreased concentration and dysphoric mood. The patient is not nervous/anxious.        Objective:   Physical Exam Constitutional:      General: She is not in acute distress.    Appearance: Normal appearance. She is normal weight. She is not ill-appearing.  Eyes:     General: No scleral icterus.    Conjunctiva/sclera: Conjunctivae normal.     Pupils: Pupils are equal, round, and reactive to light.  Cardiovascular:     Rate and Rhythm: Bradycardia present.  Musculoskeletal:     Cervical back: Normal range of motion.     Comments: No  kyphosis -very good posture  Petite with very small frame  Skin:    Comments: Fair complexion  Neurological:     Mental Status: She is alert.  Psychiatric:        Mood and Affect: Mood normal.           Assessment & Plan:   Problem List Items Addressed This Visit      Musculoskeletal and Integument   Osteoporosis - Primary    Reviewed recent dexa with lowest TS -2.7 in LFN  No fractures but has fallen (discussed fall prevention) taking ca and D Slim and caucasian and fam hx of OP with fx (mother) No steroids or ppis  Disc options for tx  Has had a drug holiday from evista-could  re start  Intol of oral bisphosponate (gi intol) -could consider reclast  prolia would be option if covered by ins Given handouts on OP and all of these medications to consider Nl ca and nl dentition-suspect she would do fine with any ts She will call back with her choice after reading and checking with ins cov

## 2020-05-06 NOTE — Patient Instructions (Signed)
You are a candidate for osteoporosis treatment to prevent broken bones  Be careful not to fall  Exercise if you can  Take your calcium and D   Look at the info on  evista reclast prolia    Let me know what you think  You can check on insurance coverage also

## 2020-05-12 ENCOUNTER — Other Ambulatory Visit: Payer: Self-pay | Admitting: Family Medicine

## 2020-05-12 ENCOUNTER — Telehealth: Payer: Self-pay | Admitting: Family Medicine

## 2020-05-12 MED ORDER — RALOXIFENE HCL 60 MG PO TABS: 60.0000 mg | ORAL_TABLET | Freq: Every day | ORAL | 3 refills | Status: DC

## 2020-05-12 NOTE — Telephone Encounter (Signed)
Last OV 05/06/20 Last fill 05/15/19  #90/3

## 2020-05-12 NOTE — Telephone Encounter (Signed)
Pt called she decided to go with evista generic for treatment for oseoporosis Her insurance will pay for 90 supply  She stated dr tower put her on estradiol in July and pt wants to know if she is to evista and estradiol together.  She stated she only uses cream a couple times of week   [facility] [address] [place] She only needs rx for evista   Pt stated years ago she took fosamax for bone density.  Is there any way you can tell her how long she took this meds.  Dentist ask her every time she goes   Please call pt

## 2020-05-12 NOTE — Telephone Encounter (Signed)
See prev message. I don't see fosamax on past med list at all, only med I see is evista was prescribed from 2012-2014

## 2020-05-12 NOTE — Telephone Encounter (Signed)
I sent evista   Since it was so many years ago- I cannot tell when the alendronate was  Not sure if she had side effects right away or was on it for longer   That may pre date the computer records

## 2020-05-13 NOTE — Telephone Encounter (Signed)
Before I call pt, one questing that pt had in prev message was  " She stated dr tower put her on estradiol in July and pt wants to know if she is to evista and estradiol together"  Please answer and then I will call pt back

## 2020-05-13 NOTE — Telephone Encounter (Signed)
It is fine to take the evista and estradiol cream together  Thanks

## 2020-05-13 NOTE — Telephone Encounter (Signed)
Pt notified of Dr. Tower's comments and Rx was sent to pharmacy  

## 2020-05-19 ENCOUNTER — Other Ambulatory Visit: Payer: Self-pay

## 2020-05-19 ENCOUNTER — Encounter: Payer: Self-pay | Admitting: Obstetrics & Gynecology

## 2020-05-19 ENCOUNTER — Ambulatory Visit (INDEPENDENT_AMBULATORY_CARE_PROVIDER_SITE_OTHER): Payer: Medicare HMO | Admitting: Obstetrics & Gynecology

## 2020-05-19 VITALS — BP 94/60 | HR 75 | Ht 62.0 in | Wt 114.6 lb

## 2020-05-19 DIAGNOSIS — N8110 Cystocele, unspecified: Secondary | ICD-10-CM

## 2020-05-19 DIAGNOSIS — N898 Other specified noninflammatory disorders of vagina: Secondary | ICD-10-CM | POA: Diagnosis not present

## 2020-05-19 MED ORDER — TRIAMCINOLONE ACETONIDE 0.5 % EX CREA: 1.0000 "application " | TOPICAL_CREAM | CUTANEOUS | 3 refills | Status: DC

## 2020-05-19 NOTE — Progress Notes (Signed)
   GYNECOLOGY OFFICE VISIT NOTE  History:   Tracy [name] is a 69 y.o. PMP female here today for follow up vaginal irritation and cystocele. Irritation/burning was alleviated by Triamcinolone creams, but it resumed after steroid course. Not to the same level it was but getting there.  Also worried about cystocele and wants Urogynecology referral. Concerned that cystocele bulge could be contributing to the burning feeling in her vulva.  She denies any abnormal vaginal discharge, bleeding, pelvic pain or other concerns.    Past Medical History:  Diagnosis Date  . Chronic fatigue syndrome   . Hyperlipidemia   . Osteopenia     Past Surgical History:  Procedure Laterality Date  . ABDOMINAL HYSTERECTOMY     one ovary removed/ bleeding  . CERVICAL POLYPECTOMY    . DILATION AND CURETTAGE OF UTERUS    . UNILATERAL SALPINGECTOMY Right     The following portions of the patient's history were reviewed and updated as appropriate: allergies, current medications, past family history, past medical history, past social history, past surgical history and problem list.   Health Maintenance:   Normal mammogram on 04/30/2020.   Review of Systems:  Pertinent items noted in HPI and remainder of comprehensive ROS otherwise negative.  Physical Exam:  BP 94/60   Pulse 75   Ht 5\' 2"  (1.575 m)   Wt 114 lb 9.6 oz (52 kg)   BMI 20.96 kg/m  CONSTITUTIONAL: Well-developed, well-nourished female in no acute distress.  HEENT:  Normocephalic, atraumatic. External right and left ear normal. No scleral icterus.  NECK: Normal range of motion, supple, no masses noted on observation SKIN: No rash noted. Not diaphoretic. No erythema. No pallor. MUSCULOSKELETAL: Normal range of motion. No edema noted. NEUROLOGIC: Alert and oriented to person, place, and time. Normal muscle tone coordination. No cranial nerve deficit noted. PSYCHIATRIC: Normal mood and affect. Normal behavior. Normal judgment and thought  content. CARDIOVASCULAR: Normal heart rate noted RESPIRATORY: Effort and breath sounds normal, no problems with respiration noted ABDOMEN: No masses noted. No other overt distention noted.   PELVIC: Deferred     Assessment and Plan:      1. Female cystocele - Ambulatory referral to Urogynecology done, they will contact her.  2. Vaginal irritation This was re-prescribed for patient for now, will evaluate response. Will also continue estrogen cream as prescribed by PCP. If worsens, may need further evaluation/management. - triamcinolone cream (KENALOG) 0.5 %; Apply 1 application topically every other day.  Dispense: 30 g; Refill: 3  Routine preventative health maintenance measures emphasized. Please refer to After Visit Summary for other counseling recommendations.   Return for any gynecologic concerns.     I spent 18 minutes dedicated to the care of this patient including pre-visit review of records, face to face time with the patient discussing her conditions and treatments and post visit ordering of testing.    Verita Schneiders, MD, [place] [facility] [employer] [facility], [place]

## 2020-05-28 ENCOUNTER — Other Ambulatory Visit: Payer: Self-pay | Admitting: *Deleted

## 2020-05-28 DIAGNOSIS — N8110 Cystocele, unspecified: Secondary | ICD-10-CM

## 2020-06-01 ENCOUNTER — Ambulatory Visit (INDEPENDENT_AMBULATORY_CARE_PROVIDER_SITE_OTHER): Payer: Medicare HMO | Admitting: Obstetrics and Gynecology

## 2020-06-01 ENCOUNTER — Encounter: Payer: Self-pay | Admitting: Obstetrics and Gynecology

## 2020-06-01 ENCOUNTER — Other Ambulatory Visit: Payer: Self-pay

## 2020-06-01 VITALS — BP 122/50 | Wt 113.5 lb

## 2020-06-01 DIAGNOSIS — M62838 Other muscle spasm: Secondary | ICD-10-CM | POA: Diagnosis not present

## 2020-06-01 DIAGNOSIS — R3914 Feeling of incomplete bladder emptying: Secondary | ICD-10-CM

## 2020-06-01 DIAGNOSIS — N952 Postmenopausal atrophic vaginitis: Secondary | ICD-10-CM

## 2020-06-01 DIAGNOSIS — N8110 Cystocele, unspecified: Secondary | ICD-10-CM | POA: Diagnosis not present

## 2020-06-01 LAB — POCT URINALYSIS DIPSTICK
Bilirubin, UA: NEGATIVE
Blood, UA: NEGATIVE
Glucose, UA: NEGATIVE
Ketones, UA: NEGATIVE
Leukocytes, UA: NEGATIVE
Nitrite, UA: NEGATIVE
Odor: NEGATIVE
Protein, UA: NEGATIVE
Spec Grav, UA: 1.02 (ref 1.010–1.025)
Urobilinogen, UA: 0.2 E.U./dL
pH, UA: 5 (ref 5.0–8.0)

## 2020-06-01 MED ORDER — DIAZEPAM 2 MG PO TABS: 2.0000 mg | ORAL_TABLET | Freq: Two times a day (BID) | ORAL | 0 refills | Status: DC | PRN

## 2020-06-01 NOTE — Patient Instructions (Addendum)
Women should try to eat at least 21 to 25 grams of fiber a day, while men should aim for 30 to 38 grams a day. If you stool gets too loose or liquid, you can add fiber to your diet with food or a fiber supplement such as psyllium (metamucil), benefiber, or fibercon.    I will prescribe valium 2 mg pills to place vaginally up to 2 times a day for vaginal muscle spasms. Start at night and take one every night for the next several weeks to see if it improves your symptoms. Once you are improving you can taper off the medication and just use as needed. If the medication makes you drowsy then only use at bedtime and/or we can reduce the dose. Do not use gel to place it, as that will prevent the tablet from dissolving.  Instead place 1-2 drops of water on the table before inserting it in the vagina. If the pills do not dissolve well we can switch to a special compounded suppository. Let me know how you are doing on the medication and if you have any questions.    Vulvovaginal moisturizer Options: Marland Kitchen Vitamin E oil (pump or capsule) or cream (Gene's Vit E Cream) . Coconut oil . Silicone-based lubricant for use during intercourse ("wet platinum" is a brand available at most drugstores) . Crisco Consider the ingredients of the product - the fewer the ingredients the better!  Directions for Use: Clean and dry your hands Gently dab the vulvar/vaginal area dry as needed Apply a "pea-sized" amount of the moisturizer onto your fingertip Using you other hand, open the labia  Apply the moisturizer to the vulvar/vaginal tissues Wear loose fitting underwear/clothing if possible following application Use moisturize up to 3 times daily as desired.

## 2020-06-01 NOTE — Progress Notes (Signed)
Tracy [name]  Referring Provider: Osborne Oman, MD PCP: Abner Greenspan, MD Date of Service: 06/01/2020  SUBJECTIVE Chief Complaint: New Patient (Initial Visit) (problem since July)  History of Present Illness: Tracy [name] is a 69 y.o. White or Caucasian female seen in [name] at the request of Dr. Harolyn Rutherford for evaluation of prolapse.    Review of records significant for: Having vaginal irritation and has history of cystocele. Currently on vaginal estrogen and triamcinolone cream.   Urinary Symptoms: Does not leak urine.   Day time voids 7-8.  Nocturia: 2-3 times per night to void. Voiding dysfunction: she does not empty her bladder well.  does not use a catheter to empty bladder.  When urinating, she feels a weak stream, difficulty starting urine stream and the need to urinate multiple times in a row  UTIs: 0 UTI's in the last year.   Denies history of blood in urine and kidney or bladder stones  Pelvic Organ Prolapse Symptoms:                  She Admits to a feeling of a bulge the vaginal area. It has been present for 4 months.  She Denies seeing a bulge.  This bulge is bothersome.  Bowel Symptom: Bowel movements: 1 time(s) per day Stool consistency: soft  Straining: no.  Splinting: no.  Incomplete evacuation: yes.  She Admits to accidental bowel leakage / fecal incontinence  Occurs: sporadically  Consistency with leakage: liquid Bowel regimen: none Last colonoscopy: Date 2015, Results negative  Sexual Function Sexually active: no.  Sexual orientation: Straight Pain with sex: Yes, at the vaginal opening, has discomfort due to dryness  Pelvic Pain Admits to pelvic pain Location: left side and lower abdomen Improved by: reclining Worsened by: sitting and using the bathroom  Has burning on the vulvar area. Has improved with the triamcinolone cream (using it every other day). Using the estrogen  cream twice a week.    Past Medical History:  Past Medical History:  Diagnosis Date  . Chronic fatigue syndrome   . Hyperlipidemia   . Osteopenia      Past Surgical History:   Past Surgical History:  Procedure Laterality Date  . ABDOMINAL HYSTERECTOMY     one ovary removed/ bleeding  . CERVICAL POLYPECTOMY    . DILATION AND CURETTAGE OF UTERUS    . UNILATERAL SALPINGECTOMY Right      Past OB/GYN History:  Menopausal: Yes, s/p hysterectomy   Medications: She has a current medication list which includes the following prescription(s): calcium-vitamin d-vitamin k, vitamin d, cyanocobalamin, estradiol, raloxifene, simvastatin, triamcinolone cream, and diazepam.   Allergies: Patient is allergic to alendronate sodium, amoxicillin, ibuprofen, and ivp dye [iodinated diagnostic agents].   Social History:  Social History   Tobacco Use  . Smoking status: Never Smoker  . Smokeless tobacco: Never Used  Vaping Use  . Vaping Use: Never used  Substance Use Topics  . Alcohol use: No    Alcohol/week: 0.0 standard drinks  . Drug use: No    Relationship status: married She lives with husband.   Regular exercise: No   Family History:   Family History  Problem Relation Age of Onset  . Stroke Mother   . Hypertension Father   . Stroke Father   . Hyperlipidemia Sister   . Hyperlipidemia Brother   . Cancer Paternal Uncle   . Cancer Maternal Grandmother        intestinal  CA  . Cancer Paternal Grandfather        CA unsure which kind  . Hyperlipidemia Brother   . Heart attack Sister        Thought to be silent MI  . Breast cancer Neg Hx      Review of Systems: Review of Systems  Constitutional: Positive for malaise/fatigue. Negative for fever and weight loss.  Respiratory: Negative for cough, shortness of breath and wheezing.   Cardiovascular: Negative for chest pain, palpitations and leg swelling.  Gastrointestinal: Positive for abdominal pain. Negative for blood in  stool.  Genitourinary: Negative for dysuria.  Musculoskeletal: Negative for myalgias.  Skin: Negative for rash.  Neurological: Negative for dizziness and headaches.  Endo/Heme/Allergies: Does not bruise/bleed easily.  Psychiatric/Behavioral: Negative for depression. The patient is not nervous/anxious.      OBJECTIVE Physical Exam: Vitals:   06/01/20 1612  BP: (!) 122/50  Weight: 113 lb 8 oz (51.5 kg)    Physical Exam Constitutional:      General: She is not in acute distress. Pulmonary:     Effort: Pulmonary effort is normal.  Abdominal:     General: There is no distension.     Palpations: Abdomen is soft.     Tenderness: There is no abdominal tenderness. There is no rebound.  Musculoskeletal:        General: No swelling. Normal range of motion.  Skin:    General: Skin is warm and dry.     Findings: No rash.  Neurological:     Mental Status: She is alert and oriented to person, place, and time.  Psychiatric:        Mood and Affect: Mood normal.        Behavior: Behavior normal.      GU / Detailed Urogynecologic Evaluation:  Pelvic Exam: Normal external female genitalia with erythema along the labia majora; Bartholin's and Skene's glands normal in appearance; urethral meatus normal in appearance, no urethral masses or discharge.   CST: negative  Qtip: slight tenderness around vestibule s/p hysterectomy: Speculum exam reveals normal vaginal mucosa with  atrophy and normal vaginal cuff.  Adnexa no mass, fullness, tenderness.    Pelvic floor strength: unable to demonstrate kegel  Pelvic floor musculature: Right levator tender, Right obturator tender, Left levator tender, Left obturator tender  POP-Q:   POP-Q  -1.5                                            Aa   -1.5                                           Ba  -5                                              C   2                                            Gh  3.5  Pb  6                                            tvl   -3                                            Ap  -3                                            Bp   (n/a)                                              D     Rectal Exam:  Normal external rectum   Post-Void Residual (PVR) by Bladder Scan: In order to evaluate bladder emptying, we discussed obtaining a postvoid residual and she agreed to this procedure.  Procedure: The ultrasound unit was placed on the patient's abdomen in the suprapubic region after the patient had voided. A PVR of 3 ml was obtained by bladder scan.  Laboratory Results: POC urine: negative  I visualized the urine specimen, noting the specimen to be clear yellow  ASSESSMENT AND PLAN Ms. Torain is a 69 y.o. with:  1. Levator spasm   2. Vaginal atrophy   3. Prolapse of anterior vaginal wall   4. Feeling of incomplete bladder emptying     1. Levator spasm The origin of pelvic floor muscle spasm can be multifactorial, including primary, reactive to a different pain source, trauma, or even part of a centralized pain syndrome.Treatment options include pelvic floor physical therapy, local (vaginal) or oral  muscle relaxants, or centrally acting pain medications.   - Palpation of levators vaginally reproduced pelvic pain.  - She would like a referral to pelvic floor physical therapy in [place]- referral placed. Also prescribed Valium 2mg  tablets to place vaginally nightly as needed for pain.   2. Vaginal atrophy - she should continue the vaginal estrogen twice weekly, can also place inside of the vagina.  - Advised to use additional vaginal moisturizer such as coconut oil to the labia as needed for irritation.   3. Stage II anterior, Stage I posterior, Stage I apical prolapse For treatment of pelvic organ prolapse, we discussed options for management including expectant management, conservative management, and surgical management, such as Kegels, a pessary,  pelvic floor physical therapy, and specific surgical procedures. - She will pursue physical therapy. Her most bothersome symptom is currently her pain and we discussed that the prolapse is likely not the cause.   4. Feeling of incomplete bladder emptying - POC urine negative for infection. Emptied bladder well today, normal PVR.  - Reviewed that tight pelvic floor muscles can inhibit bladder from emptying well, so physical therapy can also help with this sensation.   Return 2-3 months to follow up on symptoms.    [name], MD   Medical Decision Making:  - Reviewed/ ordered a clinical laboratory test - Review and summation of prior records - Independent review of urine specimen

## 2020-06-17 ENCOUNTER — Ambulatory Visit: Payer: Medicare HMO | Attending: Obstetrics and Gynecology | Admitting: Physical Therapy

## 2020-06-17 ENCOUNTER — Encounter: Payer: Self-pay | Admitting: Physical Therapy

## 2020-06-17 ENCOUNTER — Other Ambulatory Visit: Payer: Self-pay

## 2020-06-17 DIAGNOSIS — R2689 Other abnormalities of gait and mobility: Secondary | ICD-10-CM | POA: Diagnosis not present

## 2020-06-17 DIAGNOSIS — M2170 Unequal limb length (acquired), unspecified site: Secondary | ICD-10-CM | POA: Diagnosis not present

## 2020-06-17 DIAGNOSIS — M4125 Other idiopathic scoliosis, thoracolumbar region: Secondary | ICD-10-CM

## 2020-06-17 DIAGNOSIS — M6281 Muscle weakness (generalized): Secondary | ICD-10-CM | POA: Diagnosis not present

## 2020-06-17 NOTE — Patient Instructions (Addendum)
Body mechanics:   Avoid straining pelvic floor, abdominal muscles , spine   1) Sitting with feet on the ground    2) Use log rolling technique instead of getting out of bed with your neck or the sit-up     Log rolling into and out of bed   Log rolling into and out of bed If getting out of bed on R side, Bent knees, scoot hips/ shoulder to L  Raise R arm completely overhead, rolling onto armpit  Then lower bent knees to bed to get into complete side lying position  Then drop legs off bed, and push up onto R elbow/forearm, and use L hand to push onto the bed    Improve bladder and bowel health   3) increase room temp water from 16 fl oz   First thing in the morning 8 fl oz  Lunch 16 oz  Afternoon / dinner  16 fl oz   40 fl oz   TOTAL     ___ Increase hip strength  4) Clam Shell 45 Degrees  Lying with hips and knees bent 45, one pillow between knees and ankles. Heel together, toes apart like ballerina,  Lift knee with exhale while pressing heels together. Be sure pelvis does not roll backward. Do not arch back. Do 20 times, each leg, 2 times per day.     Complimentary stretch: Figure-4     _  Balance out leg length   Wear shoe lifts in R shoe
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
## 2020-06-23 ENCOUNTER — Encounter: Payer: Medicare HMO | Admitting: Physical Therapy

## 2020-07-02 ENCOUNTER — Other Ambulatory Visit: Payer: Self-pay

## 2020-07-02 ENCOUNTER — Ambulatory Visit: Payer: Medicare HMO | Attending: Obstetrics and Gynecology | Admitting: Physical Therapy

## 2020-07-02 DIAGNOSIS — M533 Sacrococcygeal disorders, not elsewhere classified: Secondary | ICD-10-CM | POA: Diagnosis not present

## 2020-07-02 DIAGNOSIS — R2689 Other abnormalities of gait and mobility: Secondary | ICD-10-CM | POA: Diagnosis present

## 2020-07-02 DIAGNOSIS — M6281 Muscle weakness (generalized): Secondary | ICD-10-CM | POA: Diagnosis present

## 2020-07-02 DIAGNOSIS — M2170 Unequal limb length (acquired), unspecified site: Secondary | ICD-10-CM | POA: Insufficient documentation

## 2020-07-02 DIAGNOSIS — M4125 Other idiopathic scoliosis, thoracolumbar region: Secondary | ICD-10-CM | POA: Insufficient documentation

## 2020-07-02 NOTE — Patient Instructions (Addendum)
Single heel raises with hand on counter Spread toes and ballmounds  Look straight ahead   10 reps x 3 different times in the day Switch leg   __  Lengthen Back rib by R  shoulder only  "Winging"   Lie on ZL   side , pillow between knees  Pull R  arm overhead over mattress, grab the edge of mattress,pull it upward, drawing elbow away from ears  Breathing   Open book (handout)  Lying on  _ side , rotating  __ only this week  BOTH sides   ___

## 2020-07-02 NOTE — Therapy (Signed)
[place] MAIN [facility] SERVICES [address] [place], [place], [address] Phone: [phone]   Fax:  [phone]  Physical Therapy Treatment  Patient Details  Name: Tracy [name] MRN: [id number] Date of Birth: 1951-02-07 Referring Provider (PT): Wannetta Sender    Encounter Date: 07/02/2020   PT End of Session - 07/02/20 1455    Visit Number 2    Number of Visits 10    Date for PT Re-Evaluation 08/26/20    PT Start Time 1400    PT Stop Time 1500    PT Time Calculation (min) 60 min           Past Medical History:  Diagnosis Date  . Chronic fatigue syndrome   . Hyperlipidemia   . Osteoporosis     Past Surgical History:  Procedure Laterality Date  . ABDOMINAL HYSTERECTOMY     one ovary removed/ bleeding  . CERVICAL POLYPECTOMY    . DILATION AND CURETTAGE OF UTERUS    . UNILATERAL SALPINGECTOMY Right     There were no vitals filed for this visit.   Subjective Assessment - 07/02/20 1414    Subjective Pt reports the R shoe lift has not given her any problems. Pt has increased her water intake to 40 fl oz but has to go the bathroom. her bowel movements now occur daily. She thinks her increased water is helping with her bowel movements.  Pt notices pain with urination and bowel movements.  Pt also reports being able to lie on her back more comfortably.    Pertinent History Scoliosis, Chronic Fatigue Syndrome,  Physical Activity:  no walking routine.  Hobbies: reading, listen to music.  Daily fluid intake: water 16 fl oz, 1 cup of coffee, 1 cup of tea in evening.  Bowel mvoements occur daily but last couple of weeks, every couple of days.    Patient Stated Goals have no pain              [facility] PT Assessment - 07/02/20 1420      Strength   Overall Strength Comments hip flex/knee flex 4+/5 B, knee flex R 3/5 B, DF/EV 4+/5 B, Standing PF R 10eps, 4/5 , L reps, 4/5  , hip abdR 5      Palpation   Spinal mobility sidebend/ rotation [facility]  without pubic pain    SI assessment  levelled iliac rest    Palpation comment increased tightness at thoracic region, hypmobile T7-10, scalenes, upper trap B      Ambulation/Gait   Gait velocity 1.17 m/s , more reciporcal thorax/ pelvis but R posterior rotation of thorax was limited                         [facility] Adult PT Treatment/Exercise - 07/02/20 1420      Therapeutic Activites    Therapeutic Activities --   explained POC, pelvic floor functions     Neuro Re-ed    Neuro Re-ed Details  cued for proper clam technique, new HEP      Modalities   Modalities --   5     Moist Heat Therapy   Number Minutes Moist Heat 5 Minutes    Moist Heat Location --   thoracic     Manual Therapy   Manual therapy comments STm/MWM to address increased tightness at thoracic region, hypmobile T7-10, scalenes, upper trap B  PT Long Term Goals - 06/17/20 1231      PT LONG TERM GOAL #1   Title Pt will report not having to move seatbelt away from L hip across 3 car rides in order to minimize L pubic/ hip pain and to travel    Time 6    Period Weeks    Status New    Target Date 07/29/20      PT LONG TERM GOAL #2   Title Pt will decrease her Pelvic pain on FOTO from 46 pts to < 36 pts,  PFDI Urinary 21pts to < 11 pts, PFDI Prolapse 33 pts to < 23 pts in order to restore QOL and ADLs   ( reassessment:  Pain    , Urinary      ,  Prolapse    )    Time 10    Period Weeks    Status New    Target Date 08/26/20      PT LONG TERM GOAL #3   Title Pt will be IND with scoliosis specific HEP in order to minimize worsening of mm/ spinal/ pelvic imbalances and minimize risk for injuries    Time 10    Period Weeks    Status New    Target Date 08/26/20      PT LONG TERM GOAL #4   Title Pt will demo IND with deep core and pelvic floor coordination in order to progress to strengtening    Time 2    Period Weeks    Status New    Target Date 07/09/20      PT  LONG TERM GOAL #5   Title Pt will report daily bowel movements ( previously occuring every 3-4 days) in order to restore pelvic floor function    Time 8    Period Weeks    Status New    Target Date 08/13/20      Additional Long Term Goals   Additional Long Term Goals Yes      PT LONG TERM GOAL #6   Title Pt will report no pubic pain with radiating L hip pain across 2 weeks in order to walk and perform ADLs and minimize further narrowing of hips/ minimize risk for Fx 2/2 osteoposis, minimize worsening of scoliosis    Time 6    Period Weeks    Status New    Target Date 07/30/20      PT LONG TERM GOAL #7   Title Pt will report she is able to get to the bathroom bvefore leakage 100% of the time in order to be out in the community    Time 8    Period Weeks    Status New    Target Date 08/13/20                 Plan - 07/02/20 1457    Clinical Impression Statement Pt demo'd equal iliac crest alignment and improved gait speed since last session with wear of shoe lift. Pt remains compliant with increased water in take which has also helped with improving her BMs regularity.   Pt reported no pubic pain with L sideflexion today which indicates improvement. Pt demo'd limited trunk rotation R. Thus, addressed today the rotational component of thorax related to scoliosis with manual Tx which pt tolerated without complaints. Plan to provide scoliosis HEP to minimize imbalances structurally which will help yield better outcomes for pelvic floor Tx and improvements.    Pt continues to benefit from  skilled PT    Examination-Activity Limitations Toileting;Sit    Stability/Clinical Decision Making Evolving/Moderate complexity    Rehab Potential Good    PT Frequency 1x / week    PT Duration Other (comment)   10   PT Treatment/Interventions Neuromuscular re-education;Stair training;Moist Heat;Therapeutic activities;Patient/family education;Therapeutic exercise;Cryotherapy;Manual  techniques;Taping;Scar mobilization    Consulted and Agree with Plan of Care Patient           Patient will benefit from skilled therapeutic intervention in order to improve the following deficits and impairments:  Abnormal gait,Decreased mobility,Decreased coordination,Decreased endurance,Decreased activity tolerance,Decreased balance,Decreased strength,Increased muscle spasms,Decreased scar mobility,Postural dysfunction  Visit Diagnosis: Unequal leg length  Other idiopathic scoliosis, thoracolumbar region  Muscle weakness (generalized)  Other abnormalities of gait and mobility     Problem List Patient Active Problem List   Diagnosis Date Noted  . Vaginal irritation 04/15/2020  . Pelvic pain 04/15/2020  . Bilateral groin pain 03/17/2020  . Angiomyolipoma of left kidney 03/11/2020  . Left lower quadrant abdominal pain 03/04/2020  . Yeast cells and fungal elements present on diagnostic testing 02/11/2020  . Welcome to Medicare preventive visit 03/03/2017  . Estrogen deficiency 03/03/2017  . Borderline low blood pressure determined by examination 10/31/2016  . Vitamin B12 deficiency 10/31/2016  . Chest pain with moderate risk for cardiac etiology 10/17/2014  . Colon cancer screening 02/17/2014  . Other screening mammogram 02/07/2011  . Routine general medical examination at a health care facility 02/01/2011  . Vitamin D deficiency 04/14/2008  . Osteoporosis 01/09/2007  . Hyperlipidemia with target LDL less than 130 01/04/2007  . Atrophic vaginitis 01/04/2007  . SCOLIOSIS 01/04/2007  . CHRONIC FATIGUE SYNDROME 01/04/2007    Jerl Mina ,PT, DPT, E-RYT  07/02/2020, 4:51 PM  [place] MAIN [facility] SERVICES [address] [place], [place], [address] Phone: [phone]   Fax:  [phone]  Name: Tracy [name] MRN: [id number] Date of Birth: 10-Jul-1951

## 2020-07-02 NOTE — Addendum Note (Signed)
Addended by: Jerl Mina on: 07/02/2020 10:01 AM   Modules accepted: Orders

## 2020-07-02 NOTE — Addendum Note (Signed)
Addended by: Jerl Mina on: 07/02/2020 09:33 AM   Modules accepted: Orders

## 2020-07-10 ENCOUNTER — Other Ambulatory Visit: Payer: Self-pay

## 2020-07-10 ENCOUNTER — Ambulatory Visit: Payer: Medicare HMO | Admitting: Physical Therapy

## 2020-07-10 DIAGNOSIS — M4125 Other idiopathic scoliosis, thoracolumbar region: Secondary | ICD-10-CM | POA: Diagnosis not present

## 2020-07-10 DIAGNOSIS — M2170 Unequal limb length (acquired), unspecified site: Secondary | ICD-10-CM | POA: Diagnosis not present

## 2020-07-10 DIAGNOSIS — M6281 Muscle weakness (generalized): Secondary | ICD-10-CM

## 2020-07-10 DIAGNOSIS — R2689 Other abnormalities of gait and mobility: Secondary | ICD-10-CM | POA: Diagnosis not present

## 2020-07-10 DIAGNOSIS — M533 Sacrococcygeal disorders, not elsewhere classified: Secondary | ICD-10-CM | POA: Diagnosis not present

## 2020-07-10 NOTE — Patient Instructions (Addendum)
Abdominal massage upward from L,  R , center to belly button 3 stroke x 3 , pressure is gentle and light with all fingers flat, not using finger tips  1 x day  __  Scoliosis exercise to adjust to the shift of ribcage and make space at R flank  R hand on wall at the height of below shoulder , stand perpendicular Lean towards the wall and push away  20 reps x 3 x day   ___  Pillow between legs when sidelying

## 2020-07-10 NOTE — Therapy (Signed)
[place] MAIN [facility] SERVICES [address] [place], [place], [address] Phone: [phone]   Fax:  [phone]  Physical Therapy Treatment  Patient Details  Name: Tracy [name] MRN: [id number] Date of Birth: 06-Mar-1951 Referring Provider (PT): Wannetta Sender    Encounter Date: 07/10/2020   PT End of Session - 07/10/20 1108    Visit Number 3    Number of Visits 10    Date for PT Re-Evaluation 08/26/20    PT Start Time 1102    PT Stop Time 1200    PT Time Calculation (min) 58 min           Past Medical History:  Diagnosis Date  . Chronic fatigue syndrome   . Hyperlipidemia   . Osteoporosis     Past Surgical History:  Procedure Laterality Date  . ABDOMINAL HYSTERECTOMY     one ovary removed/ bleeding  . CERVICAL POLYPECTOMY    . DILATION AND CURETTAGE OF UTERUS    . UNILATERAL SALPINGECTOMY Right     There were no vitals filed for this visit.   Subjective Assessment - 07/10/20 1108    Subjective Pt reports the pelvic pain occurs in the front pubic area after she uses the bathroom and with sitting. Currently 4-5/10.    Pertinent History Scoliosis, Chronic Fatigue Syndrome,  Physical Activity:  no walking routine.  Hobbies: reading, listen to music.  Daily fluid intake: water 16 fl oz, 1 cup of coffee, 1 cup of tea in evening.  Bowel mvoements occur daily but last couple of weeks, every couple of days.    Patient Stated Goals have no pain              [facility] PT Assessment - 07/10/20 1110      Observation/Other Assessments   Scoliosis levelled pelvic girdle, L shoulder lower, 3 fingers width L flank between low rib/iliac crest, no fingers width R      Palpation   Patella mobility increased hypomobility T4, interspinal mm tightness along R and sacpular attachments, increased abdominal scar restrictions    Palpation comment palpation at pubic symphysis and L rollling on plinth caused flinching pain, ( post Tx: 2/10)       Ambulation/Gait   Gait Comments R thorax shift, excessive L pelvic shift,                         [facility] Adult PT Treatment/Exercise - 07/10/20 1110      Neuro Re-ed    Neuro Re-ed Details  cued for new HEP      Moist Heat Therapy   Number Minutes Moist Heat 5 Minutes    Moist Heat Location --   pubic and thoracic area     Manual Therapy   Manual therapy comments STM/MWM to address increased hypomobility T4, interspinal mm tightness along R and sacpular attachments, increased abdominal scar restrictions                       PT Long Term Goals - 06/17/20 1231      PT LONG TERM GOAL #1   Title Pt will report not having to move seatbelt away from L hip across 3 car rides in order to minimize L pubic/ hip pain and to travel    Time 6    Period Weeks    Status New    Target Date 07/29/20      PT LONG TERM  GOAL #2   Title Pt will decrease her Pelvic pain on FOTO from 46 pts to < 36 pts,  PFDI Urinary 21pts to < 11 pts, PFDI Prolapse 33 pts to < 23 pts in order to restore QOL and ADLs   ( reassessment:  Pain    , Urinary      ,  Prolapse    )    Time 10    Period Weeks    Status New    Target Date 08/26/20      PT LONG TERM GOAL #3   Title Pt will be IND with scoliosis specific HEP in order to minimize worsening of mm/ spinal/ pelvic imbalances and minimize risk for injuries    Time 10    Period Weeks    Status New    Target Date 08/26/20      PT LONG TERM GOAL #4   Title Pt will demo IND with deep core and pelvic floor coordination in order to progress to strengtening    Time 2    Period Weeks    Status New    Target Date 07/09/20      PT LONG TERM GOAL #5   Title Pt will report daily bowel movements ( previously occuring every 3-4 days) in order to restore pelvic floor function    Time 8    Period Weeks    Status New    Target Date 08/13/20      Additional Long Term Goals   Additional Long Term Goals Yes      PT LONG TERM GOAL #6    Title Pt will report no pubic pain with radiating L hip pain across 2 weeks in order to walk and perform ADLs and minimize further narrowing of hips/ minimize risk for Fx 2/2 osteoposis, minimize worsening of scoliosis    Time 6    Period Weeks    Status New    Target Date 07/30/20      PT LONG TERM GOAL #7   Title Pt will report she is able to get to the bathroom bvefore leakage 100% of the time in order to be out in the community    Time 8    Period Weeks    Status New    Target Date 08/13/20                 Plan - 07/10/20 1108    Clinical Impression Statement Pt continues to good carry over with levelled pelvic girdle but spinal deviations from scoliosis are still present. Manual Tx addressed R thorax shift which pt tolerated and demo'd increased flank length between low rib and iliac crest on R post Tx. Added scoliosis specific HEP to maintain this improvement. Pt requried cues for technique. Also added manual Tx to minimize abdominal scar restrictions . Following today's Tx, pt reported decreased pubic pain by 50%.  Plan to continue addressing scoliosis and perform pelvic floor assessment externally at upcoming sessions    Examination-Activity Limitations Toileting;Sit    Stability/Clinical Decision Making Evolving/Moderate complexity    Rehab Potential Good    PT Frequency 1x / week    PT Duration Other (comment)   10   PT Treatment/Interventions Neuromuscular re-education;Stair training;Moist Heat;Therapeutic activities;Patient/family education;Therapeutic exercise;Cryotherapy;Manual techniques;Taping;Scar mobilization    Consulted and Agree with Plan of Care Patient           Patient will benefit from skilled therapeutic intervention in order to improve the following deficits and impairments:  Abnormal gait,Decreased mobility,Decreased coordination,Decreased endurance,Decreased activity tolerance,Decreased balance,Decreased strength,Increased muscle spasms,Decreased scar  mobility,Postural dysfunction  Visit Diagnosis: Unequal leg length  Other idiopathic scoliosis, thoracolumbar region  Muscle weakness (generalized)  Other abnormalities of gait and mobility     Problem List Patient Active Problem List   Diagnosis Date Noted  . Vaginal irritation 04/15/2020  . Pelvic pain 04/15/2020  . Bilateral groin pain 03/17/2020  . Angiomyolipoma of left kidney 03/11/2020  . Left lower quadrant abdominal pain 03/04/2020  . Yeast cells and fungal elements present on diagnostic testing 02/11/2020  . Welcome to Medicare preventive visit 03/03/2017  . Estrogen deficiency 03/03/2017  . Borderline low blood pressure determined by examination 10/31/2016  . Vitamin B12 deficiency 10/31/2016  . Chest pain with moderate risk for cardiac etiology 10/17/2014  . Colon cancer screening 02/17/2014  . Other screening mammogram 02/07/2011  . Routine general medical examination at a health care facility 02/01/2011  . Vitamin D deficiency 04/14/2008  . Osteoporosis 01/09/2007  . Hyperlipidemia with target LDL less than 130 01/04/2007  . Atrophic vaginitis 01/04/2007  . SCOLIOSIS 01/04/2007  . CHRONIC FATIGUE SYNDROME 01/04/2007    Jerl Mina ,PT, DPT, E-RYT  07/10/2020, 12:22 PM  [place] MAIN [facility] SERVICES [address] [place], [place], [address] Phone: [phone]   Fax:  [phone]  Name: Kendy Haston MRN: [id number] Date of Birth: May 12, 1951

## 2020-07-16 ENCOUNTER — Ambulatory Visit: Payer: Medicare HMO | Admitting: Physical Therapy

## 2020-07-16 ENCOUNTER — Other Ambulatory Visit: Payer: Self-pay

## 2020-07-16 DIAGNOSIS — M4125 Other idiopathic scoliosis, thoracolumbar region: Secondary | ICD-10-CM

## 2020-07-16 DIAGNOSIS — R2689 Other abnormalities of gait and mobility: Secondary | ICD-10-CM | POA: Diagnosis not present

## 2020-07-16 DIAGNOSIS — M6281 Muscle weakness (generalized): Secondary | ICD-10-CM | POA: Diagnosis not present

## 2020-07-16 DIAGNOSIS — M2170 Unequal limb length (acquired), unspecified site: Secondary | ICD-10-CM | POA: Diagnosis not present

## 2020-07-16 DIAGNOSIS — M533 Sacrococcygeal disorders, not elsewhere classified: Secondary | ICD-10-CM | POA: Diagnosis not present

## 2020-07-16 NOTE — Patient Instructions (Addendum)
Stretch for pelvic floor   V- slides  "v heels slide away and then back toward buttocks and then rock knee to slight ,  slide heel along at 11 o clock away from buttocks   10 reps   __ Applying Pelvic tilts:  Finding a comfortable position when laying on your back  Laying on your back, lift hips up, then scoot tail under, lowering ribs / midback first, then the low back Press into the feet  ____  Deep core level 1 ( 10 breaths) ( handout)   Feet pressed, knee hip width apart not together which tighten pelvic floor  Inhale, smell soup, not chest breathing Lengthen pelvic floor down like soap bowl   ___  Vitamin E oil on labia     __ Switch to cotton underwear inside of polyester/ nylon

## 2020-07-16 NOTE — Therapy (Signed)
[place] MAIN [facility] SERVICES [address] [place], [place], [address] Phone: [phone]   Fax:  [phone]  Physical Therapy Treatment  Patient Details  Name: Tracy [name] MRN: [id number] Date of Birth: [birth year] Referring Provider (PT): Wannetta Sender    Encounter Date: 07/16/2020   PT End of Session - 07/16/20 1706    Visit Number 4    Number of Visits 10    Date for PT Re-Evaluation 08/26/20    PT Start Time 1500    PT Stop Time 1603    PT Time Calculation (min) 63 min           Past Medical History:  Diagnosis Date   Chronic fatigue syndrome    Hyperlipidemia    Osteoporosis     Past Surgical History:  Procedure Laterality Date   ABDOMINAL HYSTERECTOMY     one ovary removed/ bleeding   CERVICAL POLYPECTOMY     DILATION AND CURETTAGE OF UTERUS     UNILATERAL SALPINGECTOMY Right     There were no vitals filed for this visit.   Subjective Assessment - 07/16/20 1510    Subjective Pt felt her pelvic pain better for 50% improvement for one day. Pelvic pain hurts when she sits down and standing for a while. Pt has had to take Tylenol.  Pt reported the burning sensation started the day before she came in last week and it is very uncomfortable. She started using a steriod cream a gynecologist. Today, burning is still present.    Pertinent History Scoliosis, Chronic Fatigue Syndrome,  Physical Activity:  no walking routine.  Hobbies: reading, listen to music.  Daily fluid intake: water 16 fl oz, 1 cup of coffee, 1 cup of tea in evening.  Bowel mvoements occur daily but last couple of weeks, every couple of days.    Patient Stated Goals have no pain              [facility] PT Assessment - 07/16/20 1720      Palpation   Palpation comment tenderness along supra pubic area                      Pelvic Floor Special Questions - 07/16/20 1711    External Perineal Exam noted redness/irritation along labia              [facility] Adult PT Treatment/Exercise - 07/16/20 1709      Therapeutic Activites    Therapeutic Activities --   discussed using Vit E , cotton underwear to address     Neuro Re-ed    Neuro Re-ed Details  cued for pelvic floor stretches and relaxation breathing      Moist Heat Therapy   Number Minutes Moist Heat 5 Minutes    Moist Heat Location --   perineum     Manual Therapy   Manual therapy comments STM/MWM at pelvic floor mm, suprapubic area to promote pelvic floor lengthening                       PT Long Term Goals - 07/16/20 1721      PT LONG TERM GOAL #1   Title Pt will report not having to move seatbelt away from L hip across 3 car rides in order to minimize L pubic/ hip pain and to travel    Time 6    Period Weeks    Status On-going  PT LONG TERM GOAL #2   Title Pt will decrease her Pelvic pain on FOTO from 46 pts to < 36 pts,  PFDI Urinary 21pts to < 11 pts, PFDI Prolapse 33 pts to < 23 pts in order to restore QOL and ADLs   ( reassessment:  Pain    , Urinary      ,  Prolapse    )    Time 10    Period Weeks    Status On-going      PT LONG TERM GOAL #3   Title Pt will be IND with scoliosis specific HEP in order to minimize worsening of mm/ spinal/ pelvic imbalances and minimize risk for injuries    Time 10    Period Weeks    Status Partially Met      PT LONG TERM GOAL #4   Title Pt will demo IND with deep core and pelvic floor coordination in order to progress to strengtening    Time 2    Period Weeks    Status On-going      PT LONG TERM GOAL #5   Title Pt will report daily bowel movements ( previously occuring every 3-4 days) in order to restore pelvic floor function    Time 8    Period Weeks    Status On-going      PT LONG TERM GOAL #6   Title Pt will report no pubic pain with radiating L hip pain across 2 weeks in order to walk and perform ADLs and minimize further narrowing of hips/ minimize risk for Fx 2/2 osteoposis, minimize  worsening of scoliosis    Time 6    Period Weeks    Status On-going      PT LONG TERM GOAL #7   Title Pt will report she is able to get to the bathroom bvefore leakage 100% of the time in order to be out in the community    Time 8    Period Weeks    Status On-going      PT LONG TERM GOAL #8   Title Pt will demo no pelvic floor mm tightness and proper lengthening and upward movement of pelvic floor with deep core mm in order to minmize worsening of prolapse, burning, and pain and improve QOL    Time 8    Period Weeks    Status New    Target Date 09/10/20                 Plan - 07/16/20 1708    Clinical Impression Statement Pt demo'd equal alignment of pelvis and less spinal curves today. External pelvic floor assessment showed skin irritation/ redness and pt was educated to apply Vitamin E oil on labia and to switch from polyester underwear to cotton . Pt also had tight and tenderness with palpation at pelvic floor mm which improved with external manual Tx and coordinated breathing training and stretching techniques. Pt reported less pain at pubic area post Tx. Burning sensation remained unchanged. Pt continues to benefit from skilled PT.    Examination-Activity Limitations Toileting;Sit    Stability/Clinical Decision Making Evolving/Moderate complexity    Rehab Potential Good    PT Frequency 1x / week    PT Duration Other (comment)   10   PT Treatment/Interventions Neuromuscular re-education;Stair training;Moist Heat;Therapeutic activities;Patient/family education;Therapeutic exercise;Cryotherapy;Manual techniques;Taping;Scar mobilization    Consulted and Agree with Plan of Care Patient           Patient will benefit  from skilled therapeutic intervention in order to improve the following deficits and impairments:  Abnormal gait,Decreased mobility,Decreased coordination,Decreased endurance,Decreased activity tolerance,Decreased balance,Decreased strength,Increased muscle  spasms,Decreased scar mobility,Postural dysfunction  Visit Diagnosis: No diagnosis found.     Problem List Patient Active Problem List   Diagnosis Date Noted   Vaginal irritation 04/15/2020   Pelvic pain 04/15/2020   Bilateral groin pain 03/17/2020   Angiomyolipoma of left kidney 03/11/2020   Left lower quadrant abdominal pain 03/04/2020   Yeast cells and fungal elements present on diagnostic testing 02/11/2020   Welcome to Medicare preventive visit 03/03/2017   Estrogen deficiency 03/03/2017   Borderline low blood pressure determined by examination 10/31/2016   Vitamin B12 deficiency 10/31/2016   Chest pain with moderate risk for cardiac etiology 10/17/2014   Colon cancer screening 02/17/2014   Other screening mammogram 02/07/2011   Routine general medical examination at a health care facility 02/01/2011   Vitamin D deficiency 04/14/2008   Osteoporosis 01/09/2007   Hyperlipidemia with target LDL less than 130 01/04/2007   Atrophic vaginitis 01/04/2007   SCOLIOSIS 01/04/2007   CHRONIC FATIGUE SYNDROME 01/04/2007    Jerl Mina ,PT, DPT, E-RYT  07/16/2020, 5:22 PM  [place] MAIN [facility] SERVICES [address] [place], [place], [address] Phone: [phone]   Fax:  [phone]  Name: Tracy [name] MRN: [id number] Date of Birth: 01-Dec-1950

## 2020-07-21 ENCOUNTER — Other Ambulatory Visit: Payer: Self-pay

## 2020-07-21 ENCOUNTER — Ambulatory Visit: Payer: Medicare HMO | Admitting: Physical Therapy

## 2020-07-21 DIAGNOSIS — M6281 Muscle weakness (generalized): Secondary | ICD-10-CM | POA: Diagnosis not present

## 2020-07-21 DIAGNOSIS — M4125 Other idiopathic scoliosis, thoracolumbar region: Secondary | ICD-10-CM | POA: Diagnosis not present

## 2020-07-21 DIAGNOSIS — M2170 Unequal limb length (acquired), unspecified site: Secondary | ICD-10-CM | POA: Diagnosis not present

## 2020-07-21 DIAGNOSIS — R2689 Other abnormalities of gait and mobility: Secondary | ICD-10-CM | POA: Diagnosis not present

## 2020-07-21 DIAGNOSIS — M533 Sacrococcygeal disorders, not elsewhere classified: Secondary | ICD-10-CM | POA: Diagnosis not present

## 2020-07-21 NOTE — Therapy (Signed)
[place] MAIN [facility] SERVICES [address] [place], [place], [address] Phone: [phone]   Fax:  [phone]  Physical Therapy Treatment  Patient Details  Name: Tracy [name] MRN: [id number] Date of Birth: 07/01/1951 Referring Provider (PT): Wannetta Sender    Encounter Date: 07/21/2020   PT End of Session - 07/21/20 1358    Visit Number 5    Number of Visits 10    Date for PT Re-Evaluation 08/26/20    PT Start Time 1300    PT Stop Time 1357    PT Time Calculation (min) 57 min    Activity Tolerance Patient tolerated treatment well    Behavior During Therapy [facility] for tasks assessed/performed           Past Medical History:  Diagnosis Date   Chronic fatigue syndrome    Hyperlipidemia    Osteoporosis     Past Surgical History:  Procedure Laterality Date   ABDOMINAL HYSTERECTOMY     one ovary removed/ bleeding   CERVICAL POLYPECTOMY     DILATION AND CURETTAGE OF UTERUS     UNILATERAL SALPINGECTOMY Right     There were no vitals filed for this visit.   Subjective Assessment - 07/21/20 1303    Subjective Pt reported she notice less pubic bone pain by 50%  . Pt found Vitamin E and starting using it    Pertinent History Scoliosis, Chronic Fatigue Syndrome,  Physical Activity:  no walking routine.  Hobbies: reading, listen to music.  Daily fluid intake: water 16 fl oz, 1 cup of coffee, 1 cup of tea in evening.  Bowel mvoements occur daily but last couple of weeks, every couple of days.    Patient Stated Goals have no pain              [facility] PT Assessment - 07/21/20 1354      Observation/Other Assessments   Scoliosis 3 fingers width between low rib and iliac crest      Palpation   Palpation comment adductor/ ITBAND, lateral knee tightness                      Pelvic Floor Special Questions - 07/21/20 1354    External Perineal Exam L posterior pelvic floor tightness  / no tenderness              [facility] Adult PT Treatment/Exercise - 07/21/20 1351      Neuro Re-ed    Neuro Re-ed Details  education. guided relaxation technique for body scan for optimal relaxation      Moist Heat Therapy   Number Minutes Moist Heat 5 Minutes    Moist Heat Location --   L thigh     Manual Therapy   Manual therapy comments STM/MWM at adductor/ ITBAND, lateral knee                       PT Long Term Goals - 07/16/20 1721      PT LONG TERM GOAL #1   Title Pt will report not having to move seatbelt away from L hip across 3 car rides in order to minimize L pubic/ hip pain and to travel    Time 6    Period Weeks    Status On-going      PT LONG TERM GOAL #2   Title Pt will decrease her Pelvic pain on FOTO from 46 pts to < 36 pts,  PFDI Urinary  21pts to < 11 pts, PFDI Prolapse 33 pts to < 23 pts in order to restore QOL and ADLs   ( reassessment:  Pain    , Urinary      ,  Prolapse    )    Time 10    Period Weeks    Status On-going      PT LONG TERM GOAL #3   Title Pt will be IND with scoliosis specific HEP in order to minimize worsening of mm/ spinal/ pelvic imbalances and minimize risk for injuries    Time 10    Period Weeks    Status Partially Met      PT LONG TERM GOAL #4   Title Pt will demo IND with deep core and pelvic floor coordination in order to progress to strengtening    Time 2    Period Weeks    Status On-going      PT LONG TERM GOAL #5   Title Pt will report daily bowel movements ( previously occuring every 3-4 days) in order to restore pelvic floor function    Time 8    Period Weeks    Status On-going      PT LONG TERM GOAL #6   Title Pt will report no pubic pain with radiating L hip pain across 2 weeks in order to walk and perform ADLs and minimize further narrowing of hips/ minimize risk for Fx 2/2 osteoposis, minimize worsening of scoliosis    Time 6    Period Weeks    Status On-going      PT LONG TERM GOAL #7   Title Pt will report she is able to get to  the bathroom bvefore leakage 100% of the time in order to be out in the community    Time 8    Period Weeks    Status On-going      PT LONG TERM GOAL #8   Title Pt will demo no pelvic floor mm tightness and proper lengthening and upward movement of pelvic floor with deep core mm in order to minmize worsening of prolapse, burning, and pain and improve QOL    Time 8    Period Weeks    Status New    Target Date 09/10/20                 Plan - 07/21/20 1359    Clinical Impression Statement Pt's pelvic pain and pubic pain have decreased by 50% across the past visits.   Today, Pt demo'd less spinal deviations which indicate scoliosis-specific HEP are effective. Further assessed pelvic floor which was tight on L > R along with L thigh. Manual Tx external techniques promoted less tensions.  Initiated mindfulness technique training after which pt reported feeling relaxed and her L hip felt looser. Anticipate today's session will help improve imbalance deficits of mm tensions on L 2/2 unequal leg length/ scoliosis and also help pt better manage pain.  Pt continues to benefit from skilled PT    Examination-Activity Limitations Toileting;Sit    Stability/Clinical Decision Making Evolving/Moderate complexity    Rehab Potential Good    PT Frequency 1x / week    PT Duration Other (comment)   10   PT Treatment/Interventions Neuromuscular re-education;Stair training;Moist Heat;Therapeutic activities;Patient/family education;Therapeutic exercise;Cryotherapy;Manual techniques;Taping;Scar mobilization    Consulted and Agree with Plan of Care Patient           Patient will benefit from skilled therapeutic intervention in order to improve the following  deficits and impairments:  Abnormal gait,Decreased mobility,Decreased coordination,Decreased endurance,Decreased activity tolerance,Decreased balance,Decreased strength,Increased muscle spasms,Decreased scar mobility,Postural dysfunction  Visit  Diagnosis: Unequal leg length  Other idiopathic scoliosis, thoracolumbar region  Muscle weakness (generalized)  Other abnormalities of gait and mobility     Problem List Patient Active Problem List   Diagnosis Date Noted   Vaginal irritation 04/15/2020   Pelvic pain 04/15/2020   Bilateral groin pain 03/17/2020   Angiomyolipoma of left kidney 03/11/2020   Left lower quadrant abdominal pain 03/04/2020   Yeast cells and fungal elements present on diagnostic testing 02/11/2020   Welcome to Medicare preventive visit 03/03/2017   Estrogen deficiency 03/03/2017   Borderline low blood pressure determined by examination 10/31/2016   Vitamin B12 deficiency 10/31/2016   Chest pain with moderate risk for cardiac etiology 10/17/2014   Colon cancer screening 02/17/2014   Other screening mammogram 02/07/2011   Routine general medical examination at a health care facility 02/01/2011   Vitamin D deficiency 04/14/2008   Osteoporosis 01/09/2007   Hyperlipidemia with target LDL less than 130 01/04/2007   Atrophic vaginitis 01/04/2007   SCOLIOSIS 01/04/2007   CHRONIC FATIGUE SYNDROME 01/04/2007    Jerl Mina ,PT, DPT, E-RYT  07/21/2020, 2:00 PM  [place] MAIN [facility] SERVICES [address] [place], [place], [address] Phone: [phone]   Fax:  [phone]  Name: Akima Slaugh MRN: [id number] Date of Birth: September 02, 1950

## 2020-07-21 NOTE — Patient Instructions (Signed)
Body scan ( relaxation)

## 2020-07-27 DIAGNOSIS — J00 Acute nasopharyngitis [common cold]: Secondary | ICD-10-CM | POA: Diagnosis not present

## 2020-07-27 DIAGNOSIS — R059 Cough, unspecified: Secondary | ICD-10-CM | POA: Diagnosis not present

## 2020-07-27 DIAGNOSIS — Z03818 Encounter for observation for suspected exposure to other biological agents ruled out: Secondary | ICD-10-CM | POA: Diagnosis not present

## 2020-07-27 DIAGNOSIS — Z20822 Contact with and (suspected) exposure to covid-19: Secondary | ICD-10-CM | POA: Diagnosis not present

## 2020-07-28 ENCOUNTER — Ambulatory Visit: Payer: Medicare HMO | Attending: Obstetrics and Gynecology | Admitting: Physical Therapy

## 2020-07-28 ENCOUNTER — Other Ambulatory Visit: Payer: Self-pay

## 2020-07-28 DIAGNOSIS — M6281 Muscle weakness (generalized): Secondary | ICD-10-CM | POA: Insufficient documentation

## 2020-07-28 DIAGNOSIS — M4125 Other idiopathic scoliosis, thoracolumbar region: Secondary | ICD-10-CM | POA: Insufficient documentation

## 2020-07-28 DIAGNOSIS — M2170 Unequal limb length (acquired), unspecified site: Secondary | ICD-10-CM | POA: Insufficient documentation

## 2020-07-28 DIAGNOSIS — M533 Sacrococcygeal disorders, not elsewhere classified: Secondary | ICD-10-CM | POA: Diagnosis not present

## 2020-07-28 DIAGNOSIS — R2689 Other abnormalities of gait and mobility: Secondary | ICD-10-CM | POA: Insufficient documentation

## 2020-07-28 NOTE — Patient Instructions (Signed)
Deep core level 1 and 2 ( handout) 

## 2020-07-28 NOTE — Therapy (Signed)
[place] MAIN [facility] SERVICES [address] [place], [place], [address] Phone: [phone]   Fax:  [phone]  Physical Therapy Treatment  Patient Details  Name: Tracy [name] MRN: [id number] Date of Birth: 06-25-1951 Referring Provider (PT): Wannetta Sender    Encounter Date: 07/28/2020   PT End of Session - 07/28/20 1446    Visit Number 6    Number of Visits 10    Date for PT Re-Evaluation 08/26/20    PT Start Time 3888    PT Stop Time 1504    PT Time Calculation (min) 61 min    Activity Tolerance Patient tolerated treatment well    Behavior During Therapy [facility] for tasks assessed/performed           Past Medical History:  Diagnosis Date  . Chronic fatigue syndrome   . Hyperlipidemia   . Osteoporosis     Past Surgical History:  Procedure Laterality Date  . ABDOMINAL HYSTERECTOMY     one ovary removed/ bleeding  . CERVICAL POLYPECTOMY    . DILATION AND CURETTAGE OF UTERUS    . UNILATERAL SALPINGECTOMY Right     There were no vitals filed for this visit.   Subjective Assessment - 07/28/20 1409    Subjective Pt reports pelvic pain has been gone for one week since last session. pt no longer has pain at the pubic area when driving with the seatbelt on pressing on there. The burning pain is close to zero with use of the Vitamin E. Pt noticed dull pain at L groin area which occurs not all the time.    Pertinent History Scoliosis, Chronic Fatigue Syndrome,  Physical Activity:  no walking routine.  Hobbies: reading, listen to music.  Daily fluid intake: water 16 fl oz, 1 cup of coffee, 1 cup of tea in evening.  Bowel mvoements occur daily but last couple of weeks, every couple of days.    Patient Stated Goals have no pain                          Pelvic Floor Special Questions - 07/28/20 1445    Pelvic Floor Internal Exam pt consented verbally without contraindications    Exam Type Vaginal    Palpation increased  tightness/ tenderness at 2 oclock, ATLA   proper lengthening post Tx   Strength Flicker             [facility] Adult PT Treatment/Exercise - 07/28/20 1444      Neuro Re-ed    Neuro Re-ed Details  cued for deep core 1 and 2      Moist Heat Therapy   Number Minutes Moist Heat 6 Minutes    Moist Heat Location --   perineum through sheets     Manual Therapy   Internal Pelvic Floor STM/MWM at 2 oclock, ATLA R                       PT Long Term Goals - 07/16/20 1721      PT LONG TERM GOAL #1   Title Pt will report not having to move seatbelt away from L hip across 3 car rides in order to minimize L pubic/ hip pain and to travel    Time 6    Period Weeks    Status On-going      PT LONG TERM GOAL #2   Title Pt will decrease her Pelvic pain on  FOTO from 69 pts to < 36 pts,  PFDI Urinary 21pts to < 11 pts, PFDI Prolapse 33 pts to < 23 pts in order to restore QOL and ADLs   ( reassessment:  Pain    , Urinary      ,  Prolapse    )    Time 10    Period Weeks    Status On-going      PT LONG TERM GOAL #3   Title Pt will be IND with scoliosis specific HEP in order to minimize worsening of mm/ spinal/ pelvic imbalances and minimize risk for injuries    Time 10    Period Weeks    Status Partially Met      PT LONG TERM GOAL #4   Title Pt will demo IND with deep core and pelvic floor coordination in order to progress to strengtening    Time 2    Period Weeks    Status On-going      PT LONG TERM GOAL #5   Title Pt will report daily bowel movements ( previously occuring every 3-4 days) in order to restore pelvic floor function    Time 8    Period Weeks    Status On-going      PT LONG TERM GOAL #6   Title Pt will report no pubic pain with radiating L hip pain across 2 weeks in order to walk and perform ADLs and minimize further narrowing of hips/ minimize risk for Fx 2/2 osteoposis, minimize worsening of scoliosis    Time 6    Period Weeks    Status On-going      PT LONG  TERM GOAL #7   Title Pt will report she is able to get to the bathroom bvefore leakage 100% of the time in order to be out in the community    Time 8    Period Weeks    Status On-going      PT LONG TERM GOAL #8   Title Pt will demo no pelvic floor mm tightness and proper lengthening and upward movement of pelvic floor with deep core mm in order to minmize worsening of prolapse, burning, and pain and improve QOL    Time 8    Period Weeks    Status New    Target Date 09/10/20                 Plan - 07/28/20 1446    Clinical Impression Statement Pt is making milestone improvements with resolution of pelvic pain and burning sensation. Internal manual Tx was provided to minimize L pelvic floor mm tightness. Pt's R pelvic floor did not elicit pelvic floor contraction. Pt required cues for deep core coordination and co-activation of feet to minimize tensions of pelvic floor. L sided pelvic floor mm tightness is related to leg length difference.  Pt continues to benefit from skilled PT    Examination-Activity Limitations Toileting;Sit    Stability/Clinical Decision Making Evolving/Moderate complexity    Rehab Potential Good    PT Frequency 1x / week    PT Duration Other (comment)   10   PT Treatment/Interventions Neuromuscular re-education;Stair training;Moist Heat;Therapeutic activities;Patient/family education;Therapeutic exercise;Cryotherapy;Manual techniques;Taping;Scar mobilization    Consulted and Agree with Plan of Care Patient           Patient will benefit from skilled therapeutic intervention in order to improve the following deficits and impairments:  Abnormal gait,Decreased mobility,Decreased coordination,Decreased endurance,Decreased activity tolerance,Decreased balance,Decreased strength,Increased muscle spasms,Decreased scar mobility,Postural  dysfunction  Visit Diagnosis: Other idiopathic scoliosis, thoracolumbar region  Unequal leg length  Muscle weakness  (generalized)  Other abnormalities of gait and mobility     Problem List Patient Active Problem List   Diagnosis Date Noted  . Vaginal irritation 04/15/2020  . Pelvic pain 04/15/2020  . Bilateral groin pain 03/17/2020  . Angiomyolipoma of left kidney 03/11/2020  . Left lower quadrant abdominal pain 03/04/2020  . Yeast cells and fungal elements present on diagnostic testing 02/11/2020  . Welcome to Medicare preventive visit 03/03/2017  . Estrogen deficiency 03/03/2017  . Borderline low blood pressure determined by examination 10/31/2016  . Vitamin B12 deficiency 10/31/2016  . Chest pain with moderate risk for cardiac etiology 10/17/2014  . Colon cancer screening 02/17/2014  . Other screening mammogram 02/07/2011  . Routine general medical examination at a health care facility 02/01/2011  . Vitamin D deficiency 04/14/2008  . Osteoporosis 01/09/2007  . Hyperlipidemia with target LDL less than 130 01/04/2007  . Atrophic vaginitis 01/04/2007  . SCOLIOSIS 01/04/2007  . CHRONIC FATIGUE SYNDROME 01/04/2007    Jerl Mina ,PT, DPT, E-RYT  07/28/2020, 3:04 PM  [place] MAIN [facility] SERVICES [address] [place], [place], [address] Phone: [phone]   Fax:  [phone]  Name: Tracy [name] MRN: [id number] Date of Birth: 05-24-1951

## 2020-08-04 ENCOUNTER — Ambulatory Visit: Payer: Medicare HMO | Admitting: Physical Therapy

## 2020-08-04 ENCOUNTER — Other Ambulatory Visit: Payer: Self-pay

## 2020-08-04 DIAGNOSIS — M6281 Muscle weakness (generalized): Secondary | ICD-10-CM

## 2020-08-04 DIAGNOSIS — M2170 Unequal limb length (acquired), unspecified site: Secondary | ICD-10-CM | POA: Diagnosis not present

## 2020-08-04 DIAGNOSIS — R2689 Other abnormalities of gait and mobility: Secondary | ICD-10-CM | POA: Diagnosis not present

## 2020-08-04 DIAGNOSIS — M4125 Other idiopathic scoliosis, thoracolumbar region: Secondary | ICD-10-CM

## 2020-08-04 DIAGNOSIS — M533 Sacrococcygeal disorders, not elsewhere classified: Secondary | ICD-10-CM | POA: Diagnosis not present

## 2020-08-04 NOTE — Patient Instructions (Addendum)
   __  Put pillow under hips for Deep core level 1 and 2      Practice proper pelvic floor coordination ( not using abs) and Plant feet down ( 4 corners)  when doing Deep core level 1 and 2   Inhale: expand pelvic floor muscles Exhale" "j" scoop, allow pelvic floor to close, lift first before belly sinks   ( not "draw abdominal muscle to spine" or strain with abdominal muscles")   Deep core level 1  ( quick contraction with 10 breaths)   Deep core level 2,  ( 53min)    ___   Put shoes on upright spine, cross ankle over thigh, instead of hunching over

## 2020-08-04 NOTE — Therapy (Addendum)
[place] MAIN [facility] SERVICES [address] [place], [place], [address] Phone: [phone]   Fax:  [phone]  Physical Therapy Treatment ?progress Note reporting from 07/02/20 -08/04/20 ( 7 visits)   Patient Details  Name: Tracy [name] MRN: [id number] Date of Birth: 05-Apr-1951 Referring Provider (PT): Wannetta Sender    Encounter Date: 08/04/2020   PT End of Session - 08/04/20 1119    Visit Number 7    Number of Visits --    Date for PT Re-Evaluation 10/13/20   PN 1/11 #7   Authorization Type 10 visits between 07/02/20 to 09/03/20  ( today visit 6)    PT Start Time 1100    PT Stop Time 1200    PT Time Calculation (min) 60 min    Activity Tolerance Patient tolerated treatment well    Behavior During Therapy [facility] for tasks assessed/performed           Past Medical History:  Diagnosis Date  . Chronic fatigue syndrome   . Hyperlipidemia   . Osteoporosis     Past Surgical History:  Procedure Laterality Date  . ABDOMINAL HYSTERECTOMY     one ovary removed/ bleeding  . CERVICAL POLYPECTOMY    . DILATION AND CURETTAGE OF UTERUS    . UNILATERAL SALPINGECTOMY Right     There were no vitals filed for this visit.   Subjective Assessment - 08/04/20 1110    Subjective Pt reported she bought more shoe lifts. The burning is better 100% . Last night, pt felt her bladder protruding when applying her Vitamin E cream . The burning only occurs when she touches her bladder. Pubic bone has been gone for atleast the past 2 weeks.    Pertinent History Scoliosis, Chronic Fatigue Syndrome,  Physical Activity:  no walking routine.  Hobbies: reading, listen to music.  Daily fluid intake: water 16 fl oz, 1 cup of coffee, 1 cup of tea in evening.  Bowel mvoements occur daily but last couple of weeks, every couple of days.    Patient Stated Goals have no pain              [facility] PT Assessment - 08/05/20 0937      Palpation   SI assessment  levelled  pelvic girdle, shoulder height more levelled, slight R thorax shift      Ambulation/Gait   Gait Comments No more R thorax shift, excessive L pelvic shift,  reciporcal gait pattern                      Pelvic Floor Special Questions - 08/04/20 1444    Pelvic Floor Internal Exam pt consented verbally without contraindications    Exam Type Vaginal    Palpation increased tightness/ tenderness at 2 oclock anterior mm, suprapubic technique to promote more upward motion with pelvic floor contraction   proper lengthening post Tx   Strength fair squeeze, definite lift             [facility] Adult PT Treatment/Exercise - 08/04/20 1155      Therapeutic Activites    Therapeutic Activities --   reassessed goals, FOTO     Neuro Re-ed    Neuro Re-ed Details  cued for less abdominal overuse with pelvic floor contraction      Moist Heat Therapy   Number Minutes Moist Heat 6 Minutes    Moist Heat Location Other (comment)   perineum     Manual Therapy   Internal  Pelvic Floor STM/MWM anterior mm, suprapubic technique to promote more upward motion with pelvic floor contraction                       PT Long Term Goals - 08/04/20 1113      PT LONG TERM GOAL #1   Title Pt will report not having to move seatbelt away from L hip across 3 car rides in order to minimize L pubic/ hip pain and to travel    Time 6    Period Weeks    Status Achieved      PT LONG TERM GOAL #2   Title Pt will decrease her Pelvic pain on FOTO from 46 pts to < 36 pts,  PFDI Urinary 21pts to < 11 pts, PFDI Prolapse 33 pts to < 23 pts in order to restore QOL and ADLs   ( reassessment 08/04/20 Pain: 4pts  , Urinary   4 pts   ,  Prolapse 4pts    )    Time 10    Period Weeks    Status Achieved      PT LONG TERM GOAL #3   Title Pt will be IND with scoliosis specific HEP in order to minimize worsening of mm/ spinal/ pelvic imbalances and minimize risk for injuries    Time 10    Period Weeks    Status  Achieved      PT LONG TERM GOAL #4   Title Pt will demo IND with deep core and pelvic floor coordination in order to progress to strengtening    Time 2    Period Weeks    Status Achieved      PT LONG TERM GOAL #5   Title Pt will report daily bowel movements ( previously occuring every 3-4 days) in order to restore pelvic floor function    Time 8    Period Weeks    Status Partially Met      Additional Long Term Goals   Additional Long Term Goals Yes      PT LONG TERM GOAL #6   Title Pt will report no pubic pain with radiating L hip pain across 2 weeks in order to walk and perform ADLs and minimize further narrowing of hips/ minimize risk for Fx 2/2 osteoposis, minimize worsening of scoliosis  ( 1/4: reported no pain for 1 week   1/11: reported no pain )    Time 6    Period Weeks    Status Achieved      PT LONG TERM GOAL #7   Title Pt will report she is able to get to the bathroom before leakage 100% of the time in order to be out in the community    Time 8    Period Weeks    Status Achieved      PT LONG TERM GOAL #8   Title Pt will demo no pelvic floor mm tightness and proper lengthening and upward movement of pelvic floor with deep core mm in order to minmize worsening of prolapse, burning, and pain and improve QOL    Time 8    Period Weeks    Status On-going      PT LONG TERM GOAL  #9   TITLE Pt will notice improved prolapse ( noticing bladder protruding from 2 x across 2 months to 1 x across 2 months) in order to improve QOL    Time 8    Period Weeks  Status New    Target Date 09/29/20                 Plan - 08/04/20 1158    Clinical Impression Statement  Across the past 7 visits, pt has achieved 6/9 goals.and progressing well towards remaining goals.  Pts's FOTO score for Pelvic Pain, Urinary, and Prolapse have improved significantly. Pt has not had any more pubic pain that radiates to L hip. Pt is able to get toilet before leakage.  Pt's bowel movements  will continue to improve with further Tx related to pelvic floor.   Pt's leg length difference has been addressed which has helped with achieving a more levelled pelvic girdle. Further manual Tx has addressed scoliosis of spine. Pelvic floor tightness and tenderness have decreased with manual Tx and HEP and burning sensations are improving with application of Vitamin E oil.  These improvements will help yield longer lasting outcomes for her pelvic Sx.    Progressed today pt to deep core coordination and strengthening to improve prolapse and postural stability. These exercises will benefit pt as well given pt's scoliosis and leg length difference. Pt demo'd improved coordination of pelvic floor contraction with cues. Anticipate pt will achieve remaining goals. Pt expressed feeling hopeful and relieved to feel progress to her Sx that she has had for 6 months. Pt remains very motivated and compliant to HEP. Pt continues to benefit from skilled PT.   Marland Kitchen       Examination-Activity Limitations Toileting;Sit    Stability/Clinical Decision Making Evolving/Moderate complexity    Rehab Potential Good    PT Frequency 1x / week    PT Duration Other (comment)   10   PT Treatment/Interventions Neuromuscular re-education;Stair training;Moist Heat;Therapeutic activities;Patient/family education;Therapeutic exercise;Cryotherapy;Manual techniques;Taping;Scar mobilization    Consulted and Agree with Plan of Care Patient           Patient will benefit from skilled therapeutic intervention in order to improve the following deficits and impairments:  Abnormal gait,Decreased mobility,Decreased coordination,Decreased endurance,Decreased activity tolerance,Decreased balance,Decreased strength,Increased muscle spasms,Decreased scar mobility,Postural dysfunction  Visit Diagnosis: Other idiopathic scoliosis, thoracolumbar region  Unequal leg length  Muscle weakness (generalized)  Other abnormalities of gait and  mobility     Problem List Patient Active Problem List   Diagnosis Date Noted  . Vaginal irritation 04/15/2020  . Pelvic pain 04/15/2020  . Bilateral groin pain 03/17/2020  . Angiomyolipoma of left kidney 03/11/2020  . Left lower quadrant abdominal pain 03/04/2020  . Yeast cells and fungal elements present on diagnostic testing 02/11/2020  . Welcome to Medicare preventive visit 03/03/2017  . Estrogen deficiency 03/03/2017  . Borderline low blood pressure determined by examination 10/31/2016  . Vitamin B12 deficiency 10/31/2016  . Chest pain with moderate risk for cardiac etiology 10/17/2014  . Colon cancer screening 02/17/2014  . Other screening mammogram 02/07/2011  . Routine general medical examination at a health care facility 02/01/2011  . Vitamin D deficiency 04/14/2008  . Osteoporosis 01/09/2007  . Hyperlipidemia with target LDL less than 130 01/04/2007  . Atrophic vaginitis 01/04/2007  . SCOLIOSIS 01/04/2007  . CHRONIC FATIGUE SYNDROME 01/04/2007    Jerl Mina  ,PT, DPT, E-RYT  08/05/2020, 9:41 AM  [place] MAIN [facility] SERVICES [address] [place], [place], [address] Phone: [phone]   Fax:  [phone]  Name: Tracy [name] MRN: [id number] Date of Birth: 07-21-1951

## 2020-08-11 ENCOUNTER — Ambulatory Visit: Payer: Medicare HMO | Admitting: Physical Therapy

## 2020-08-11 ENCOUNTER — Other Ambulatory Visit: Payer: Self-pay

## 2020-08-11 DIAGNOSIS — M4125 Other idiopathic scoliosis, thoracolumbar region: Secondary | ICD-10-CM

## 2020-08-11 DIAGNOSIS — R2689 Other abnormalities of gait and mobility: Secondary | ICD-10-CM

## 2020-08-11 DIAGNOSIS — M2170 Unequal limb length (acquired), unspecified site: Secondary | ICD-10-CM

## 2020-08-11 DIAGNOSIS — M6281 Muscle weakness (generalized): Secondary | ICD-10-CM

## 2020-08-12 NOTE — Therapy (Signed)
[place] MAIN [facility] SERVICES [address] [place], [place], [address] Phone: [phone]   Fax:  [phone]  Physical Therapy Treatment  Patient Details  Name: Tracy [name] MRN: [id number] Date of Birth: 03-15-1951 Referring Provider (PT): Wannetta Sender    Encounter Date: 08/11/2020   PT End of Session - 08/12/20 6160    Visit Number 8    Date for PT Re-Evaluation 10/13/20   PN 1/11 #7   Authorization Type 10 visits between 07/02/20 to 09/03/20  ( today visit 8)    PT Start Time 1500    PT Stop Time 1600    PT Time Calculation (min) 60 min    Activity Tolerance Patient tolerated treatment well    Behavior During Therapy [facility] for tasks assessed/performed           Past Medical History:  Diagnosis Date  . Chronic fatigue syndrome   . Hyperlipidemia   . Osteoporosis     Past Surgical History:  Procedure Laterality Date  . ABDOMINAL HYSTERECTOMY     one ovary removed/ bleeding  . CERVICAL POLYPECTOMY    . DILATION AND CURETTAGE OF UTERUS    . UNILATERAL SALPINGECTOMY Right     There were no vitals filed for this visit.   Subjective Assessment - 08/11/20 1513    Subjective Pt has not noticed the prolapse protruding as much. Occasionally, pt has a dull pain at L low pubic area when she does open book exercise. Pt would like to combine her HEP    Pertinent History Scoliosis, Chronic Fatigue Syndrome,  Physical Activity:  no walking routine.  Hobbies: reading, listen to music.  Daily fluid intake: water 16 fl oz, 1 cup of coffee, 1 cup of tea in evening.  Bowel mvoements occur daily but last couple of weeks, every couple of days.    Patient Stated Goals have no pain              [facility] PT Assessment - 08/12/20 1432      Observation/Other Assessments   Scoliosis 3 fingers width between L low rib and iliac crest, 0 on R due to R lateral shift of thorax      Palpation   Palpation comment increased tightness along R  iliocostalis, paraspinal, intercostals medial scapula mm, increased paraspinal mm bulk on R lumbar/ QL mm , L IT/ ant glut med                         [facility] Adult PT Treatment/Exercise - 08/12/20 1443      Therapeutic Activites    Therapeutic Activities --   reassessment on scoliosis     Neuro Re-ed    Neuro Re-ed Details  --      Moist Heat Therapy   Number Minutes Moist Heat 6 Minutes    Moist Heat Location Other (comment)   back     Manual Therapy   Manual therapy comments STM/MWM at problem areas noted in assessment to promote lengethening of R scoliosis concave curve, ER of iliac crest L                       PT Long Term Goals - 08/04/20 1113      PT LONG TERM GOAL #1   Title Pt will report not having to move seatbelt away from L hip across 3 car rides in order to minimize L pubic/ hip pain and  to travel    Time 6    Period Weeks    Status Achieved      PT LONG TERM GOAL #2   Title Pt will decrease her Pelvic pain on FOTO from 46 pts to < 36 pts,  PFDI Urinary 21pts to < 11 pts, PFDI Prolapse 33 pts to < 23 pts in order to restore QOL and ADLs   ( reassessment 08/04/20 Pain: 4pts  , Urinary   4 pts   ,  Prolapse 4pts    )    Time 10    Period Weeks    Status Achieved      PT LONG TERM GOAL #3   Title Pt will be IND with scoliosis specific HEP in order to minimize worsening of mm/ spinal/ pelvic imbalances and minimize risk for injuries    Time 10    Period Weeks    Status Achieved      PT LONG TERM GOAL #4   Title Pt will demo IND with deep core and pelvic floor coordination in order to progress to strengtening    Time 2    Period Weeks    Status Achieved      PT LONG TERM GOAL #5   Title Pt will report daily bowel movements ( previously occuring every 3-4 days) in order to restore pelvic floor function    Time 8    Period Weeks    Status Partially Met      Additional Long Term Goals   Additional Long Term Goals Yes      PT LONG  TERM GOAL #6   Title Pt will report no pubic pain with radiating L hip pain across 2 weeks in order to walk and perform ADLs and minimize further narrowing of hips/ minimize risk for Fx 2/2 osteoposis, minimize worsening of scoliosis  ( 1/4: reported no pain for 1 week   1/11: reported no pain )    Time 6    Period Weeks    Status Achieved      PT LONG TERM GOAL #7   Title Pt will report she is able to get to the bathroom before leakage 100% of the time in order to be out in the community    Time 8    Period Weeks    Status Achieved      PT LONG TERM GOAL #8   Title Pt will demo no pelvic floor mm tightness and proper lengthening and upward movement of pelvic floor with deep core mm in order to minmize worsening of prolapse, burning, and pain and improve QOL    Time 8    Period Weeks    Status On-going      PT LONG TERM GOAL  #9   TITLE Pt will notice improved prolapse ( noticing bladder protruding from 2 x across 2 months to 1 x across 2 months) in order to improve QOL    Time 8    Period Weeks    Status New    Target Date 09/29/20                 Plan - 08/12/20 1452    Clinical Impression Statement Following Tx, pt demo'd increased R thoracic rotation, increased mm mobility at flank where scoliotic concave curve resides between thorax/lumbar regions and reported no more pain at L low abdominal area with R trunk rotation exercise. Plan to continue to providing scoliosis-specific HEP to help with R lateral shift  of thorax. Pt continues to benefit from skilled PT    Examination-Activity Limitations Toileting;Sit    Stability/Clinical Decision Making Evolving/Moderate complexity    Rehab Potential Good    PT Frequency 1x / week    PT Duration Other (comment)   10   PT Treatment/Interventions Neuromuscular re-education;Stair training;Moist Heat;Therapeutic activities;Patient/family education;Therapeutic exercise;Cryotherapy;Manual techniques;Taping;Scar mobilization     Consulted and Agree with Plan of Care Patient           Patient will benefit from skilled therapeutic intervention in order to improve the following deficits and impairments:  Abnormal gait,Decreased mobility,Decreased coordination,Decreased endurance,Decreased activity tolerance,Decreased balance,Decreased strength,Increased muscle spasms,Decreased scar mobility,Postural dysfunction  Visit Diagnosis: Other idiopathic scoliosis, thoracolumbar region  Unequal leg length  Muscle weakness (generalized)  Other abnormalities of gait and mobility     Problem List Patient Active Problem List   Diagnosis Date Noted  . Vaginal irritation 04/15/2020  . Pelvic pain 04/15/2020  . Bilateral groin pain 03/17/2020  . Angiomyolipoma of left kidney 03/11/2020  . Left lower quadrant abdominal pain 03/04/2020  . Yeast cells and fungal elements present on diagnostic testing 02/11/2020  . Welcome to Medicare preventive visit 03/03/2017  . Estrogen deficiency 03/03/2017  . Borderline low blood pressure determined by examination 10/31/2016  . Vitamin B12 deficiency 10/31/2016  . Chest pain with moderate risk for cardiac etiology 10/17/2014  . Colon cancer screening 02/17/2014  . Other screening mammogram 02/07/2011  . Routine general medical examination at a health care facility 02/01/2011  . Vitamin D deficiency 04/14/2008  . Osteoporosis 01/09/2007  . Hyperlipidemia with target LDL less than 130 01/04/2007  . Atrophic vaginitis 01/04/2007  . SCOLIOSIS 01/04/2007  . CHRONIC FATIGUE SYNDROME 01/04/2007    Jerl Mina ,PT, DPT, E-RYT  08/12/2020, 2:55 PM  [place] MAIN [facility] SERVICES [address] [place], [place], [address] Phone: [phone]   Fax:  [phone]  Name: Tanayia Wahlquist MRN: [id number] Date of Birth: 12/09/1950

## 2020-08-18 ENCOUNTER — Other Ambulatory Visit: Payer: Self-pay

## 2020-08-18 ENCOUNTER — Ambulatory Visit: Payer: Medicare HMO | Admitting: Physical Therapy

## 2020-08-18 DIAGNOSIS — R2689 Other abnormalities of gait and mobility: Secondary | ICD-10-CM | POA: Diagnosis not present

## 2020-08-18 DIAGNOSIS — M2170 Unequal limb length (acquired), unspecified site: Secondary | ICD-10-CM | POA: Diagnosis not present

## 2020-08-18 DIAGNOSIS — M4125 Other idiopathic scoliosis, thoracolumbar region: Secondary | ICD-10-CM | POA: Diagnosis not present

## 2020-08-18 DIAGNOSIS — M6281 Muscle weakness (generalized): Secondary | ICD-10-CM

## 2020-08-18 DIAGNOSIS — M533 Sacrococcygeal disorders, not elsewhere classified: Secondary | ICD-10-CM | POA: Diagnosis not present

## 2020-08-18 NOTE — Therapy (Signed)
[place] MAIN [facility] SERVICES [address] [place], [place], [address] Phone: [phone]   Fax:  [phone]  Physical Therapy Treatment  Patient Details  Name: Tracy [name] MRN: [id number] Date of Birth: 21-Dec-1950 Referring Provider (PT): Wannetta Sender    Encounter Date: 08/18/2020   PT End of Session - 08/18/20 1412    Visit Number 9    Date for PT Re-Evaluation 10/13/20   PN 1/11 #7   Authorization Type 10 visits between 07/02/20 to 09/03/20  ( today visit 9)    PT Start Time 1405    PT Stop Time 1500    PT Time Calculation (min) 55 min    Activity Tolerance Patient tolerated treatment well    Behavior During Therapy [facility] for tasks assessed/performed           Past Medical History:  Diagnosis Date  . Chronic fatigue syndrome   . Hyperlipidemia   . Osteoporosis     Past Surgical History:  Procedure Laterality Date  . ABDOMINAL HYSTERECTOMY     one ovary removed/ bleeding  . CERVICAL POLYPECTOMY    . DILATION AND CURETTAGE OF UTERUS    . UNILATERAL SALPINGECTOMY Right     There were no vitals filed for this visit.   Subjective Assessment - 08/18/20 1410    Subjective Pt does not have the pelvic pain and the L low abdominal pain anymore. Burning sensation has resolved with Vitamin E.  Pt's issue is related frequent urination. Pt reported tapered back on her water intake. 32 fl oz per day.  Pt has alway been gone to the bathroom often in the past , searching for bathrooms when out in the community.    Pertinent History Scoliosis, Chronic Fatigue Syndrome,  Physical Activity:  no walking routine.  Hobbies: reading, listen to music.  Daily fluid intake: water 16 fl oz, 1 cup of coffee, 1 cup of tea in evening.  Bowel mvoements occur daily but last couple of weeks, every couple of days.    Patient Stated Goals have no pain              [facility] PT Assessment - 08/18/20 1423      Observation/Other Assessments   Scoliosis  levelled pelvic girdle, 3 fingers width B between low ribs/ iliac crest                      Pelvic Floor Special Questions - 08/18/20 1451    Pelvic Floor Internal Exam pt consented verbally without contraindications    Exam Type Vaginal    Palpation increased puborectalis mm tightness ( posterior to pubic symphysis, with tenderness )    Strength weak squeeze, no lift             [facility] Adult PT Treatment/Exercise - 08/18/20 1453      Therapeutic Activites    Therapeutic Activities --   explained Tx for urinary frequency     Neuro Re-ed    Neuro Re-ed Details  cued for proper sequential movement of pelvic floor and less ab / chest mm overuse      Moist Heat Therapy   Number Minutes Moist Heat 5 Minutes    Moist Heat Location --   perineum/ during explanation of progression of HEP     Manual Therapy   Internal Pelvic Floor STM/MWM at pborectalis B to lengthen  PT Long Term Goals - 08/04/20 1113      PT LONG TERM GOAL #1   Title Pt will report not having to move seatbelt away from L hip across 3 car rides in order to minimize L pubic/ hip pain and to travel    Time 6    Period Weeks    Status Achieved      PT LONG TERM GOAL #2   Title Pt will decrease her Pelvic pain on FOTO from 46 pts to < 36 pts,  PFDI Urinary 21pts to < 11 pts, PFDI Prolapse 33 pts to < 23 pts in order to restore QOL and ADLs   ( reassessment 08/04/20 Pain: 4pts  , Urinary   4 pts   ,  Prolapse 4pts    )    Time 10    Period Weeks    Status Achieved      PT LONG TERM GOAL #3   Title Pt will be IND with scoliosis specific HEP in order to minimize worsening of mm/ spinal/ pelvic imbalances and minimize risk for injuries    Time 10    Period Weeks    Status Achieved      PT LONG TERM GOAL #4   Title Pt will demo IND with deep core and pelvic floor coordination in order to progress to strengtening    Time 2    Period Weeks    Status Achieved      PT  LONG TERM GOAL #5   Title Pt will report daily bowel movements ( previously occuring every 3-4 days) in order to restore pelvic floor function    Time 8    Period Weeks    Status Partially Met      Additional Long Term Goals   Additional Long Term Goals Yes      PT LONG TERM GOAL #6   Title Pt will report no pubic pain with radiating L hip pain across 2 weeks in order to walk and perform ADLs and minimize further narrowing of hips/ minimize risk for Fx 2/2 osteoposis, minimize worsening of scoliosis  ( 1/4: reported no pain for 1 week   1/11: reported no pain )    Time 6    Period Weeks    Status Achieved      PT LONG TERM GOAL #7   Title Pt will report she is able to get to the bathroom before leakage 100% of the time in order to be out in the community    Time 8    Period Weeks    Status Achieved      PT LONG TERM GOAL #8   Title Pt will demo no pelvic floor mm tightness and proper lengthening and upward movement of pelvic floor with deep core mm in order to minmize worsening of prolapse, burning, and pain and improve QOL    Time 8    Period Weeks    Status On-going      PT LONG TERM GOAL  #9   TITLE Pt will notice improved prolapse ( noticing bladder protruding from 2 x across 2 months to 1 x across 2 months) in order to improve QOL    Time 8    Period Weeks    Status New    Target Date 09/29/20                 Plan - 08/18/20 1412    Clinical Impression Statement Urinary frequency is now  the area of focus as pt reported her pelvic pain and L abdominal pain and burning vulvar sensations have resolved.   Pt required more pelvic floor manual Tx to minimize tensions at puborectalis mm B. Pt demo'd improved lengthening and contraction of all 3 layers post training and Tx which will help with frequency and completion of urination.   Discussed the plan to educate about urge suppression technique at next session. Discussed today about not minimizing water to minimize  frequent trips to the bathroom. Pt voiced understanding.  Plan to reassess goals and do recert at next session  Pt continues to benefit from skilled PT     Examination-Activity Limitations Toileting;Sit    Stability/Clinical Decision Making Evolving/Moderate complexity    Rehab Potential Good    PT Frequency 1x / week    PT Duration Other (comment)   10   PT Treatment/Interventions Neuromuscular re-education;Stair training;Moist Heat;Therapeutic activities;Patient/family education;Therapeutic exercise;Cryotherapy;Manual techniques;Taping;Scar mobilization    Consulted and Agree with Plan of Care Patient           Patient will benefit from skilled therapeutic intervention in order to improve the following deficits and impairments:  Abnormal gait,Decreased mobility,Decreased coordination,Decreased endurance,Decreased activity tolerance,Decreased balance,Decreased strength,Increased muscle spasms,Decreased scar mobility,Postural dysfunction  Visit Diagnosis: Other idiopathic scoliosis, thoracolumbar region  Unequal leg length  Other abnormalities of gait and mobility  Muscle weakness (generalized)     Problem List Patient Active Problem List   Diagnosis Date Noted  . Vaginal irritation 04/15/2020  . Pelvic pain 04/15/2020  . Bilateral groin pain 03/17/2020  . Angiomyolipoma of left kidney 03/11/2020  . Left lower quadrant abdominal pain 03/04/2020  . Yeast cells and fungal elements present on diagnostic testing 02/11/2020  . Welcome to Medicare preventive visit 03/03/2017  . Estrogen deficiency 03/03/2017  . Borderline low blood pressure determined by examination 10/31/2016  . Vitamin B12 deficiency 10/31/2016  . Chest pain with moderate risk for cardiac etiology 10/17/2014  . Colon cancer screening 02/17/2014  . Other screening mammogram 02/07/2011  . Routine general medical examination at a health care facility 02/01/2011  . Vitamin D deficiency 04/14/2008  .  Osteoporosis 01/09/2007  . Hyperlipidemia with target LDL less than 130 01/04/2007  . Atrophic vaginitis 01/04/2007  . SCOLIOSIS 01/04/2007  . CHRONIC FATIGUE SYNDROME 01/04/2007    Jerl Mina ,PT, DPT, E-RYT  08/18/2020, 5:48 PM  [place] MAIN [facility] SERVICES [address] [place], [place], [address] Phone: [phone]   Fax:  [phone]  Name: Habiba Treloar MRN: [id number] Date of Birth: Feb 18, 1951

## 2020-08-18 NOTE — Patient Instructions (Addendum)
Practice proper pelvic floor coordination  Inhale: expand pelvic floor muscles Exhale" "j" scoop, allow pelvic floor to close, lift first before belly sinks   ( not "draw abdominal muscle to spine" or strain with abdominal muscles")   ___  Notice sequence when performing the deep core level 1 and 2    ___

## 2020-08-19 DIAGNOSIS — L570 Actinic keratosis: Secondary | ICD-10-CM | POA: Diagnosis not present

## 2020-08-19 DIAGNOSIS — D225 Melanocytic nevi of trunk: Secondary | ICD-10-CM | POA: Diagnosis not present

## 2020-08-19 DIAGNOSIS — X32XXXA Exposure to sunlight, initial encounter: Secondary | ICD-10-CM | POA: Diagnosis not present

## 2020-08-19 DIAGNOSIS — D2262 Melanocytic nevi of left upper limb, including shoulder: Secondary | ICD-10-CM | POA: Diagnosis not present

## 2020-08-19 DIAGNOSIS — D2261 Melanocytic nevi of right upper limb, including shoulder: Secondary | ICD-10-CM | POA: Diagnosis not present

## 2020-08-19 DIAGNOSIS — D2272 Melanocytic nevi of left lower limb, including hip: Secondary | ICD-10-CM | POA: Diagnosis not present

## 2020-08-19 DIAGNOSIS — D2271 Melanocytic nevi of right lower limb, including hip: Secondary | ICD-10-CM | POA: Diagnosis not present

## 2020-08-19 DIAGNOSIS — L821 Other seborrheic keratosis: Secondary | ICD-10-CM | POA: Diagnosis not present

## 2020-08-25 ENCOUNTER — Other Ambulatory Visit: Payer: Self-pay

## 2020-08-25 ENCOUNTER — Ambulatory Visit: Payer: Medicare HMO | Attending: Obstetrics and Gynecology | Admitting: Physical Therapy

## 2020-08-25 DIAGNOSIS — M6281 Muscle weakness (generalized): Secondary | ICD-10-CM | POA: Diagnosis not present

## 2020-08-25 DIAGNOSIS — M4125 Other idiopathic scoliosis, thoracolumbar region: Secondary | ICD-10-CM

## 2020-08-25 DIAGNOSIS — M2170 Unequal limb length (acquired), unspecified site: Secondary | ICD-10-CM | POA: Diagnosis not present

## 2020-08-25 DIAGNOSIS — R2689 Other abnormalities of gait and mobility: Secondary | ICD-10-CM | POA: Diagnosis not present

## 2020-08-26 NOTE — Therapy (Signed)
[place] MAIN [facility] SERVICES [address] [place], [place], [address] Phone: [phone]   Fax:  [phone]  Physical Therapy Treatment . Progress Note reporting from  08/04/20 to 08/26/20   Patient Details  Name: Tracy [name] MRN: [id number] Date of Birth: [birth year] Referring Provider (PT): Valinda Party Date: 08/25/2020   PT End of Session - 08/25/20 1552    Visit Number 10    Date for PT Re-Evaluation 10/13/20   PN 1/11 #7   Authorization Type 10 visits between 07/02/20 to 09/03/20  ( today visit 10)    PT Start Time 1500    PT Stop Time 1600    PT Time Calculation (min) 60 min    Activity Tolerance Patient tolerated treatment well    Behavior During Therapy [facility] for tasks assessed/performed           Past Medical History:  Diagnosis Date  . Chronic fatigue syndrome   . Hyperlipidemia   . Osteoporosis     Past Surgical History:  Procedure Laterality Date  . ABDOMINAL HYSTERECTOMY     one ovary removed/ bleeding  . CERVICAL POLYPECTOMY    . DILATION AND CURETTAGE OF UTERUS    . UNILATERAL SALPINGECTOMY Right     There were no vitals filed for this visit.   Subjective Assessment - 08/25/20 1514    Subjective Pt reported the L abdominal / groin pain returned2 days after last session. But it is at the level of 2-3/10 instead of 7-8/10 and not constant    Pertinent History Scoliosis, Chronic Fatigue Syndrome,  Physical Activity:  no walking routine.  Hobbies: reading, listen to music.  Daily fluid intake: water 16 fl oz, 1 cup of coffee, 1 cup of tea in evening.  Bowel mvoements occur daily but last couple of weeks, every couple of days.    Patient Stated Goals have no pain              [facility] PT Assessment - 08/25/20 1515      Observation/Other Assessments   Observations R shoulder slightly lowered    Scoliosis 3  fingers width between iliac crest/ low rib, 2      AROM   Overall AROM Comments 49 cm R, 53  cm L  sidebend digit III to floor ( Post Tx: 51 cm on L side bend)      Palpation   Spinal mobility tightness at   R medial scap, teres minor, interspinals/ paraspinal, intercostals T3-10                         [facility] Adult PT Treatment/Exercise - 08/26/20 1255      Moist Heat Therapy   Number Minutes Moist Heat 5 Minutes    Moist Heat Location --   R thoracic in a psoition to provide passive stretching     Manual Therapy   Internal Pelvic Floor STM/MWM to address tightness at   R medial scap, teres minor, interspinals/ paraspinal, intercostals T3-10                       PT Long Term Goals - 08/25/20 1553      PT LONG TERM GOAL #1   Title Pt will report not having to move seatbelt away from L hip across 3 car rides in order to minimize L pubic/ hip pain and to travel    Time 6  Period Weeks    Status Achieved      PT LONG TERM GOAL #2   Title Pt will decrease her Pelvic pain on FOTO from 46 pts to < 36 pts,  PFDI Urinary 21pts to < 11 pts, PFDI Prolapse 33 pts to < 23 pts in order to restore QOL and ADLs   ( reassessment 08/04/20 Pain: 4pts  , Urinary   4 pts   ,  Prolapse 4pts    )    Time 10    Period Weeks    Status Achieved      PT LONG TERM GOAL #3   Title Pt will be IND with scoliosis specific HEP in order to minimize worsening of mm/ spinal/ pelvic imbalances and minimize risk for injuries    Time 10    Period Weeks    Status Achieved      PT LONG TERM GOAL #4   Title Pt will demo IND with deep core and pelvic floor coordination in order to progress to strengtening    Time 2    Period Weeks    Status Achieved      PT LONG TERM GOAL #5   Title Pt will report daily bowel movements ( previously occuring every 3-4 days) in order to restore pelvic floor function    Time 8    Period Weeks    Status Achieved      Additional Long Term Goals   Additional Long Term Goals Yes      PT LONG TERM GOAL #6   Title Pt will report no pubic pain  with radiating L hip pain across 2 weeks in order to walk and perform ADLs and minimize further narrowing of hips/ minimize risk for Fx 2/2 osteoposis, minimize worsening of scoliosis  ( 1/4: reported no pain for 1 week   1/11: reported no pain )    Time 6    Period Weeks    Status Partially Met      PT LONG TERM GOAL #7   Title Pt will report she is able to get to the bathroom before leakage 100% of the time in order to be out in the community    Time 8    Period Weeks    Status Achieved      PT LONG TERM GOAL #8   Title Pt will demo no pelvic floor mm tightness and proper lengthening and upward movement of pelvic floor with deep core mm in order to minmize worsening of prolapse, burning, and pain and improve QOL    Time 8    Period Weeks    Status Partially Met      PT LONG TERM GOAL  #9   TITLE Pt will notice improved prolapse ( noticing bladder protruding from 2 x across 2 months to 1 x across 2 months) in order to improve QOL    Time 8    Period Weeks    Status On-going                 Plan - 08/26/20 1248    Clinical Impression Statement Pt achieved 6/9 goals and progressing well towards remaining goals.  Pt is making excellent progress with improved urinary continence, daily bowel movements, and resolution of supra pubic pain and vulva burning. Shoe lift has helped with pelvic girdle alignment. Scoliosis specific HEP and Tx continues to be needed to help minimize pt's L abdominal pain complaints. Pt reported the L abdominal / groin  pain returned 2 days after last session. But it is at the level of 2-3/10 instead of 7-8/10 and not constant.  Addressed lateral shift of thorax 2/2 scoliosis with manual Tx today. Plan to continue applying manual Tx and corrective exercise to this asymmetry.Plan to continue address pelvic prolapse at upcoming sessions as well.   Pt benefit from skilled PT     Examination-Activity Limitations Toileting;Sit    Stability/Clinical Decision Making  Evolving/Moderate complexity    Rehab Potential Good    PT Frequency 1x / week    PT Duration Other (comment)   10   PT Treatment/Interventions Neuromuscular re-education;Stair training;Moist Heat;Therapeutic activities;Patient/family education;Therapeutic exercise;Cryotherapy;Manual techniques;Taping;Scar mobilization    Consulted and Agree with Plan of Care Patient           Patient will benefit from skilled therapeutic intervention in order to improve the following deficits and impairments:  Abnormal gait,Decreased mobility,Decreased coordination,Decreased endurance,Decreased activity tolerance,Decreased balance,Decreased strength,Increased muscle spasms,Decreased scar mobility,Postural dysfunction  Visit Diagnosis: Other idiopathic scoliosis, thoracolumbar region  Unequal leg length  Other abnormalities of gait and mobility  Muscle weakness (generalized)     Problem List Patient Active Problem List   Diagnosis Date Noted  . Vaginal irritation 04/15/2020  . Pelvic pain 04/15/2020  . Bilateral groin pain 03/17/2020  . Angiomyolipoma of left kidney 03/11/2020  . Left lower quadrant abdominal pain 03/04/2020  . Yeast cells and fungal elements present on diagnostic testing 02/11/2020  . Welcome to Medicare preventive visit 03/03/2017  . Estrogen deficiency 03/03/2017  . Borderline low blood pressure determined by examination 10/31/2016  . Vitamin B12 deficiency 10/31/2016  . Chest pain with moderate risk for cardiac etiology 10/17/2014  . Colon cancer screening 02/17/2014  . Other screening mammogram 02/07/2011  . Routine general medical examination at a health care facility 02/01/2011  . Vitamin D deficiency 04/14/2008  . Osteoporosis 01/09/2007  . Hyperlipidemia with target LDL less than 130 01/04/2007  . Atrophic vaginitis 01/04/2007  . SCOLIOSIS 01/04/2007  . CHRONIC FATIGUE SYNDROME 01/04/2007    Jerl Mina 08/26/2020, 12:58 PM  [place] [facility] MAIN [facility] SERVICES [address] [place], [place], [address] Phone: [phone]   Fax:  [phone]  Name: Tracy [name] MRN: [id number] Date of Birth: 1950-11-13

## 2020-08-31 ENCOUNTER — Other Ambulatory Visit: Payer: Self-pay

## 2020-08-31 ENCOUNTER — Ambulatory Visit: Payer: Medicare HMO | Admitting: Physical Therapy

## 2020-08-31 DIAGNOSIS — M2170 Unequal limb length (acquired), unspecified site: Secondary | ICD-10-CM

## 2020-08-31 DIAGNOSIS — M4125 Other idiopathic scoliosis, thoracolumbar region: Secondary | ICD-10-CM | POA: Diagnosis not present

## 2020-08-31 DIAGNOSIS — R2689 Other abnormalities of gait and mobility: Secondary | ICD-10-CM | POA: Diagnosis not present

## 2020-08-31 DIAGNOSIS — M6281 Muscle weakness (generalized): Secondary | ICD-10-CM

## 2020-08-31 NOTE — Progress Notes (Signed)
[facility] Return Visit  SUBJECTIVE  History of Present Illness: Tracy [name] is a 70 y.o. female seen in follow-up for levator spasm and vaginal atrophy. Plan at last visit was to start vaginal valium 2mg  tablets and to start physical therapy. She was also advised to use a vaginal moisturizer in between estrogen.   She feels that she has had good improvement with physical therapy. Has had a lot of work with her scoliosis and her pelvic pain is gone. Had also used the valium for about 3 weeks and that also helped her pain.   Is using vitamin E oil, and no longer has the burning. Still also using estrogen twice a week.   Occasionally has some irritation from her prolapse. Has urinary frequency- drinks 1 cup coffee in AM and 1 cup tea in evening. Has had two accidents on the way to the bathroom. She is working on Neurosurgeon on the way to the bathroom.    Past Medical History: Patient  has a past medical history of Chronic fatigue syndrome, Hyperlipidemia, and Osteoporosis.   Past Surgical History: She  has a past surgical history that includes Abdominal hysterectomy; Dilation and curettage of uterus; Cervical polypectomy; and Unilateral salpingectomy (Right).   Medications: She has a current medication list which includes the following prescription(s): calcium-vitamin d-vitamin k, vitamin d, cyanocobalamin, diazepam, estradiol, raloxifene, simvastatin, and triamcinolone cream.   Allergies: Patient is allergic to alendronate sodium, amoxicillin, ibuprofen, and ivp dye [iodinated diagnostic agents].   Social History: Patient  reports that she has never smoked. She has never used smokeless tobacco. She reports that she does not drink alcohol and does not use drugs.      OBJECTIVE     Physical Exam: Vitals:   09/02/20 1012  BP: 119/69  Pulse: 73  Weight: 115 lb (52.2 kg)  Height: 5\' 2"  (1.575 m)   Gen: No apparent distress, A&O x 3.  Detailed  Urogynecologic Evaluation:  Deferred.     ASSESSMENT AND PLAN    Tracy [name] is a 70 y.o. with:  1. Levator spasm   2. Vaginal atrophy   3. Prolapse of anterior vaginal wall   4. Urinary frequency    1. Levator spasm - Has seen good improvement with valium for a few weeks then physical therapy. She has a few appointments left with PT. Not regularly using the valium. Encouraged to continue with PT exercises indefinitely.   2. Vaginal atrophy, improved - Continue estrace cream twice weekly then vitamin E up to 3 times a day as needed.   3. Prolapse - Discussed the use of a pessary if she does not see improvement with physical therapy. Her symptoms are only occasionally irritative to her so she will wait to decide if she needs additional support with a pessary.   4. Urinary frequency - Discussed avoiding bladder irritants and using distraction techniques to retrain the bladder. She is working on this at [employer]. We reviewed that medications for OAB are also an option if she feels her symptoms do not improve.   Follow up as needed.   [name], MD   Time spent: I spent 30 minutes dedicated to the care of this patient on the date of this encounter to include pre-visit review of records, face-to-face time with the patient and post visit documentation.

## 2020-08-31 NOTE — Therapy (Signed)
[place] MAIN [facility] SERVICES [address] [place], [place], [address] Phone: [phone]   Fax:  [phone]  Physical Therapy Treatment  Patient Details  Name: Tracy [name] MRN: [id number] Date of Birth: [birth year] Referring Provider (PT): Wannetta Sender    Encounter Date: 08/31/2020   PT End of Session - 08/31/20 1200    Visit Number 11    Date for PT Re-Evaluation 10/13/20   PN 1/11 #7   Authorization Type 1/10    PT Start Time 1100    PT Stop Time 1200    PT Time Calculation (min) 60 min    Activity Tolerance Patient tolerated treatment well    Behavior During Therapy [facility] for tasks assessed/performed           Past Medical History:  Diagnosis Date  . Chronic fatigue syndrome   . Hyperlipidemia   . Osteoporosis     Past Surgical History:  Procedure Laterality Date  . ABDOMINAL HYSTERECTOMY     one ovary removed/ bleeding  . CERVICAL POLYPECTOMY    . DILATION AND CURETTAGE OF UTERUS    . UNILATERAL SALPINGECTOMY Right     There were no vitals filed for this visit.   Subjective Assessment - 08/31/20 1107    Subjective Pt reported her L abdominal pain is 100% better for the past 4 days. Pelvic pain is still resolved.    Pertinent History Scoliosis, Chronic Fatigue Syndrome,  Physical Activity:  no walking routine.  Hobbies: reading, listen to music.  Daily fluid intake: water 16 fl oz, 1 cup of coffee, 1 cup of tea in evening.  Bowel mvoements occur daily but last couple of weeks, every couple of days.    Patient Stated Goals have no pain              [facility] PT Assessment - 08/31/20 1108      AROM   Overall AROM Comments L / R sidebend 54 cm digit III to floor                      Pelvic Floor Special Questions - 08/31/20 1549    Pelvic Floor Internal Exam pt consented verbally without contraindications    Exam Type Vaginal    Palpation increased puborectalis mm tightness ( posterior to pubic  symphysis, with tenderness  11, 2 oclock  )   post Tx: improved contraction but still not appropriate yet for pelvic floor training   Strength weak squeeze, no lift             [facility] Adult PT Treatment/Exercise - 08/31/20 1538      Therapeutic Activites    Other Therapeutic Activities reassessed scoliosis      Neuro Re-ed    Neuro Re-ed Details  cued for sequential contraction of pelvic floor mm with less ab overuse      Moist Heat Therapy   Number Minutes Moist Heat 5 Minutes    Moist Heat Location Other (comment)   perineum through sheets     Manual Therapy   Internal Pelvic Floor STM/MWM to address  minor tightness at puborrectalis 11  and 2 o clock with flinching tenderness. palpation and Tx was modified                       PT Long Term Goals - 08/31/20 1112      PT LONG TERM GOAL #1   Title Pt will report  not having to move seatbelt away from L hip across 3 car rides in order to minimize L pubic/ hip pain and to travel    Time 6    Period Weeks    Status Achieved      PT LONG TERM GOAL #2   Title Pt will decrease her Pelvic pain on FOTO from 46 pts to < 36 pts,  PFDI Urinary 21pts to < 11 pts, PFDI Prolapse 33 pts to < 23 pts in order to restore QOL and ADLs   ( reassessment 08/04/20 Pain: 4pts  , Urinary   4 pts   ,  Prolapse 4pts    )    Time 10    Period Weeks    Status Achieved      PT LONG TERM GOAL #3   Title Pt will be IND with scoliosis specific HEP in order to minimize worsening of mm/ spinal/ pelvic imbalances and minimize risk for injuries    Time 10    Period Weeks    Status Achieved      PT LONG TERM GOAL #4   Title Pt will demo IND with deep core and pelvic floor coordination in order to progress to strengtening    Time 2    Period Weeks    Status Achieved      PT LONG TERM GOAL #5   Title Pt will report daily bowel movements ( previously occuring every 3-4 days) in order to restore pelvic floor function    Time 8    Period Weeks     Status Achieved      PT LONG TERM GOAL #6   Title Pt will report no pubic pain with radiating L hip pain across 2 weeks in order to walk and perform ADLs and minimize further narrowing of hips/ minimize risk for Fx 2/2 osteoposis, minimize worsening of scoliosis  ( 1/4: reported no pain for 1 week   1/11: reported no pain  08/31/20: week after scoliosis thorax shift was performed and reported no pain for 4 days  )    Time 6    Period Weeks    Status Partially Met      PT LONG TERM GOAL #7   Title Pt will report she is able to get to the bathroom before leakage 100% of the time in order to be out in the community    Time 8    Period Weeks    Status Achieved      PT LONG TERM GOAL #8   Title Pt will demo no pelvic floor mm tightness and proper lengthening and upward movement of pelvic floor with deep core mm in order to minmize worsening of prolapse, burning, and pain and improve QOL    Time 8    Period Weeks    Status Partially Met      PT LONG TERM GOAL  #9   TITLE Pt will notice improved prolapse ( noticing bladder protruding from 2 x across 2 months to 1 x across 2 months) in order to improve QOL    Time 8    Period Weeks    Status On-going                 Plan - 08/31/20 1529    Clinical Impression Statement Pt 's L abdominal pain improved after last session as pt reported she did not feel it for 4 days. This indicates that addressing her scoliosis and R thoracic shift is  effective. Today, pt demo'd good carry over with more distance between lower rib and iliac c rest bilaterally instead of a shorter distance on the R. Also today, pt demo'd less anterior pelvic floor mm tightness after Tx. Initiated pelvic floor quick contraction but withheld from HEP due difficulty with eliciting sequential contraction of all 3 layers. Pt continues to benefit from skilled PT to progress with pelvic floor strengthening and further address scoliosis at upcoming sessions.      Examination-Activity Limitations Toileting;Sit    Stability/Clinical Decision Making Evolving/Moderate complexity    Rehab Potential Good    PT Frequency 1x / week    PT Duration Other (comment)   10   PT Treatment/Interventions Neuromuscular re-education;Stair training;Moist Heat;Therapeutic activities;Patient/family education;Therapeutic exercise;Cryotherapy;Manual techniques;Taping;Scar mobilization    Consulted and Agree with Plan of Care Patient           Patient will benefit from skilled therapeutic intervention in order to improve the following deficits and impairments:  Abnormal gait,Decreased mobility,Decreased coordination,Decreased endurance,Decreased activity tolerance,Decreased balance,Decreased strength,Increased muscle spasms,Decreased scar mobility,Postural dysfunction  Visit Diagnosis: Other idiopathic scoliosis, thoracolumbar region  Unequal leg length  Other abnormalities of gait and mobility  Muscle weakness (generalized)     Problem List Patient Active Problem List   Diagnosis Date Noted  . Vaginal irritation 04/15/2020  . Pelvic pain 04/15/2020  . Bilateral groin pain 03/17/2020  . Angiomyolipoma of left kidney 03/11/2020  . Left lower quadrant abdominal pain 03/04/2020  . Yeast cells and fungal elements present on diagnostic testing 02/11/2020  . Welcome to Medicare preventive visit 03/03/2017  . Estrogen deficiency 03/03/2017  . Borderline low blood pressure determined by examination 10/31/2016  . Vitamin B12 deficiency 10/31/2016  . Chest pain with moderate risk for cardiac etiology 10/17/2014  . Colon cancer screening 02/17/2014  . Other screening mammogram 02/07/2011  . Routine general medical examination at a health care facility 02/01/2011  . Vitamin D deficiency 04/14/2008  . Osteoporosis 01/09/2007  . Hyperlipidemia with target LDL less than 130 01/04/2007  . Atrophic vaginitis 01/04/2007  . SCOLIOSIS 01/04/2007  . CHRONIC FATIGUE  SYNDROME 01/04/2007    Jerl Mina 08/31/2020, 4:01 PM  [employer] MAIN [facility] SERVICES [address] [place], [place], [address] Phone: [phone]   Fax:  [phone]  Name: Tracy [name] MRN: [id number] Date of Birth: 1951-03-11

## 2020-08-31 NOTE — Patient Instructions (Addendum)
Practice proper pelvic floor coordination  Inhale: expand pelvic floor muscles Exhale" "j" scoop, allow pelvic floor to close, lift first before belly sinks   ( not "draw abdominal muscle to spine" or pushing, strain with abdominal muscles")   ___    Scoliosis for the R low back area to lengthen the space between low ribs and hip     Side of hip stretch:  Reclined twist for hips and side of the hips/ legs  Lay on your back, knees bend Scoot hips to the R , leave shoulders in place Rock knees back and forth with pillow between knees   ____   kitchen counter stretches  1) Bring R hand to the L, and stretch the R side trunk  3 breaths   Brings hands to center again   2) Modified thread the needle L hand on R thigh,  R thigh pushing out slightly as the L hands pull in,  elbow bent and pulls to the L,  Look under R armpit  Keep buttocks pulled back and equal weight on both legs

## 2020-09-01 ENCOUNTER — Encounter: Payer: Medicare HMO | Admitting: Physical Therapy

## 2020-09-02 ENCOUNTER — Other Ambulatory Visit: Payer: Self-pay

## 2020-09-02 ENCOUNTER — Encounter: Payer: Self-pay | Admitting: Obstetrics and Gynecology

## 2020-09-02 ENCOUNTER — Ambulatory Visit (INDEPENDENT_AMBULATORY_CARE_PROVIDER_SITE_OTHER): Payer: Medicare HMO | Admitting: Obstetrics and Gynecology

## 2020-09-02 VITALS — BP 119/69 | HR 73 | Ht 62.0 in | Wt 115.0 lb

## 2020-09-02 DIAGNOSIS — M62838 Other muscle spasm: Secondary | ICD-10-CM

## 2020-09-02 DIAGNOSIS — R350 Frequency of micturition: Secondary | ICD-10-CM

## 2020-09-02 DIAGNOSIS — N8110 Cystocele, unspecified: Secondary | ICD-10-CM

## 2020-09-02 DIAGNOSIS — N952 Postmenopausal atrophic vaginitis: Secondary | ICD-10-CM | POA: Diagnosis not present

## 2020-09-02 NOTE — Patient Instructions (Addendum)

## 2020-09-08 ENCOUNTER — Encounter: Payer: Medicare HMO | Admitting: Physical Therapy

## 2020-09-17 ENCOUNTER — Encounter: Payer: Medicare HMO | Admitting: Physical Therapy

## 2020-09-24 ENCOUNTER — Other Ambulatory Visit: Payer: Self-pay

## 2020-09-24 ENCOUNTER — Ambulatory Visit: Payer: Medicare HMO | Attending: Obstetrics and Gynecology | Admitting: Physical Therapy

## 2020-09-24 DIAGNOSIS — M4125 Other idiopathic scoliosis, thoracolumbar region: Secondary | ICD-10-CM | POA: Diagnosis not present

## 2020-09-24 DIAGNOSIS — M2170 Unequal limb length (acquired), unspecified site: Secondary | ICD-10-CM | POA: Diagnosis present

## 2020-09-24 DIAGNOSIS — M6281 Muscle weakness (generalized): Secondary | ICD-10-CM | POA: Diagnosis present

## 2020-09-24 DIAGNOSIS — M533 Sacrococcygeal disorders, not elsewhere classified: Secondary | ICD-10-CM | POA: Insufficient documentation

## 2020-09-24 DIAGNOSIS — R2689 Other abnormalities of gait and mobility: Secondary | ICD-10-CM | POA: Diagnosis present

## 2020-09-24 NOTE — Patient Instructions (Signed)
Practice proper pelvic floor coordination  Inhale: expand pelvic floor muscles Exhale" "j" scoop, allow pelvic floor to close, lift first before belly sinks   ( not "draw abdominal muscle to spine" or strain with abdominal muscles")   __  Deep core level 1 ( breath less with ab muscles, and use J scoop awareness)   Deep core level 2 ( less movement)   ___  Decrease scoliosis program to one a day and do deep core level ( 2x day)

## 2020-09-24 NOTE — Therapy (Signed)
[place] MAIN [facility] SERVICES [address] [place], [place], [address] Phone: [phone]   Fax:  [phone]  Physical Therapy Treatment  Patient Details  Name: Tracy [name] MRN: [id number] Date of Birth: 1950/12/31 Referring Provider (PT): Wannetta Sender    Encounter Date: 09/24/2020   PT End of Session - 09/24/20 1411    Visit Number 12    Date for PT Re-Evaluation 10/13/20   PN 1/11 #7   Authorization Type 1/5 ( authorization from 09/22/20 to 10/22/20)    PT Start Time 1403    PT Stop Time 1500    PT Time Calculation (min) 57 min    Activity Tolerance Patient tolerated treatment well    Behavior During Therapy [facility] for tasks assessed/performed           Past Medical History:  Diagnosis Date  . Chronic fatigue syndrome   . Hyperlipidemia   . Osteoporosis     Past Surgical History:  Procedure Laterality Date  . ABDOMINAL HYSTERECTOMY     one ovary removed/ bleeding  . CERVICAL POLYPECTOMY    . DILATION AND CURETTAGE OF UTERUS    . UNILATERAL SALPINGECTOMY Right     There were no vitals filed for this visit.   Subjective Assessment - 09/24/20 1410    Subjective Pt has been able to delay urination for 30 min without leakage    Pertinent History Scoliosis, Chronic Fatigue Syndrome,  Physical Activity:  no walking routine.  Hobbies: reading, listen to music.  Daily fluid intake: water 16 fl oz, 1 cup of coffee, 1 cup of tea in evening.  Bowel mvoements occur daily but last couple of weeks, every couple of days.    Patient Stated Goals have no pain                          Pelvic Floor Special Questions - 09/24/20 1746    Pelvic Floor Internal Exam pt consented verbally without contraindications    Exam Type Vaginal    Palpation increased puborectalis mm ischiocavernosus B ( L > R)   post Tx: improved contraction but still not appropriate yet for pelvic floor training   Strength fair squeeze, definite lift    pillow under hips, cued for less ab overuse            [facility] Adult PT Treatment/Exercise - 09/24/20 1738      Therapeutic Activites    Other Therapeutic Activities explained further pelvic floor coordination and strengthening will help with prolapse and modified HEP      Neuro Re-ed    Neuro Re-ed Details  cued for proper pelvic floor contractions /deep core, cued for less ab overuse      Moist Heat Therapy   Number Minutes Moist Heat 5 Minutes    Moist Heat Location --   perineum through sheets     Manual Therapy   Internal Pelvic Floor STM/MWM to address minor tightness at puborectalis B and ischiocavernosus and promote cardal movement of pelvic floor and organs                       PT Long Term Goals - 08/31/20 1112      PT LONG TERM GOAL #1   Title Pt will report not having to move seatbelt away from L hip across 3 car rides in order to minimize L pubic/ hip pain and to travel  Time 6    Period Weeks    Status Achieved      PT LONG TERM GOAL #2   Title Pt will decrease her Pelvic pain on FOTO from 46 pts to < 36 pts,  PFDI Urinary 21pts to < 11 pts, PFDI Prolapse 33 pts to < 23 pts in order to restore QOL and ADLs   ( reassessment 08/04/20 Pain: 4pts  , Urinary   4 pts   ,  Prolapse 4pts    )    Time 10    Period Weeks    Status Achieved      PT LONG TERM GOAL #3   Title Pt will be IND with scoliosis specific HEP in order to minimize worsening of mm/ spinal/ pelvic imbalances and minimize risk for injuries    Time 10    Period Weeks    Status Achieved      PT LONG TERM GOAL #4   Title Pt will demo IND with deep core and pelvic floor coordination in order to progress to strengtening    Time 2    Period Weeks    Status Achieved      PT LONG TERM GOAL #5   Title Pt will report daily bowel movements ( previously occuring every 3-4 days) in order to restore pelvic floor function    Time 8    Period Weeks    Status Achieved      PT LONG TERM GOAL #6    Title Pt will report no pubic pain with radiating L hip pain across 2 weeks in order to walk and perform ADLs and minimize further narrowing of hips/ minimize risk for Fx 2/2 osteoposis, minimize worsening of scoliosis  ( 1/4: reported no pain for 1 week   1/11: reported no pain  08/31/20: week after scoliosis thorax shift was performed and reported no pain for 4 days  )    Time 6    Period Weeks    Status Partially Met      PT LONG TERM GOAL #7   Title Pt will report she is able to get to the bathroom before leakage 100% of the time in order to be out in the community    Time 8    Period Weeks    Status Achieved      PT LONG TERM GOAL #8   Title Pt will demo no pelvic floor mm tightness and proper lengthening and upward movement of pelvic floor with deep core mm in order to minmize worsening of prolapse, burning, and pain and improve QOL    Time 8    Period Weeks    Status Partially Met      PT LONG TERM GOAL  #9   TITLE Pt will notice improved prolapse ( noticing bladder protruding from 2 x across 2 months to 1 x across 2 months) in order to improve QOL    Time 8    Period Weeks    Status On-going                 Plan - 09/24/20 1412    Clinical Impression Statement Pt required further manual Tx to minimize anterior pelvic floor mm tightness. Required cues for less ab mm overuse and propped pillow under hips to create antigravity position for proper pelvic floor contraction. Withheld kegels today and will wait until pt demo no more pelvic floor tightness. Pt continues to benefit from skilled PT   Examination-Activity Limitations  Toileting;Sit    Stability/Clinical Decision Making Evolving/Moderate complexity    Rehab Potential Good    PT Frequency 1x / week    PT Duration Other (comment)   10   PT Treatment/Interventions Neuromuscular re-education;Stair training;Moist Heat;Therapeutic activities;Patient/family education;Therapeutic exercise;Cryotherapy;Manual  techniques;Taping;Scar mobilization    Consulted and Agree with Plan of Care Patient           Patient will benefit from skilled therapeutic intervention in order to improve the following deficits and impairments:  Abnormal gait,Decreased mobility,Decreased coordination,Decreased endurance,Decreased activity tolerance,Decreased balance,Decreased strength,Increased muscle spasms,Decreased scar mobility,Postural dysfunction  Visit Diagnosis: Other idiopathic scoliosis, thoracolumbar region  Unequal leg length  Muscle weakness (generalized)  Other abnormalities of gait and mobility     Problem List Patient Active Problem List   Diagnosis Date Noted  . Vaginal irritation 04/15/2020  . Pelvic pain 04/15/2020  . Bilateral groin pain 03/17/2020  . Angiomyolipoma of left kidney 03/11/2020  . Left lower quadrant abdominal pain 03/04/2020  . Yeast cells and fungal elements present on diagnostic testing 02/11/2020  . Welcome to Medicare preventive visit 03/03/2017  . Estrogen deficiency 03/03/2017  . Borderline low blood pressure determined by examination 10/31/2016  . Vitamin B12 deficiency 10/31/2016  . Chest pain with moderate risk for cardiac etiology 10/17/2014  . Colon cancer screening 02/17/2014  . Other screening mammogram 02/07/2011  . Routine general medical examination at a health care facility 02/01/2011  . Vitamin D deficiency 04/14/2008  . Osteoporosis 01/09/2007  . Hyperlipidemia with target LDL less than 130 01/04/2007  . Atrophic vaginitis 01/04/2007  . SCOLIOSIS 01/04/2007  . CHRONIC FATIGUE SYNDROME 01/04/2007    Jerl Mina ,PT, DPT, E-RYT  09/24/2020, 5:48 PM  [place] MAIN [facility] SERVICES [address] [place], [place], [address] Phone: [phone]   Fax:  [phone]  Name: Chailyn Racette MRN: [id number] Date of Birth: Aug 01, 1950

## 2020-10-01 ENCOUNTER — Ambulatory Visit: Payer: Medicare HMO | Admitting: Physical Therapy

## 2020-10-01 ENCOUNTER — Other Ambulatory Visit: Payer: Self-pay

## 2020-10-01 DIAGNOSIS — M2170 Unequal limb length (acquired), unspecified site: Secondary | ICD-10-CM | POA: Diagnosis not present

## 2020-10-01 DIAGNOSIS — M533 Sacrococcygeal disorders, not elsewhere classified: Secondary | ICD-10-CM | POA: Diagnosis not present

## 2020-10-01 DIAGNOSIS — R2689 Other abnormalities of gait and mobility: Secondary | ICD-10-CM

## 2020-10-01 DIAGNOSIS — M4125 Other idiopathic scoliosis, thoracolumbar region: Secondary | ICD-10-CM | POA: Diagnosis not present

## 2020-10-01 DIAGNOSIS — M6281 Muscle weakness (generalized): Secondary | ICD-10-CM | POA: Diagnosis not present

## 2020-10-01 NOTE — Patient Instructions (Addendum)
Lying on back, knees bent    band under ballmounds  while laying on back w/ knees bent  "W" exercise  10 reps x 2 sets   Band is placed under feet, knees bent, feet are hip width apart Hold band with thumbs point out, keep upper arm and elbow touching the bed the whole time  - inhale and then exhale pull bands by bending elbows hands move in a "w"  (feel shoulder blades squeezing)    ___  Stretch for pelvic floor   V- slides  "v heels slide away and then back toward buttocks and then rock knee to slight ,  slide heel along at 11 o clock away from buttocks   10 reps      On belly: Pillows under belly, childs pose    Mermaid stretch  Rocking while seated on the floor with heels to one side of the hip Heels to one side of the hip  Rock forward towards the knee that is bent , rock beck towards the opposite sitting bones

## 2020-10-01 NOTE — Therapy (Addendum)
[place] MAIN [facility] SERVICES [address] [place], [place], [address] Phone: [phone]   Fax:  [phone]  Physical Therapy Treatment  Patient Details  Name: Tracy [name] MRN: [id number] Date of Birth: 01/11/1951 Referring Provider (PT): Valinda Party Date: 10/01/2020   PT End of Session - 10/01/20 1510    Visit Number 13    Date for PT Re-Evaluation 10/13/20   PN 1/11 #7   Authorization Type 1/5 ( authorization from 09/22/20 to 10/22/20)    PT Start Time 1500    PT Stop Time 1605    PT Time Calculation (min) 65 min    Activity Tolerance Patient tolerated treatment well    Behavior During Therapy [facility] for tasks assessed/performed           Past Medical History:  Diagnosis Date  . Chronic fatigue syndrome   . Hyperlipidemia   . Osteoporosis     Past Surgical History:  Procedure Laterality Date  . ABDOMINAL HYSTERECTOMY     one ovary removed/ bleeding  . CERVICAL POLYPECTOMY    . DILATION AND CURETTAGE OF UTERUS    . UNILATERAL SALPINGECTOMY Right     There were no vitals filed for this visit.   Subjective Assessment - 10/01/20 1409    Subjective Pt report she feels she was pulling up ther frotn pelvic floor muscles with exercises.    Pertinent History Scoliosis, Chronic Fatigue Syndrome,  Physical Activity:  no walking routine.  Hobbies: reading, listen to music.  Daily fluid intake: water 16 fl oz, 1 cup of coffee, 1 cup of tea in evening.  Bowel mvoements occur daily but last couple of weeks, every couple of days.    Patient Stated Goals have no pain                          Pelvic Floor Special Questions - 10/01/20 1519    External Perineal Exam tightness at anterior mm , pubic symphysis, adductors L, medial iliac crest L             [facility] Adult PT Treatment/Exercise - 10/01/20 1518      Neuro Re-ed    Neuro Re-ed Details  cued for new HEP ( scapulothoracic strengtehning)  and pelvic  stretches      Manual Therapy   Manual therapy comments STM/MWM at anterior mm , pubic symphysis, adductors L, medial iliac crest L                       PT Long Term Goals - 08/31/20 1112      PT LONG TERM GOAL #1   Title Pt will report not having to move seatbelt away from L hip across 3 car rides in order to minimize L pubic/ hip pain and to travel    Time 6    Period Weeks    Status Achieved      PT LONG TERM GOAL #2   Title Pt will decrease her Pelvic pain on FOTO from 46 pts to < 36 pts,  PFDI Urinary 21pts to < 11 pts, PFDI Prolapse 33 pts to < 23 pts in order to restore QOL and ADLs   ( reassessment 08/04/20 Pain: 4pts  , Urinary   4 pts   ,  Prolapse 4pts    )    Time 10    Period Weeks    Status Achieved  PT LONG TERM GOAL #3   Title Pt will be IND with scoliosis specific HEP in order to minimize worsening of mm/ spinal/ pelvic imbalances and minimize risk for injuries    Time 10    Period Weeks    Status Achieved      PT LONG TERM GOAL #4   Title Pt will demo IND with deep core and pelvic floor coordination in order to progress to strengtening    Time 2    Period Weeks    Status Achieved      PT LONG TERM GOAL #5   Title Pt will report daily bowel movements ( previously occuring every 3-4 days) in order to restore pelvic floor function    Time 8    Period Weeks    Status Achieved      PT LONG TERM GOAL #6   Title Pt will report no pubic pain with radiating L hip pain across 2 weeks in order to walk and perform ADLs and minimize further narrowing of hips/ minimize risk for Fx 2/2 osteoposis, minimize worsening of scoliosis  ( 1/4: reported no pain for 1 week   1/11: reported no pain  08/31/20: week after scoliosis thorax shift was performed and reported no pain for 4 days  )    Time 6    Period Weeks    Status Partially Met      PT LONG TERM GOAL #7   Title Pt will report she is able to get to the bathroom before leakage 100% of the time in order  to be out in the community    Time 8    Period Weeks    Status Achieved      PT LONG TERM GOAL #8   Title Pt will demo no pelvic floor mm tightness and proper lengthening and upward movement of pelvic floor with deep core mm in order to minmize worsening of prolapse, burning, and pain and improve QOL    Time 8    Period Weeks    Status Partially Met      PT LONG TERM GOAL  #9   TITLE Pt will notice improved prolapse ( noticing bladder protruding from 2 x across 2 months to 1 x across 2 months) in order to improve QOL    Time 8    Period Weeks    Status On-going                 Plan - 10/01/20 1518    Clinical Impression Statement Pt required more manual Tx to minimize anterior mm tightness and tightness at adductor/ medial iliac crest region on L. This is the side where her scoliosis occurs leading to limited space at flank region between lower rib and iliac crest. Anticipate improvements in this area will help with her pubic pain, L hip pain, and lowered pelvic organ. Initiated scapulothoracic strengthening in gravity -eliminated position as this position will help with addressing prolapse as well while strengthening her back mm. Pt continues to benefit from skilled PT    Examination-Activity Limitations Toileting;Sit    Stability/Clinical Decision Making Evolving/Moderate complexity    Rehab Potential Good    PT Frequency 1x / week    PT Duration Other (comment)   10   PT Treatment/Interventions Neuromuscular re-education;Stair training;Moist Heat;Therapeutic activities;Patient/family education;Therapeutic exercise;Cryotherapy;Manual techniques;Taping;Scar mobilization    Consulted and Agree with Plan of Care Patient           Patient will benefit from skilled  therapeutic intervention in order to improve the following deficits and impairments:  Abnormal gait,Decreased mobility,Decreased coordination,Decreased endurance,Decreased activity tolerance,Decreased balance,Decreased  strength,Increased muscle spasms,Decreased scar mobility,Postural dysfunction  Visit Diagnosis: Other idiopathic scoliosis, thoracolumbar region  Unequal leg length  Muscle weakness (generalized)  Other abnormalities of gait and mobility     Problem List Patient Active Problem List   Diagnosis Date Noted  . Vaginal irritation 04/15/2020  . Pelvic pain 04/15/2020  . Bilateral groin pain 03/17/2020  . Angiomyolipoma of left kidney 03/11/2020  . Left lower quadrant abdominal pain 03/04/2020  . Yeast cells and fungal elements present on diagnostic testing 02/11/2020  . Welcome to Medicare preventive visit 03/03/2017  . Estrogen deficiency 03/03/2017  . Borderline low blood pressure determined by examination 10/31/2016  . Vitamin B12 deficiency 10/31/2016  . Chest pain with moderate risk for cardiac etiology 10/17/2014  . Colon cancer screening 02/17/2014  . Other screening mammogram 02/07/2011  . Routine general medical examination at a health care facility 02/01/2011  . Vitamin D deficiency 04/14/2008  . Osteoporosis 01/09/2007  . Hyperlipidemia with target LDL less than 130 01/04/2007  . Atrophic vaginitis 01/04/2007  . SCOLIOSIS 01/04/2007  . CHRONIC FATIGUE SYNDROME 01/04/2007    Jerl Mina ,PT, DPT, E-RYT  10/01/2020, 4:40 PM  [place] MAIN [facility] SERVICES [address] [place], [place], [address] Phone: [phone]   Fax:  [phone]  Name: Tracy [name] MRN: [id number] Date of Birth: 1951/05/14

## 2020-10-08 ENCOUNTER — Other Ambulatory Visit: Payer: Self-pay

## 2020-10-08 ENCOUNTER — Ambulatory Visit: Payer: Medicare HMO | Admitting: Physical Therapy

## 2020-10-08 DIAGNOSIS — M2170 Unequal limb length (acquired), unspecified site: Secondary | ICD-10-CM

## 2020-10-08 DIAGNOSIS — M6281 Muscle weakness (generalized): Secondary | ICD-10-CM | POA: Diagnosis not present

## 2020-10-08 DIAGNOSIS — M4125 Other idiopathic scoliosis, thoracolumbar region: Secondary | ICD-10-CM | POA: Diagnosis not present

## 2020-10-08 DIAGNOSIS — R2689 Other abnormalities of gait and mobility: Secondary | ICD-10-CM | POA: Diagnosis not present

## 2020-10-08 DIAGNOSIS — M533 Sacrococcygeal disorders, not elsewhere classified: Secondary | ICD-10-CM | POA: Diagnosis not present

## 2020-10-09 NOTE — Therapy (Signed)
[place] MAIN [facility] SERVICES [address] [place], [place], [address] Phone: [phone]   Fax:  [phone]  Physical Therapy Treatment  Patient Details  Name: Tracy [name] MRN: [id number] Date of Birth: 06-17-1951 Referring Provider (PT): Wannetta Sender    Encounter Date: 10/08/2020   PT End of Session - 10/08/20 1411    Visit Number 14    Date for PT Re-Evaluation 10/13/20   PN 1/11 #7   Authorization Type ( authorization from 09/22/20 to 10/22/20)    PT Start Time 1407    PT Stop Time 1508    PT Time Calculation (min) 61 min    Activity Tolerance Patient tolerated treatment well    Behavior During Therapy [facility] for tasks assessed/performed           Past Medical History:  Diagnosis Date  . Chronic fatigue syndrome   . Hyperlipidemia   . Osteoporosis     Past Surgical History:  Procedure Laterality Date  . ABDOMINAL HYSTERECTOMY     one ovary removed/ bleeding  . CERVICAL POLYPECTOMY    . DILATION AND CURETTAGE OF UTERUS    . UNILATERAL SALPINGECTOMY Right     There were no vitals filed for this visit.   Subjective Assessment - 10/08/20 1412    Subjective Pt reported feeling sore after last session but it got better. Pt noticed prolapse more this past month as she has been on her feet more with more activities.    Pertinent History Scoliosis, Chronic Fatigue Syndrome,  Physical Activity:  no walking routine.  Hobbies: reading, listen to music.  Daily fluid intake: water 16 fl oz, 1 cup of coffee, 1 cup of tea in evening.  Bowel mvoements occur daily but last couple of weeks, every couple of days.    Patient Stated Goals have no pain                          Pelvic Floor Special Questions - 10/09/20 1049    Pelvic Floor Internal Exam pt consented verbally without contraindications    Exam Type Vaginal    Palpation increased anterior mm, pubic symph attachments, tightness at 6, 9 clock perineal scar  restrictions   post Tx: improved contraction but still not appropriate yet for pelvic floor training   Strength fair squeeze, definite lift   excessive cued for anterior pelvic tilt, pillow under hips, urethra more cranial position post Tx            [facility] Adult PT Treatment/Exercise - 10/09/20 1048      Neuro Re-ed    Neuro Re-ed Details  cued for pelvic floor coordination, pelvic tilt anterior to promote proper pelvic floor contraction      Moist Heat Therapy   Number Minutes Moist Heat 5 Minutes    Moist Heat Location --   perineum, childs pose over bolster     Manual Therapy   Manual therapy comments STM/MWM at anterior mm , pubic symphysis, t 6, 9 clock perineal scar releases                       PT Long Term Goals - 10/08/20 1417      PT LONG TERM GOAL #1   Title Pt will report not having to move seatbelt away from L hip across 3 car rides in order to minimize L pubic/ hip pain and to travel    Time  6    Period Weeks    Status Achieved      PT LONG TERM GOAL #2   Title Pt will decrease her Pelvic pain on FOTO from 46 pts to < 36 pts,  PFDI Urinary 21pts to < 11 pts, PFDI Prolapse 33 pts to < 23 pts in order to restore QOL and ADLs   ( reassessment 08/04/20 Pain: 4pts  , Urinary   4 pts   ,  Prolapse 4pts    )    Time 10    Period Weeks    Status Achieved      PT LONG TERM GOAL #3   Title Pt will be IND with scoliosis specific HEP in order to minimize worsening of mm/ spinal/ pelvic imbalances and minimize risk for injuries    Time 10    Period Weeks    Status Achieved      PT LONG TERM GOAL #4   Title Pt will demo IND with deep core and pelvic floor coordination in order to progress to strengtening    Time 2    Period Weeks    Status Achieved      PT LONG TERM GOAL #5   Title Pt will report daily bowel movements ( previously occuring every 3-4 days) in order to restore pelvic floor function    Time 8    Period Weeks    Status Achieved      PT  LONG TERM GOAL #6   Title Pt will report no pubic pain with radiating L hip pain across 2 weeks in order to walk and perform ADLs and minimize further narrowing of hips/ minimize risk for Fx 2/2 osteoposis, minimize worsening of scoliosis  ( 1/4: reported no pain for 1 week   1/11: reported no pain  08/31/20: week after scoliosis thorax shift was performed and reported no pain for 4 days  )    Time 6    Period Weeks    Status Partially Met      PT LONG TERM GOAL #7   Title Pt will report she is able to get to the bathroom before leakage 100% of the time in order to be out in the community    Time 8    Period Weeks    Status Achieved      PT LONG TERM GOAL #8   Title Pt will demo no pelvic floor mm tightness and proper lengthening and upward movement of pelvic floor with deep core mm in order to minmize worsening of prolapse, burning, and pain and improve QOL    Time 8    Period Weeks    Status Partially Met      PT LONG TERM GOAL  #9   TITLE Pt will notice improved prolapse ( noticing bladder protruding from 2 x across 2 months to 1 x across 2 months) in order to improve QOL    Time 8    Period Weeks    Status On-going                 Plan - 10/08/20 1411    Clinical Impression Statement Pt required further manual Tx to minimize pelvic floor mm tightness. Pt required cues for more anterior tilt of pelvic floor to promote more complete closure of pelvic floor mm. Pt will be ready for pelvic floor strengthening at next session. Pt continues to benefit from skilled PT.    Examination-Activity Limitations Toileting;Sit    Stability/Clinical Decision  Making Evolving/Moderate complexity    Rehab Potential Good    PT Frequency 1x / week    PT Duration Other (comment)   10   PT Treatment/Interventions Neuromuscular re-education;Stair training;Moist Heat;Therapeutic activities;Patient/family education;Therapeutic exercise;Cryotherapy;Manual techniques;Taping;Scar mobilization     Consulted and Agree with Plan of Care Patient           Patient will benefit from skilled therapeutic intervention in order to improve the following deficits and impairments:  Abnormal gait,Decreased mobility,Decreased coordination,Decreased endurance,Decreased activity tolerance,Decreased balance,Decreased strength,Increased muscle spasms,Decreased scar mobility,Postural dysfunction  Visit Diagnosis: Other idiopathic scoliosis, thoracolumbar region  Unequal leg length  Muscle weakness (generalized)  Other abnormalities of gait and mobility     Problem List Patient Active Problem List   Diagnosis Date Noted  . Vaginal irritation 04/15/2020  . Pelvic pain 04/15/2020  . Bilateral groin pain 03/17/2020  . Angiomyolipoma of left kidney 03/11/2020  . Left lower quadrant abdominal pain 03/04/2020  . Yeast cells and fungal elements present on diagnostic testing 02/11/2020  . Welcome to Medicare preventive visit 03/03/2017  . Estrogen deficiency 03/03/2017  . Borderline low blood pressure determined by examination 10/31/2016  . Vitamin B12 deficiency 10/31/2016  . Chest pain with moderate risk for cardiac etiology 10/17/2014  . Colon cancer screening 02/17/2014  . Other screening mammogram 02/07/2011  . Routine general medical examination at a health care facility 02/01/2011  . Vitamin D deficiency 04/14/2008  . Osteoporosis 01/09/2007  . Hyperlipidemia with target LDL less than 130 01/04/2007  . Atrophic vaginitis 01/04/2007  . SCOLIOSIS 01/04/2007  . CHRONIC FATIGUE SYNDROME 01/04/2007    Jerl Mina ,PT, DPT, E-RYT  10/09/2020, 10:55 AM  [place] MAIN [facility] SERVICES [address] [place], [place], [address] Phone: [phone]   Fax:  [phone]  Name: Tracy [name] MRN: [id number] Date of Birth: 06-May-1951

## 2020-10-09 NOTE — Patient Instructions (Signed)
Anterior tilt of pelvic floor   Continue with pelvic floor stretches

## 2020-10-15 ENCOUNTER — Other Ambulatory Visit: Payer: Self-pay

## 2020-10-15 ENCOUNTER — Ambulatory Visit: Payer: Medicare HMO | Admitting: Physical Therapy

## 2020-10-15 DIAGNOSIS — M4125 Other idiopathic scoliosis, thoracolumbar region: Secondary | ICD-10-CM

## 2020-10-15 DIAGNOSIS — M2170 Unequal limb length (acquired), unspecified site: Secondary | ICD-10-CM

## 2020-10-15 DIAGNOSIS — R2689 Other abnormalities of gait and mobility: Secondary | ICD-10-CM

## 2020-10-15 DIAGNOSIS — M6281 Muscle weakness (generalized): Secondary | ICD-10-CM

## 2020-10-15 DIAGNOSIS — M533 Sacrococcygeal disorders, not elsewhere classified: Secondary | ICD-10-CM | POA: Diagnosis not present

## 2020-10-15 NOTE — Patient Instructions (Signed)
L foot propped on step with feet hip width apart Adductor stretch  Add 5 quick contractions with deep core level 1

## 2020-10-15 NOTE — Therapy (Addendum)
[place] MAIN [facility] SERVICES [address] [place], [place], [address] Phone: [phone]   Fax:  [phone]  Physical Therapy Treatment  Patient Details  Name: Tracy [name] MRN: [id number] Date of Birth: [birth year] Referring Provider (PT): Wannetta Sender    Encounter Date: 10/15/2020   PT End of Session - 10/15/20 1638    Visit Number 15    Date for PT Re-Evaluation 12/10/20   PN 1/11 #7   Authorization Type ( authorization from 09/22/20 to 10/22/20)    PT Start Time 1402    PT Stop Time 1500    PT Time Calculation (min) 58 min    Activity Tolerance Patient tolerated treatment well    Behavior During Therapy [facility] for tasks assessed/performed           Past Medical History:  Diagnosis Date  . Chronic fatigue syndrome   . Hyperlipidemia   . Osteoporosis     Past Surgical History:  Procedure Laterality Date  . ABDOMINAL HYSTERECTOMY     one ovary removed/ bleeding  . CERVICAL POLYPECTOMY    . DILATION AND CURETTAGE OF UTERUS    . UNILATERAL SALPINGECTOMY Right     There were no vitals filed for this visit.   Subjective Assessment - 10/15/20 1413    Subjective Pt reported she enjoys her childs pose exercises.    Pertinent History Scoliosis, Chronic Fatigue Syndrome,  Physical Activity:  no walking routine.  Hobbies: reading, listen to music.  Daily fluid intake: water 16 fl oz, 1 cup of coffee, 1 cup of tea in evening.  Bowel mvoements occur daily but last couple of weeks, every couple of days.    Patient Stated Goals have no pain                          Pelvic Floor Special Questions - 10/15/20 1759    Pelvic Floor Internal Exam pt consented verbally without contraindications    Exam Type Vaginal    Palpation increased L anterior mm, urethrae compressae    Strength fair squeeze, definite lift , no pillow required under hips, required cues for no adductor overuse       Strength # of reps 5    Strength # of  seconds 1             [facility] Adult PT Treatment/Exercise - 10/15/20 1800      Neuro Re-ed    Neuro Re-ed Details  cued for pelvic floor coordination, pelvic tilt anterior to promote proper pelvic floor contraction   Adductor stretch for L cues and alignment required      Moist Heat Therapy   Number Minutes Moist Heat 5 Minutes    Moist Heat Location Other (comment)   perineum     Manual Therapy   Internal Pelvic Floor STM/MWM to address L anterior pelvic floor mm                       PT Long Term Goals - 10/08/20 1417      PT LONG TERM GOAL #1   Title Pt will report not having to move seatbelt away from L hip across 3 car rides in order to minimize L pubic/ hip pain and to travel    Time 6    Period Weeks    Status Achieved      PT LONG TERM GOAL #2   Title Pt will decrease  her Pelvic pain on FOTO from 46 pts to < 36 pts,  PFDI Urinary 21pts to < 11 pts, PFDI Prolapse 33 pts to < 23 pts in order to restore QOL and ADLs   ( reassessment 08/04/20 Pain: 4pts  , Urinary   4 pts   ,  Prolapse 4pts    )    Time 10    Period Weeks    Status Achieved      PT LONG TERM GOAL #3   Title Pt will be IND with scoliosis specific HEP in order to minimize worsening of mm/ spinal/ pelvic imbalances and minimize risk for injuries    Time 10    Period Weeks    Status Achieved      PT LONG TERM GOAL #4   Title Pt will demo IND with deep core and pelvic floor coordination in order to progress to strengtening    Time 2    Period Weeks    Status Achieved      PT LONG TERM GOAL #5   Title Pt will report daily bowel movements ( previously occuring every 3-4 days) in order to restore pelvic floor function    Time 8    Period Weeks    Status Achieved      PT LONG TERM GOAL #6   Title Pt will report no pubic pain with radiating L hip pain across 2 weeks in order to walk and perform ADLs and minimize further narrowing of hips/ minimize risk for Fx 2/2 osteoposis, minimize  worsening of scoliosis  ( 1/4: reported no pain for 1 week   1/11: reported no pain  08/31/20: week after scoliosis thorax shift was performed and reported no pain for 4 days  )    Time 6    Period Weeks    Status Partially Met      PT LONG TERM GOAL #7   Title Pt will report she is able to get to the bathroom before leakage 100% of the time in order to be out in the community    Time 8    Period Weeks    Status Achieved      PT LONG TERM GOAL #8   Title Pt will demo no pelvic floor mm tightness and proper lengthening and upward movement of pelvic floor with deep core mm in order to minmize worsening of prolapse, burning, and pain and improve QOL    Time 8    Period Weeks    Status Partially Met      PT LONG TERM GOAL  #9   TITLE Pt will notice improved prolapse ( noticing bladder protruding from 2 x across 2 months to 1 x across 2 months) in order to improve QOL    Time 8    Period Weeks    Status On-going                 Plan - 10/15/20 1801    Clinical Impression Statement Pt shoqwed good carry over with no cues requried for anterior tilt, no pillows required for closure of pelvic floor. Advanced to 5 quick pelvic floor contractions today after pt required manual Tx to decrease pelvic floor mm tightenss at L side and cues to minimize adductor overuse. Pt continues to benefit from skilled PT    Examination-Activity Limitations Toileting;Sit    Stability/Clinical Decision Making Evolving/Moderate complexity    Rehab Potential Good    PT Frequency 1x / week  PT Duration Other (comment)   10   PT Treatment/Interventions Neuromuscular re-education;Stair training;Moist Heat;Therapeutic activities;Patient/family education;Therapeutic exercise;Cryotherapy;Manual techniques;Taping;Scar mobilization    Consulted and Agree with Plan of Care Patient           Patient will benefit from skilled therapeutic intervention in order to improve the following deficits and impairments:   Abnormal gait,Decreased mobility,Decreased coordination,Decreased endurance,Decreased activity tolerance,Decreased balance,Decreased strength,Increased muscle spasms,Decreased scar mobility,Postural dysfunction  Visit Diagnosis: Other idiopathic scoliosis, thoracolumbar region  Unequal leg length  Muscle weakness (generalized)  Other abnormalities of gait and mobility     Problem List Patient Active Problem List   Diagnosis Date Noted  . Vaginal irritation 04/15/2020  . Pelvic pain 04/15/2020  . Bilateral groin pain 03/17/2020  . Angiomyolipoma of left kidney 03/11/2020  . Left lower quadrant abdominal pain 03/04/2020  . Yeast cells and fungal elements present on diagnostic testing 02/11/2020  . Welcome to Medicare preventive visit 03/03/2017  . Estrogen deficiency 03/03/2017  . Borderline low blood pressure determined by examination 10/31/2016  . Vitamin B12 deficiency 10/31/2016  . Chest pain with moderate risk for cardiac etiology 10/17/2014  . Colon cancer screening 02/17/2014  . Other screening mammogram 02/07/2011  . Routine general medical examination at a health care facility 02/01/2011  . Vitamin D deficiency 04/14/2008  . Osteoporosis 01/09/2007  . Hyperlipidemia with target LDL less than 130 01/04/2007  . Atrophic vaginitis 01/04/2007  . SCOLIOSIS 01/04/2007  . CHRONIC FATIGUE SYNDROME 01/04/2007    Jerl Mina 10/15/2020, 6:21 PM  [place] MAIN [facility] SERVICES [address] [place], [place], [address] Phone: [phone]   Fax:  [phone]  Name: Tracy [name] MRN: [id number] Date of Birth: 1950/09/08

## 2020-10-22 ENCOUNTER — Ambulatory Visit: Payer: Medicare HMO | Admitting: Physical Therapy

## 2020-10-22 ENCOUNTER — Other Ambulatory Visit: Payer: Self-pay

## 2020-10-22 DIAGNOSIS — M6281 Muscle weakness (generalized): Secondary | ICD-10-CM

## 2020-10-22 DIAGNOSIS — R2689 Other abnormalities of gait and mobility: Secondary | ICD-10-CM | POA: Diagnosis not present

## 2020-10-22 DIAGNOSIS — M2170 Unequal limb length (acquired), unspecified site: Secondary | ICD-10-CM

## 2020-10-22 DIAGNOSIS — M4125 Other idiopathic scoliosis, thoracolumbar region: Secondary | ICD-10-CM

## 2020-10-22 DIAGNOSIS — M533 Sacrococcygeal disorders, not elsewhere classified: Secondary | ICD-10-CM | POA: Diagnosis not present

## 2020-10-22 NOTE — Patient Instructions (Signed)
Squat   Feet shoulder width apart, a few inches in front of wall, lean against wall by squeezing shoulder blades together and back of head pressed against the wall. Inhale as you lower by bending hips and knees just enough that you can still see your toes.  Exhale, engage deep core muscles and press into feet  as you rise up to stand. Repeat _10___ times.     Dolphin plank with fists together Against wall Mini squat,  Push feet into ground as you exhale to rise up Chin tucked the whole time 10 reps

## 2020-10-22 NOTE — Therapy (Signed)
[place] [facility] SERVICES [address] [place], [place], [address] Phone: [phone]   Fax:  [phone]  Physical Therapy Treatment  Patient Details  Name: Tracy [name] MRN: [id number] Date of Birth: 1950-08-29 Referring Provider (PT): Wannetta Sender    Encounter Date: 10/22/2020   PT End of Session - 10/22/20 1415    Visit Number 16    Date for PT Re-Evaluation 12/10/20   PN 1/11 #7   Authorization Type ( authorization from 4 visits from 11/03/20 to 12/01/20  )    PT Start Time 1405    PT Stop Time 1500    PT Time Calculation (min) 55 min    Activity Tolerance Patient tolerated treatment well    Behavior During Therapy [facility] for tasks assessed/performed           Past Medical History:  Diagnosis Date  . Chronic fatigue syndrome   . Hyperlipidemia   . Osteoporosis     Past Surgical History:  Procedure Laterality Date  . ABDOMINAL HYSTERECTOMY     one ovary removed/ bleeding  . CERVICAL POLYPECTOMY    . DILATION AND CURETTAGE OF UTERUS    . UNILATERAL SALPINGECTOMY Right     There were no vitals filed for this visit.   Subjective Assessment - 10/22/20 1412    Subjective Pt reported she is noticing a lift to her pelvic floor    Pertinent History Scoliosis, Chronic Fatigue Syndrome,  Physical Activity:  no walking routine.  Hobbies: reading, listen to music.  Daily fluid intake: water 16 fl oz, 1 cup of coffee, 1 cup of tea in evening.  Bowel mvoements occur daily but last couple of weeks, every couple of days.    Patient Stated Goals have no pain              [facility] PT Assessment - 10/22/20 1813      Other:   Other/Comments overuse of traps in wall dolphin plank mini squat exericise, poor propioception with wall squats                      Pelvic Floor Special Questions - 10/22/20 1812    Pelvic Floor Internal Exam pt consented verbally without contraindications    Exam Type Vaginal    Palpation  increased L anterior mm   post Tx: improved contraction but still not appropriate yet for pelvic floor training   Strength fair squeeze, definite lift   cued for  anterior tilt, no pillows required for closure of mm   Strength # of reps 5    Strength # of seconds 1             [facility] Adult PT Treatment/Exercise - 10/22/20 1811      Neuro Re-ed    Neuro Re-ed Details  cued for functional positional with co-activation of pelvic floor mm      Moist Heat Therapy   Number Minutes Moist Heat 5 Minutes    Moist Heat Location --   perineum     Manual Therapy   Internal Pelvic Floor STM/MWM to address L anterior pelvic floor mm                       PT Long Term Goals - 10/08/20 1417      PT LONG TERM GOAL #1   Title Pt will report not having to move seatbelt away from L hip across 3 car rides  in order to minimize L pubic/ hip pain and to travel    Time 6    Period Weeks    Status Achieved      PT LONG TERM GOAL #2   Title Pt will decrease her Pelvic pain on FOTO from 46 pts to < 36 pts,  PFDI Urinary 21pts to < 11 pts, PFDI Prolapse 33 pts to < 23 pts in order to restore QOL and ADLs   ( reassessment 08/04/20 Pain: 4pts  , Urinary   4 pts   ,  Prolapse 4pts    )    Time 10    Period Weeks    Status Achieved      PT LONG TERM GOAL #3   Title Pt will be IND with scoliosis specific HEP in order to minimize worsening of mm/ spinal/ pelvic imbalances and minimize risk for injuries    Time 10    Period Weeks    Status Achieved      PT LONG TERM GOAL #4   Title Pt will demo IND with deep core and pelvic floor coordination in order to progress to strengtening    Time 2    Period Weeks    Status Achieved      PT LONG TERM GOAL #5   Title Pt will report daily bowel movements ( previously occuring every 3-4 days) in order to restore pelvic floor function    Time 8    Period Weeks    Status Achieved      PT LONG TERM GOAL #6   Title Pt will report no pubic pain with  radiating L hip pain across 2 weeks in order to walk and perform ADLs and minimize further narrowing of hips/ minimize risk for Fx 2/2 osteoposis, minimize worsening of scoliosis  ( 1/4: reported no pain for 1 week   1/11: reported no pain  08/31/20: week after scoliosis thorax shift was performed and reported no pain for 4 days  )    Time 6    Period Weeks    Status Partially Met      PT LONG TERM GOAL #7   Title Pt will report she is able to get to the bathroom before leakage 100% of the time in order to be out in the community    Time 8    Period Weeks    Status Achieved      PT LONG TERM GOAL #8   Title Pt will demo no pelvic floor mm tightness and proper lengthening and upward movement of pelvic floor with deep core mm in order to minmize worsening of prolapse, burning, and pain and improve QOL    Time 8    Period Weeks    Status Partially Met      PT LONG TERM GOAL  #9   TITLE Pt will notice improved prolapse ( noticing bladder protruding from 2 x across 2 months to 1 x across 2 months) in order to improve QOL    Time 8    Period Weeks    Status On-going                 Plan - 10/22/20 1416    Clinical Impression Statement Pt demo'd less pelvic floor tightness with remaining tensions at L side which improved post Tx. Pt progressed to functional position of exercises to co-activate pelvic floor. Pt continues to benefit from skilled PT    Examination-Activity Limitations Toileting;Sit    Stability/Clinical  Decision Making Evolving/Moderate complexity    Rehab Potential Good    PT Frequency 1x / week    PT Duration Other (comment)   10   PT Treatment/Interventions Neuromuscular re-education;Stair training;Moist Heat;Therapeutic activities;Patient/family education;Therapeutic exercise;Cryotherapy;Manual techniques;Taping;Scar mobilization    Consulted and Agree with Plan of Care Patient           Patient will benefit from skilled therapeutic intervention in order to  improve the following deficits and impairments:  Abnormal gait,Decreased mobility,Decreased coordination,Decreased endurance,Decreased activity tolerance,Decreased balance,Decreased strength,Increased muscle spasms,Decreased scar mobility,Postural dysfunction  Visit Diagnosis: Other idiopathic scoliosis, thoracolumbar region  Unequal leg length  Muscle weakness (generalized)  Other abnormalities of gait and mobility     Problem List Patient Active Problem List   Diagnosis Date Noted  . Vaginal irritation 04/15/2020  . Pelvic pain 04/15/2020  . Bilateral groin pain 03/17/2020  . Angiomyolipoma of left kidney 03/11/2020  . Left lower quadrant abdominal pain 03/04/2020  . Yeast cells and fungal elements present on diagnostic testing 02/11/2020  . Welcome to Medicare preventive visit 03/03/2017  . Estrogen deficiency 03/03/2017  . Borderline low blood pressure determined by examination 10/31/2016  . Vitamin B12 deficiency 10/31/2016  . Chest pain with moderate risk for cardiac etiology 10/17/2014  . Colon cancer screening 02/17/2014  . Other screening mammogram 02/07/2011  . Routine general medical examination at a health care facility 02/01/2011  . Vitamin D deficiency 04/14/2008  . Osteoporosis 01/09/2007  . Hyperlipidemia with target LDL less than 130 01/04/2007  . Atrophic vaginitis 01/04/2007  . SCOLIOSIS 01/04/2007  . CHRONIC FATIGUE SYNDROME 01/04/2007    Jerl Mina ,PT, DPT, E-RYT  10/22/2020, 6:16 PM  [place] [facility] SERVICES [address] [place], [place], [address] Phone: [phone]   Fax:  [phone]  Name: Tracy [name] MRN: [id number] Date of Birth: 02-14-1951

## 2020-11-03 ENCOUNTER — Other Ambulatory Visit: Payer: Self-pay

## 2020-11-03 ENCOUNTER — Ambulatory Visit: Payer: Medicare HMO | Attending: Obstetrics and Gynecology | Admitting: Physical Therapy

## 2020-11-03 DIAGNOSIS — M4125 Other idiopathic scoliosis, thoracolumbar region: Secondary | ICD-10-CM | POA: Diagnosis present

## 2020-11-03 DIAGNOSIS — M6281 Muscle weakness (generalized): Secondary | ICD-10-CM | POA: Diagnosis present

## 2020-11-03 DIAGNOSIS — M2170 Unequal limb length (acquired), unspecified site: Secondary | ICD-10-CM | POA: Diagnosis present

## 2020-11-03 DIAGNOSIS — R2689 Other abnormalities of gait and mobility: Secondary | ICD-10-CM | POA: Diagnosis present

## 2020-11-03 NOTE — Patient Instructions (Signed)
Practice proper pelvic floor coordination  Inhale: expand pelvic floor muscles Exhale" "j" scoop, allow pelvic floor to close, lift first before belly sinks   ( not "draw abdominal muscle to spine" or strain with abdominal muscles")   __   Wide on inhale,  "keep crown" on head" on exhale to avoid slumping and using ab muscles

## 2020-11-03 NOTE — Therapy (Signed)
[place] MAIN [facility] SERVICES [address] [place], [place], [address] Phone: [phone]   Fax:  [phone]  Physical Therapy Treatment  Patient Details  Name: Tracy [name] MRN: [id number] Date of Birth: 1951-02-13 Referring Provider (PT): Wannetta Sender    Encounter Date: 11/03/2020   PT End of Session - 11/03/20 1831    Visit Number 17    Date for PT Re-Evaluation 12/10/20   PN 1/11 #7   Authorization Type ( authorization from 4 visits from 11/03/20 to 12/01/20  )    PT Start Time 1530    PT Stop Time 1640    PT Time Calculation (min) 70 min    Activity Tolerance Patient tolerated treatment well    Behavior During Therapy [facility] for tasks assessed/performed           Past Medical History:  Diagnosis Date  . Chronic fatigue syndrome   . Hyperlipidemia   . Osteoporosis     Past Surgical History:  Procedure Laterality Date  . ABDOMINAL HYSTERECTOMY     one ovary removed/ bleeding  . CERVICAL POLYPECTOMY    . DILATION AND CURETTAGE OF UTERUS    . UNILATERAL SALPINGECTOMY Right     There were no vitals filed for this visit.   Subjective Assessment - 11/03/20 1537    Subjective Pt reported she notices her prolapse is worst with daily activities. Pt feels tired today. Pt had wrist pain that lasted for days with use of the red band    Pertinent History Scoliosis, Chronic Fatigue Syndrome,  Physical Activity:  no walking routine.  Hobbies: reading, listen to music.  Daily fluid intake: water 16 fl oz, 1 cup of coffee, 1 cup of tea in evening.  Bowel mvoements occur daily but last couple of weeks, every couple of days.    Patient Stated Goals have no pain              [facility] PT Assessment - 11/03/20 1833      Coordination   Coordination and Movement Description ab overuse with inhalation/ exhalation  in seated position and hooklying                      Pelvic Floor Special Questions - 11/03/20 1832    Pelvic  Floor Internal Exam pt consented verbally without contraindications    Exam Type Vaginal    Palpation decreased L anterior mm, lowered bladder in hooklying and standing    Strength fair squeeze, definite lift   cued for  anterior tilt, no pillows required for closure of m   Strength # of reps 4    Strength # of seconds 3             [facility] Adult PT Treatment/Exercise - 11/03/20 1834      Therapeutic Activites    Other Therapeutic Activities modified red band with handles to minimize wrist pain with band exercise      Neuro Re-ed    Neuro Re-ed Details  excessive cues for less ab straining in seated and hooklying positions with deep core      Manual Therapy   Internal Pelvic Floor tactile cues for proper p elvic floor contractions , endurance training                       PT Long Term Goals - 10/08/20 1417      PT LONG TERM GOAL #1   Title  Pt will report not having to move seatbelt away from L hip across 3 car rides in order to minimize L pubic/ hip pain and to travel    Time 6    Period Weeks    Status Achieved      PT LONG TERM GOAL #2   Title Pt will decrease her Pelvic pain on FOTO from 46 pts to < 36 pts,  PFDI Urinary 21pts to < 11 pts, PFDI Prolapse 33 pts to < 23 pts in order to restore QOL and ADLs   ( reassessment 08/04/20 Pain: 4pts  , Urinary   4 pts   ,  Prolapse 4pts    )    Time 10    Period Weeks    Status Achieved      PT LONG TERM GOAL #3   Title Pt will be IND with scoliosis specific HEP in order to minimize worsening of mm/ spinal/ pelvic imbalances and minimize risk for injuries    Time 10    Period Weeks    Status Achieved      PT LONG TERM GOAL #4   Title Pt will demo IND with deep core and pelvic floor coordination in order to progress to strengtening    Time 2    Period Weeks    Status Achieved      PT LONG TERM GOAL #5   Title Pt will report daily bowel movements ( previously occuring every 3-4 days) in order to restore pelvic  floor function    Time 8    Period Weeks    Status Achieved      PT LONG TERM GOAL #6   Title Pt will report no pubic pain with radiating L hip pain across 2 weeks in order to walk and perform ADLs and minimize further narrowing of hips/ minimize risk for Fx 2/2 osteoposis, minimize worsening of scoliosis  ( 1/4: reported no pain for 1 week   1/11: reported no pain  08/31/20: week after scoliosis thorax shift was performed and reported no pain for 4 days  )    Time 6    Period Weeks    Status Partially Met      PT LONG TERM GOAL #7   Title Pt will report she is able to get to the bathroom before leakage 100% of the time in order to be out in the community    Time 8    Period Weeks    Status Achieved      PT LONG TERM GOAL #8   Title Pt will demo no pelvic floor mm tightness and proper lengthening and upward movement of pelvic floor with deep core mm in order to minmize worsening of prolapse, burning, and pain and improve QOL    Time 8    Period Weeks    Status Partially Met      PT LONG TERM GOAL  #9   TITLE Pt will notice improved prolapse ( noticing bladder protruding from 2 x across 2 months to 1 x across 2 months) in order to improve QOL    Time 8    Period Weeks    Status On-going                 Plan - 11/03/20 1832    Clinical Impression Statement Pt demo'd significantly less pelvic floor tightness and advanced to endurance contraction to promote more cranial position of bladder. However, pt continued to requried excessive verbal and tactile  and visual cues for less ab overuse with exhalation and optimal diaphragmatic excursion. Pt advanced to seated position with breathing coordination to promote co- activation of pelvic floor which will help pt minimize downward strain to pelvic organ to help with prolapse. Pt continues to benefit from skilled PT    Examination-Activity Limitations Toileting;Sit    Stability/Clinical Decision Making Evolving/Moderate complexity     Rehab Potential Good    PT Frequency 1x / week    PT Duration Other (comment)   10   PT Treatment/Interventions Neuromuscular re-education;Stair training;Moist Heat;Therapeutic activities;Patient/family education;Therapeutic exercise;Cryotherapy;Manual techniques;Taping;Scar mobilization    Consulted and Agree with Plan of Care Patient           Patient will benefit from skilled therapeutic intervention in order to improve the following deficits and impairments:  Abnormal gait,Decreased mobility,Decreased coordination,Decreased endurance,Decreased activity tolerance,Decreased balance,Decreased strength,Increased muscle spasms,Decreased scar mobility,Postural dysfunction  Visit Diagnosis: No diagnosis found.     Problem List Patient Active Problem List   Diagnosis Date Noted  . Vaginal irritation 04/15/2020  . Pelvic pain 04/15/2020  . Bilateral groin pain 03/17/2020  . Angiomyolipoma of left kidney 03/11/2020  . Left lower quadrant abdominal pain 03/04/2020  . Yeast cells and fungal elements present on diagnostic testing 02/11/2020  . Welcome to Medicare preventive visit 03/03/2017  . Estrogen deficiency 03/03/2017  . Borderline low blood pressure determined by examination 10/31/2016  . Vitamin B12 deficiency 10/31/2016  . Chest pain with moderate risk for cardiac etiology 10/17/2014  . Colon cancer screening 02/17/2014  . Other screening mammogram 02/07/2011  . Routine general medical examination at a health care facility 02/01/2011  . Vitamin D deficiency 04/14/2008  . Osteoporosis 01/09/2007  . Hyperlipidemia with target LDL less than 130 01/04/2007  . Atrophic vaginitis 01/04/2007  . SCOLIOSIS 01/04/2007  . CHRONIC FATIGUE SYNDROME 01/04/2007    Jerl Mina ,PT, DPT, E-RYT  11/03/2020, 6:37 PM  [place] MAIN [facility] SERVICES [address] [place], [place], [address] Phone: [phone]   Fax:  [phone]  Name: Tracy [name] MRN: [id number] Date of Birth: January 30, 1951

## 2020-11-10 NOTE — Progress Notes (Signed)
Tracy [name] Return Visit  SUBJECTIVE  History of Present Illness: Tracy [name] is a 70 y.o. female seen in follow-up for levator spasm and vaginal atrophy,  and prolapse.  Has more irritation with standing throughout the day. Maybe prolapse is rubbing on her underwear. She feels that it is bulging out more.   Is using vitamin E oil, and that has been helping with her symptoms of burning. Sometimes has to use it more often when irritated.   Pain is drastically improved with physical therapy.    Past Medical History: Patient  has a past medical history of Chronic fatigue syndrome, Hyperlipidemia, and Osteoporosis.   Past Surgical History: She  has a past surgical history that includes Abdominal hysterectomy; Dilation and curettage of uterus; Cervical polypectomy; and Unilateral salpingectomy (Right).   Medications: She has a current medication list which includes the following prescription(s): calcium-vitamin d-vitamin k, vitamin d, cyanocobalamin, diazepam, estradiol, raloxifene, simvastatin, and triamcinolone cream.   Allergies: Patient is allergic to alendronate sodium, amoxicillin, ibuprofen, and ivp dye [iodinated diagnostic agents].   Social History: Patient  reports that she has never smoked. She has never used smokeless tobacco. She reports that she does not drink alcohol and does not use drugs.      OBJECTIVE     Physical Exam: Vitals:   11/11/20 1028  BP: 114/66  Pulse: 78  Weight: 112 lb (50.8 kg)  Height: 5\' 2"  (1.575 m)   Gen: No apparent distress, A&O x 3.  Detailed Urogynecologic Evaluation:  Deferred.   POP-Q  0                                            Aa   0                                           Ba  -7                                              C   2.5                                            Gh  3                                            Pb  8                                            tvl   -2                                             Ap  -2  Bp                                                 D       ASSESSMENT AND PLAN    Tracy [name] is a 70 y.o. with:  1. Prolapse of anterior vaginal wall   2. Vaginal atrophy   3. Levator spasm    1. Stage II anterior, Stage I posterior, Stage I apical prolapse - Noted increased prolapse of the anterior vaginal wall from initial exam. We discussed both conservative treatment with a pessary or surgery. She is concerned about the pessary since she has issues with tampons and placing things in the vagina in the past. She would like to proceed with surgery- anterior repair.  - Recommended that she undergo urodynamic testing for occult incontinence. Has had some issues with urinary frequency in the past but not currently having leakage  2. Vaginal atrophy - continue vitamin E cream  3. Levator/ muscle spasm - continue with PT and exercises as they have been helpful for her.   Return for urodynamic testing.    [name], MD   Time spent: I spent 25 minutes dedicated to the care of this patient on the date of this encounter to include pre-visit review of records, face-to-face time with the patient and post visit documentation.

## 2020-11-11 ENCOUNTER — Ambulatory Visit (INDEPENDENT_AMBULATORY_CARE_PROVIDER_SITE_OTHER): Payer: Medicare HMO | Admitting: Obstetrics and Gynecology

## 2020-11-11 ENCOUNTER — Encounter: Payer: Self-pay | Admitting: Obstetrics and Gynecology

## 2020-11-11 ENCOUNTER — Other Ambulatory Visit: Payer: Self-pay

## 2020-11-11 VITALS — BP 114/66 | HR 78 | Ht 62.0 in | Wt 112.0 lb

## 2020-11-11 DIAGNOSIS — N8110 Cystocele, unspecified: Secondary | ICD-10-CM

## 2020-11-11 DIAGNOSIS — N952 Postmenopausal atrophic vaginitis: Secondary | ICD-10-CM

## 2020-11-11 DIAGNOSIS — M62838 Other muscle spasm: Secondary | ICD-10-CM

## 2020-11-16 ENCOUNTER — Ambulatory Visit: Payer: Medicare HMO | Admitting: Physical Therapy

## 2020-11-16 ENCOUNTER — Other Ambulatory Visit: Payer: Self-pay

## 2020-11-16 DIAGNOSIS — M6281 Muscle weakness (generalized): Secondary | ICD-10-CM

## 2020-11-16 DIAGNOSIS — M4125 Other idiopathic scoliosis, thoracolumbar region: Secondary | ICD-10-CM

## 2020-11-16 DIAGNOSIS — R2689 Other abnormalities of gait and mobility: Secondary | ICD-10-CM

## 2020-11-16 DIAGNOSIS — M2170 Unequal limb length (acquired), unspecified site: Secondary | ICD-10-CM | POA: Diagnosis not present

## 2020-11-17 NOTE — Therapy (Addendum)
[place] MAIN [facility] SERVICES [address] [place], [place], [address] Phone: [phone]   Fax:  [phone]  Physical Therapy Treatment /Discharge Summary   Patient Details  Name: Tracy [name] MRN: [id number] Date of Birth: Dec 13, 1950 Referring Provider (PT): Wannetta Sender    Encounter Date: 11/16/2020   PT End of Session - 11/16/20 1533    Visit Number 18    Date for PT Re-Evaluation 12/10/20   PN 1/11 #7   Authorization Type ( authorization from 5 visits from 11/03/20 to 12/01/20  )    PT Start Time 1500    PT Stop Time 1600    PT Time Calculation (min) 60 min    Activity Tolerance Patient tolerated treatment well    Behavior During Therapy [facility] for tasks assessed/performed           Past Medical History:  Diagnosis Date  . Chronic fatigue syndrome   . Hyperlipidemia   . Osteoporosis     Past Surgical History:  Procedure Laterality Date  . ABDOMINAL HYSTERECTOMY     one ovary removed/ bleeding  . CERVICAL POLYPECTOMY    . DILATION AND CURETTAGE OF UTERUS    . UNILATERAL SALPINGECTOMY Right     There were no vitals filed for this visit.   Subjective Assessment - 11/16/20 1515    Subjective Pt consulted Dr. Windy Canny and was told that her prolapse is lower than before and pt is considering surgery. Pt has been feeling fatigued the past weeks. Pt has Hx of chronic fatigue after vaccines ( flu shot, COVID shots) . husband says she snores. She gets up 1-3 x night to pee.    Pertinent History Scoliosis, Chronic Fatigue Syndrome,  Physical Activity:  no walking routine.  Hobbies: reading, listen to music.  Daily fluid intake: water 16 fl oz, 1 cup of coffee, 1 cup of tea in evening.  Bowel mvoements occur daily but last couple of weeks, every couple of days.    Patient Stated Goals have no pain              [facility] PT Assessment - 11/17/20 0809      Palpation   Palpation comment 3 fingers width between rib and iliacc rest  bilaterally, no spinal shift , levelled shoulders                      Pelvic Floor Special Questions - 11/17/20 0804    External Perineal Exam ablke to distinguish incorrect form             [facility] Adult PT Treatment/Exercise - 11/17/20 0819      Therapeutic Activites    Other Therapeutic Activities active listening about worsened prolapse and her consideration for surgery,  her current chronic fatigue episode, inquired about nocturia, fatigue, and diascussed risk factors for OSA, provided research and advised pt to f/u with PCP on sleep study to rule in or out OSA given nocturia and fatigue Sx      Neuro Re-ed    Neuro Re-ed Details  cued for pelvic floor contractions and band exercise with less ab overuse                       PT Long Term Goals - 11/16/20 1553      PT LONG TERM GOAL #1   Title Pt will report not having to move seatbelt away from L hip across 3 car rides in  order to minimize L pubic/ hip pain and to travel    Time 6    Period Weeks    Status Achieved      PT LONG TERM GOAL #2   Title Pt will decrease her Pelvic pain on FOTO from 46 pts to < 36 pts,  PFDI Urinary 21pts to < 11 pts, PFDI Prolapse 33 pts to < 23 pts in order to restore QOL and ADLs   ( reassessment 08/04/20 Pain: 4pts  , Urinary   4 pts   ,  Prolapse 4pts    )    Time 10    Period Weeks    Status Achieved      PT LONG TERM GOAL #3   Title Pt will be IND with scoliosis specific HEP in order to minimize worsening of mm/ spinal/ pelvic imbalances and minimize risk for injuries    Time 10    Period Weeks    Status Achieved      PT LONG TERM GOAL #4   Title Pt will demo IND with deep core and pelvic floor coordination in order to progress to strengtening    Time 2    Period Weeks    Status Achieved      PT LONG TERM GOAL #5   Title Pt will report daily bowel movements ( previously occuring every 3-4 days) in order to restore pelvic floor function    Time 8    Period  Weeks    Status Achieved      PT LONG TERM GOAL #6   Title Pt will report no pubic pain with radiating L hip pain across 2 weeks in order to walk and perform ADLs and minimize further narrowing of hips/ minimize risk for Fx 2/2 osteoposis, minimize worsening of scoliosis  ( 1/4: reported no pain for 1 week   1/11: reported no pain  08/31/20: week after scoliosis thorax shift was performed and reported no pain for 4 days  )    Time 6    Period Weeks    Status Achieved      PT LONG TERM GOAL #7   Title Pt will report she is able to get to the bathroom before leakage 100% of the time in order to be out in the community    Time 8    Period Weeks    Status Achieved      PT LONG TERM GOAL #8   Title Pt will demo no pelvic floor mm tightness and proper lengthening and upward movement of pelvic floor with deep core mm in order to minmize worsening of prolapse, burning, and pain and improve QOL    Time 8    Period Weeks    Status Achieved      PT LONG TERM GOAL  #9   TITLE Pt will notice improved prolapse ( noticing bladder protruding from 2 x across 2 months to 1 x across 2 months) in order to improve QOL    Time 8    Period Weeks    Status Not Met                 Plan - 11/16/20 1555    Clinical Impression Statement Pt has met 8/9 goals.  Pt's pubic and R hip pain have resolved as she's leg length, scoliosis , signficant spinal curves/ lateral thorax shift, abdominal scars, pelvic floor mm tightness were addressed and corrected.   Pelvic pain , urinary incontinence, and prolapse made  significant changes. FOTO scores for pelvic pain and prolapse improved  Pelvic pain on FOTO from 46 pts to 4 pts,  PFDI Urinary 21pts to 4 pts, PFDI Prolapse 33 pts to 4 pts on 08/04/20.   While pelvic pain and urinary incontinence remains improved, pt experienced a relapse of prolapse Sx over the few weeks. Pt is considering prolapse surgery and pt was educated on the importance of continuation of pelvic  floor exercise pre and post-op. Pt was assessed again on her technique with pelvic floor contractions and band exercises to ensure pt is not straining/ bearing down on pelvic floor/ organs. Pt was able to self-correct when she strained with abdominal overuse during these exercises.   During the time she started to notice relapse of prolapse, pt also reported her chronic fatigue episode had started and continues to persist today. Pt reported her fatigue episodes commonly occurred after she received a vaccine. BP 96/ 55 mm Hg was taken today in seated position.   Pt was provided active listening about worsened prolapse Sx and her consideration for surgery,  her current chronic fatigue episode. Therapist inquired about nocturia, fatigue and pt reports she gets up 1-3 x night to pee and states her husband notices her snoring. Discussed risk factors for OSA, provided research and advised pt to f/u with PCP on sleep study to rule in or out OSA given nocturia, fatigue Sx, snoring. Pt voiced she will read over the information.   Pt is ready for d/c at this time as pt is IND with her customized scoliosis HEP and pelvic floor HEP. Pt was advised to f/u with her PCP re: chronic fatigue Sx.     Examination-Activity Limitations Toileting;Sit    Stability/Clinical Decision Making Evolving/Moderate complexity    Rehab Potential Good    PT Frequency 1x / week    PT Duration Other (comment)   10   PT Treatment/Interventions Neuromuscular re-education;Stair training;Moist Heat;Therapeutic activities;Patient/family education;Therapeutic exercise;Cryotherapy;Manual techniques;Taping;Scar mobilization    Consulted and Agree with Plan of Care Patient           Patient will benefit from skilled therapeutic intervention in order to improve the following deficits and impairments:  Abnormal gait,Decreased mobility,Decreased coordination,Decreased endurance,Decreased activity tolerance,Decreased balance,Decreased  strength,Increased muscle spasms,Decreased scar mobility,Postural dysfunction  Visit Diagnosis: Other idiopathic scoliosis, thoracolumbar region  Unequal leg length  Muscle weakness (generalized)  Other abnormalities of gait and mobility     Problem List Patient Active Problem List   Diagnosis Date Noted  . Vaginal irritation 04/15/2020  . Pelvic pain 04/15/2020  . Bilateral groin pain 03/17/2020  . Angiomyolipoma of left kidney 03/11/2020  . Left lower quadrant abdominal pain 03/04/2020  . Yeast cells and fungal elements present on diagnostic testing 02/11/2020  . Welcome to Medicare preventive visit 03/03/2017  . Estrogen deficiency 03/03/2017  . Borderline low blood pressure determined by examination 10/31/2016  . Vitamin B12 deficiency 10/31/2016  . Chest pain with moderate risk for cardiac etiology 10/17/2014  . Colon cancer screening 02/17/2014  . Other screening mammogram 02/07/2011  . Routine general medical examination at a health care facility 02/01/2011  . Vitamin D deficiency 04/14/2008  . Osteoporosis 01/09/2007  . Hyperlipidemia with target LDL less than 130 01/04/2007  . Atrophic vaginitis 01/04/2007  . SCOLIOSIS 01/04/2007  . CHRONIC FATIGUE SYNDROME 01/04/2007    Jerl Mina ,PT, DPT, E-RYT  11/17/2020, 8:25 AM  [place] MAIN [facility] SERVICES [address] [place], [place], [address] Phone:  [phone]   Fax:  [phone]  Name: Darra Rosa MRN: [id number] Date of Birth: May 27, 1951

## 2020-11-23 ENCOUNTER — Ambulatory Visit: Payer: Medicare HMO | Admitting: Physical Therapy

## 2020-11-29 ENCOUNTER — Encounter (HOSPITAL_BASED_OUTPATIENT_CLINIC_OR_DEPARTMENT_OTHER): Payer: Self-pay | Admitting: *Deleted

## 2020-11-30 ENCOUNTER — Encounter: Payer: Medicare HMO | Admitting: Physical Therapy

## 2020-11-30 ENCOUNTER — Encounter: Payer: Medicare HMO | Admitting: Obstetrics and Gynecology

## 2020-11-30 ENCOUNTER — Encounter: Payer: Self-pay | Admitting: Obstetrics and Gynecology

## 2021-02-17 NOTE — Progress Notes (Signed)
[facility] [facility] Urodynamics Procedure  Referring Physician: Tower, Wynelle Fanny, MD Date of Procedure: 02/19/2021  [name] is a 70 y.o. female who presents for urodynamic evaluation. Indication(s) for study: occult SUI  Vital Signs: BP 114/69   Pulse 73   Laboratory Results: A catheterized urine specimen revealed:  POC urine: negative  Voiding Diary: Not performed  Procedure Timeout:  The correct patient was verified and the correct procedure was verified. The patient was in the correct position and safety precautions were reviewed based on at the patient's history.  Urodynamic Procedure A 24F dual lumen urodynamics catheter was placed under sterile conditions into the patient's bladder. A 24F catheter was placed into the rectum in order to measure abdominal pressure. EMG patches were placed in the appropriate position.  All connections were confirmed and calibrations/adjusted made. Saline was instilled into the bladder through the dual lumen catheters.  Cough/valsalva pressures were measured periodically during filling.  Patient was allowed to void.  The bladder was then emptied of its residual.  UROFLOW: Revealed a Qmax of 4.5 mL/sec.  She voided 103 mL and had a residual of 70 mL.  It was a interrupted pattern and represented normal habits though interpretation limited due to low voided volume.  CMG: This was performed with sterile water in the sitting position at a fill rate of 30 mL/min.    First sensation of fullness was 103 mLs,  First urge was 135 mLs,  Strong urge was 187 mLs and  Capacity was 455 mLs  Stress incontinence was not demonstrated Highest negative Barrier CLPP was 69 cmH20 at 456 ml. Highest negative Barrier VLPP was 60 cmH20 at 200 ml.  Detrusor function was normal, with no phasic contractions seen.    Compliance:  normal. End fill detrusor pressure was 3cmH20.    UPP: MUCP with/ without barrier reduction was 60 cm of water.     MICTURITION STUDY: Voiding was performed with reduction using scopettes in the sitting position.  Pdet at Qmax was 1.5 cm of water.  Qmax was 8.5 mL/sec.  It was a interrupted pattern.  She had a residual of 50 mL.  It was a volitional void, sustained detrusor contraction was present and abdominal straining was present  EMG: This was performed with patches.  She had voluntary contractions, recruitment with fill was present and urethral sphincter was not relaxed with void.  The details of the procedure with the study tracings have been scanned into EPIC.   Urodynamic Impression:  1. Sensation was normal; capacity was normal 2. Stress Incontinence was not demonstrated 3. Detrusor Overactivity was not demonstrated  4. Emptying was normal with a normal PVR, a sustained detrusor contraction present,  abdominal straining present, dyssynergic urethral sphincter activity on EMG.  Plan: - The patient will follow up  to discuss the findings and treatment options.

## 2021-02-19 ENCOUNTER — Encounter: Payer: Self-pay | Admitting: Obstetrics and Gynecology

## 2021-02-19 ENCOUNTER — Ambulatory Visit (INDEPENDENT_AMBULATORY_CARE_PROVIDER_SITE_OTHER): Payer: Medicare HMO | Admitting: Obstetrics and Gynecology

## 2021-02-19 ENCOUNTER — Other Ambulatory Visit: Payer: Self-pay

## 2021-02-19 VITALS — BP 114/69 | HR 73

## 2021-02-19 DIAGNOSIS — R350 Frequency of micturition: Secondary | ICD-10-CM

## 2021-02-19 LAB — POCT URINALYSIS DIPSTICK
Appearance: NORMAL
Bilirubin, UA: NEGATIVE
Blood, UA: NEGATIVE
Glucose, UA: NEGATIVE
Ketones, UA: NEGATIVE
Leukocytes, UA: NEGATIVE
Nitrite, UA: NEGATIVE
Protein, UA: NEGATIVE
Spec Grav, UA: 1.01 (ref 1.010–1.025)
Urobilinogen, UA: 0.2 E.U./dL
pH, UA: 5 (ref 5.0–8.0)

## 2021-02-19 NOTE — Progress Notes (Signed)
[facility] Urogynecology Pre-Operative visit  Subjective Chief Complaint: Tracy [name] presents for a preoperative encounter.   History of Present Illness: Tracy [name] is a 70 y.o. female who presents for preoperative visit.  She is scheduled to undergo anterior repair, cystoscopy on 03/15/21.  Her symptoms include vaginal bulge, and she was was found to have Stage II anterior, Stage I posterior, Stage I apical prolapse .   Urodynamics showed: 1. Sensation was normal; capacity was normal 2. Stress Incontinence was not demonstrated 3. Detrusor Overactivity was not demonstrated 4. Emptying was normal with a normal PVR, a sustained detrusor contraction present,  abdominal straining present, dyssynergic urethral sphincter activity on EMG.  Past Medical History:  Diagnosis Date   Chronic fatigue syndrome    Hyperlipidemia    Osteoporosis      Past Surgical History:  Procedure Laterality Date   ABDOMINAL HYSTERECTOMY     one ovary removed/ bleeding   CERVICAL POLYPECTOMY     DILATION AND CURETTAGE OF UTERUS     UNILATERAL SALPINGECTOMY Right     is allergic to alendronate sodium, amoxicillin, ibuprofen, and ivp dye [iodinated diagnostic agents].   Family History  Problem Relation Age of Onset   Stroke Mother    Hypertension Father    Stroke Father    Hyperlipidemia Sister    Hyperlipidemia Brother    Cancer Paternal Uncle    Cancer Maternal Grandmother        intestinal CA   Cancer Paternal Grandfather        CA unsure which kind   Hyperlipidemia Brother    Heart attack Sister        Thought to be silent MI   Breast cancer Neg Hx     Social History   Tobacco Use   Smoking status: Never   Smokeless tobacco: Never  Vaping Use   Vaping Use: Never used  Substance Use Topics   Alcohol use: No    Alcohol/week: 0.0 standard drinks   Drug use: No     Review of Systems was negative for a full 10 system review except as noted in the History of Present  Illness.   Current Outpatient Medications:    Calcium-Vitamin D-Vitamin K 650-12.5-40 MG-MCG-MCG CHEW, Chew 2 tablets by mouth daily., Disp: , Rfl:    Cholecalciferol (VITAMIN D) 2000 UNITS CAPS, Take 1 capsule by mouth daily., Disp: , Rfl:    cyanocobalamin 1000 MCG tablet, Take 500 mcg by mouth daily. , Disp: , Rfl:    diazepam (VALIUM) 2 MG tablet, Take 1 tablet (2 mg total) by mouth every 12 (twelve) hours as needed for muscle spasms., Disp: 30 tablet, Rfl: 0   raloxifene (EVISTA) 60 MG tablet, Take 1 tablet (60 mg total) by mouth daily., Disp: 90 tablet, Rfl: 3   simvastatin (ZOCOR) 40 MG tablet, TAKE (1) TABLET BY MOUTH DAILY AT BEDTIME, Disp: 90 tablet, Rfl: 3   triamcinolone cream (KENALOG) 0.5 %, Apply 1 application topically every other day., Disp: 30 g, Rfl: 3   Objective Vitals:   02/19/21 1507  BP: 114/69  Pulse: 73     Previous Pelvic Exam showed:  POP-Q   0                                            Aa   0  Ba   -7                                              C    2.5                                            Gh   3                                            Pb   8                                            tvl    -2                                            Ap   -2                                            Bp                                                  D       Assessment/ Plan  Assessment: The patient is a 70 y.o. year old scheduled to undergo anterior repair and cystoscopy. Verbal consent was obtained for these procedures.  Plan: General Surgical Consent: The patient has previously been counseled on alternative treatments, and the decision by the patient and provider was to proceed with the procedure listed above.  For all procedures, there are risks of bleeding, infection, damage to surrounding organs including but not limited to bowel, bladder, blood vessels, ureters and nerves, and need for  further surgery if an injury were to occur. These risks are all low with minimally invasive surgery.   There are risks of numbness and weakness at any body site or buttock/rectal pain.  It is possible that baseline pain can be worsened by surgery, either with or without mesh. If surgery is vaginal, there is also a low risk of possible conversion to laparoscopy or open abdominal incision where indicated. Very rare risks include blood transfusion, blood clot, heart attack, pneumonia, or death.   There is also a risk of short-term postoperative urinary retention with need to use a catheter. About half of patients need to go home from surgery with a catheter, which is then later removed in the office. The risk of long-term need for a catheter is very low. There is also a risk of worsening of overactive bladder.    Prolapse (with or without mesh): Risk factors for surgical failure  include things that put pressure on your pelvis and the surgical repair, including  obesity, chronic cough, and heavy lifting or straining (including lifting children or adults, straining on the toilet, or lifting heavy objects such as furniture or anything weighing >25 lbs. Risks of recurrence is 20-30% with vaginal native tissue repair and a less than 10% with sacrocolpopexy with mesh.    We discussed consent for blood products. Risks for blood transfusion include allergic reactions, other reactions that can affect different body organs and managed accordingly, transmission of infectious diseases such as HIV or Hepatitis. However, the blood is screened. Patient consents for blood products.  Pre-operative instructions:  She was instructed to not take Aspirin/NSAIDs x 7days prior to surgery. Antibiotic prophylaxis was ordered as indicated.  Cathter use: Patient will go home with foley if needed after post-operative voiding trial.  Post-operative instructions:  She was provided with specific post-operative instructions, including  precautions and signs/symptoms for which we would recommend contacting us, in addition to daytime and after-hours contact phone numbers. This was provided on a handout.   Post-operative medications: Prescriptions for motrin, tylenol, miralax, and oxycodone will be sent to her pharmacy. Discussed using ibuprofen and tylenol on a schedule to limit use of narcotics.   Laboratory testing:  none needed  Preoperative clearance:  She does not require surgical clearance.    Post-operative follow-up:  A post-operative appointment will be made for 6 weeks from the date of surgery. If she needs a post-operative nurse visit for a voiding trial, that will be set up after she leaves the hospital.    Patient will call the clinic or use MyChart should anything change or any new issues arise.   [name], MD

## 2021-03-09 ENCOUNTER — Other Ambulatory Visit: Payer: Self-pay | Admitting: Obstetrics and Gynecology

## 2021-03-09 DIAGNOSIS — Z01818 Encounter for other preprocedural examination: Secondary | ICD-10-CM

## 2021-03-09 MED ORDER — OXYCODONE HCL 5 MG PO TABS: 5.0000 mg | ORAL_TABLET | ORAL | 0 refills | Status: DC | PRN

## 2021-03-09 MED ORDER — ACETAMINOPHEN 500 MG PO TABS: 500.0000 mg | ORAL_TABLET | Freq: Four times a day (QID) | ORAL | 0 refills | Status: DC | PRN

## 2021-03-09 MED ORDER — POLYETHYLENE GLYCOL 3350 17 GM/SCOOP PO POWD: 17.0000 g | Freq: Every day | ORAL | 0 refills | Status: DC

## 2021-03-09 NOTE — H&P (Signed)
Tracy [name].   History of Present Illness: Tracy [name] is a 70 y.o. female who presents for preoperative visit.  She is scheduled to undergo anterior repair, cystoscopy on 03/15/21.  Her symptoms include vaginal bulge, and she was was found to have Stage II anterior, Stage I posterior, Stage I apical prolapse .   Urodynamics showed: 1. Sensation was normal; capacity was normal 2. Stress Incontinence was not demonstrated 3. Detrusor Overactivity was not demonstrated 4. Emptying was normal with a normal PVR, a sustained detrusor contraction present,  abdominal straining present, dyssynergic urethral sphincter activity on EMG.  Past Medical History:  Diagnosis Date   Chronic fatigue syndrome    Hyperlipidemia    Osteoporosis      Past Surgical History:  Procedure Laterality Date   ABDOMINAL HYSTERECTOMY     one ovary removed/ bleeding   CERVICAL POLYPECTOMY     DILATION AND CURETTAGE OF UTERUS     UNILATERAL SALPINGECTOMY Right     is allergic to alendronate sodium, amoxicillin, ibuprofen, and ivp dye [iodinated diagnostic agents].   Family History  Problem Relation Age of Onset   Stroke Mother    Hypertension Father    Stroke Father    Hyperlipidemia Sister    Hyperlipidemia Brother    Cancer Paternal Uncle    Cancer Maternal Grandmother        intestinal CA   Cancer Paternal Grandfather        CA unsure which kind   Hyperlipidemia Brother    Heart attack Sister        Thought to be silent MI   Breast cancer Neg Hx     Social History   Tobacco Use   Smoking status: Never   Smokeless tobacco: Never  Vaping Use   Vaping Use: Never used  Substance Use Topics   Alcohol use: No    Alcohol/week: 0.0 standard drinks   Drug use: No     Review of Systems was negative for a full 10 system review except as noted in the History of Present  Illness.  No current facility-administered medications for this [name].  Current Outpatient Medications:    Calcium-Vitamin D-Vitamin K 650-12.5-40 MG-MCG-MCG CHEW, Chew 2 tablets by mouth daily., Disp: , Rfl:    Cholecalciferol (VITAMIN D) 2000 UNITS CAPS, Take 1 capsule by mouth daily., Disp: , Rfl:    cyanocobalamin 1000 MCG tablet, Take 500 mcg by mouth daily. , Disp: , Rfl:    diazepam (VALIUM) 2 MG tablet, Take 1 tablet (2 mg total) by mouth every 12 (twelve) hours as needed for muscle spasms., Disp: 30 tablet, Rfl: 0   raloxifene (EVISTA) 60 MG tablet, Take 1 tablet (60 mg total) by mouth daily., Disp: 90 tablet, Rfl: 3   simvastatin (ZOCOR) 40 MG tablet, TAKE (1) TABLET BY MOUTH DAILY AT BEDTIME, Disp: 90 tablet, Rfl: 3   triamcinolone cream (KENALOG) 0.5 %, Apply 1 application topically every other day., Disp: 30 g, Rfl: 3   Objective There were no vitals filed for this visit.    Previous Pelvic Exam showed:  POP-Q   0                                            Aa   0  Ba   -7                                              C    2.5                                            Gh   3                                            Pb   8                                            tvl    -2                                            Ap   -2                                            Bp                                                  D       Assessment/ Plan  Assessment: The patient is a 70 y.o. year old with Stage II pelvic organ prolapse.    Plan: anterior repair and cystoscopy.   [name], MD

## 2021-03-09 NOTE — Progress Notes (Signed)
Notified pt that oxycodone, tylenol, and miralax had been sent to the pharmacy. Advised pt to pick these up prior to her procedure to have on hand to take after her procedure. Pt verbalized understanding.

## 2021-03-11 ENCOUNTER — Other Ambulatory Visit: Payer: Self-pay

## 2021-03-11 ENCOUNTER — Encounter (HOSPITAL_BASED_OUTPATIENT_CLINIC_OR_DEPARTMENT_OTHER): Payer: Self-pay | Admitting: Obstetrics and Gynecology

## 2021-03-11 NOTE — Progress Notes (Signed)
Spoke w/ via phone for pre-op interview--- Pt Lab needs dos----  CBC, T&S             Lab results------ no COVID test -----patient states asymptomatic no test needed Arrive at ------- 0530 on 03-15-2021 NPO after MN NO Solid Food.  Clear liquids from MN until--- 0430 Med rec completed Medications to take morning of surgery ----- None Diabetic medication ----- n/a Patient instructed no nail polish to be worn day of surgery Patient instructed to bring photo id and insurance card day of surgery Patient aware to have Driver (ride ) / caregiver for 24 hours after surgery --husband, Tracy [name] Patient Special Instructions ----- n/a Pre-Op special Istructions ----- n/a Patient verbalized understanding of instructions that were given at this phone interview. Patient denies shortness of breath, chest pain, fever, cough at this phone interview.

## 2021-03-15 ENCOUNTER — Ambulatory Visit (HOSPITAL_BASED_OUTPATIENT_CLINIC_OR_DEPARTMENT_OTHER)
Admission: RE | Admit: 2021-03-15 | Discharge: 2021-03-15 | Disposition: A | Discharge status: Home / Self Care | Payer: Medicare HMO | Attending: Obstetrics and Gynecology | Admitting: Obstetrics and Gynecology

## 2021-03-15 ENCOUNTER — Ambulatory Visit (HOSPITAL_BASED_OUTPATIENT_CLINIC_OR_DEPARTMENT_OTHER): Payer: Medicare HMO | Admitting: Anesthesiology

## 2021-03-15 ENCOUNTER — Telehealth: Payer: Self-pay | Admitting: Obstetrics and Gynecology

## 2021-03-15 ENCOUNTER — Other Ambulatory Visit: Payer: Self-pay

## 2021-03-15 ENCOUNTER — Encounter (HOSPITAL_BASED_OUTPATIENT_CLINIC_OR_DEPARTMENT_OTHER): Payer: Self-pay | Admitting: Obstetrics and Gynecology

## 2021-03-15 ENCOUNTER — Encounter (HOSPITAL_BASED_OUTPATIENT_CLINIC_OR_DEPARTMENT_OTHER)
Admission: RE | Disposition: A | Discharge status: Home / Self Care | Payer: Self-pay | Source: Home / Self Care | Attending: Obstetrics and Gynecology

## 2021-03-15 ENCOUNTER — Encounter: Payer: Self-pay | Admitting: Obstetrics and Gynecology

## 2021-03-15 DIAGNOSIS — Z8249 Family history of ischemic heart disease and other diseases of the circulatory system: Secondary | ICD-10-CM | POA: Insufficient documentation

## 2021-03-15 DIAGNOSIS — Z91041 Radiographic dye allergy status: Secondary | ICD-10-CM | POA: Diagnosis not present

## 2021-03-15 DIAGNOSIS — Z881 Allergy status to other antibiotic agents status: Secondary | ICD-10-CM | POA: Insufficient documentation

## 2021-03-15 DIAGNOSIS — N814 Uterovaginal prolapse, unspecified: Secondary | ICD-10-CM | POA: Diagnosis not present

## 2021-03-15 DIAGNOSIS — N393 Stress incontinence (female) (male): Secondary | ICD-10-CM | POA: Diagnosis not present

## 2021-03-15 DIAGNOSIS — Z823 Family history of stroke: Secondary | ICD-10-CM | POA: Insufficient documentation

## 2021-03-15 DIAGNOSIS — E785 Hyperlipidemia, unspecified: Secondary | ICD-10-CM | POA: Diagnosis not present

## 2021-03-15 DIAGNOSIS — Z8349 Family history of other endocrine, nutritional and metabolic diseases: Secondary | ICD-10-CM | POA: Diagnosis not present

## 2021-03-15 DIAGNOSIS — Z800 Family history of malignant neoplasm of digestive organs: Secondary | ICD-10-CM | POA: Diagnosis not present

## 2021-03-15 DIAGNOSIS — N8111 Cystocele, midline: Secondary | ICD-10-CM | POA: Diagnosis not present

## 2021-03-15 DIAGNOSIS — N813 Complete uterovaginal prolapse: Secondary | ICD-10-CM | POA: Diagnosis not present

## 2021-03-15 DIAGNOSIS — Z888 Allergy status to other drugs, medicaments and biological substances status: Secondary | ICD-10-CM | POA: Insufficient documentation

## 2021-03-15 DIAGNOSIS — M810 Age-related osteoporosis without current pathological fracture: Secondary | ICD-10-CM | POA: Diagnosis not present

## 2021-03-15 HISTORY — DX: Frequency of micturition: R35.0

## 2021-03-15 HISTORY — PX: CYSTOSCOPY: SHX5120

## 2021-03-15 HISTORY — DX: Personal history of other diseases of the digestive system: Z87.19

## 2021-03-15 HISTORY — DX: Diaphragmatic hernia without obstruction or gangrene: K44.9

## 2021-03-15 HISTORY — PX: CYSTOCELE REPAIR: SHX163

## 2021-03-15 HISTORY — DX: Unspecified hemorrhoids: K64.9

## 2021-03-15 HISTORY — DX: Cystocele, unspecified: N81.10

## 2021-03-15 HISTORY — DX: Presence of spectacles and contact lenses: Z97.3

## 2021-03-15 LAB — CBC
HCT: 43.1 % (ref 36.0–46.0)
Hemoglobin: 13.8 g/dL (ref 12.0–15.0)
MCH: 31.4 pg (ref 26.0–34.0)
MCHC: 32 g/dL (ref 30.0–36.0)
MCV: 98 fL (ref 80.0–100.0)
Platelets: 175 10*3/uL (ref 150–400)
RBC: 4.4 MIL/uL (ref 3.87–5.11)
RDW: 13.6 % (ref 11.5–15.5)
WBC: 6.1 10*3/uL (ref 4.0–10.5)
nRBC: 0 % (ref 0.0–0.2)

## 2021-03-15 LAB — POCT I-STAT, CHEM 8
BUN: 8 mg/dL (ref 8–23)
Calcium, Ion: 1.01 mmol/L — ABNORMAL LOW (ref 1.15–1.40)
Chloride: 109 mmol/L (ref 98–111)
Creatinine, Ser: 0.6 mg/dL (ref 0.44–1.00)
Glucose, Bld: 84 mg/dL (ref 70–99)
HCT: 42 % (ref 36.0–46.0)
Hemoglobin: 14.3 g/dL (ref 12.0–15.0)
Potassium: 3.9 mmol/L (ref 3.5–5.1)
Sodium: 141 mmol/L (ref 135–145)
TCO2: 24 mmol/L (ref 22–32)

## 2021-03-15 LAB — TYPE AND SCREEN
ABO/RH(D): A POS
Antibody Screen: NEGATIVE

## 2021-03-15 LAB — ABO/RH: ABO/RH(D): A POS

## 2021-03-15 SURGERY — COLPORRHAPHY, ANTERIOR, FOR CYSTOCELE REPAIR
Anesthesia: General | Site: Vagina

## 2021-03-15 MED ORDER — OXYCODONE HCL 5 MG PO TABS: 5.0000 mg | ORAL_TABLET | Freq: Once | ORAL | Status: DC | PRN

## 2021-03-15 MED ORDER — 0.9 % SODIUM CHLORIDE (POUR BTL) OPTIME
TOPICAL | Status: DC | PRN
  Administered 2021-03-15: 500 mL

## 2021-03-15 MED ORDER — OXYCODONE HCL 5 MG/5ML PO SOLN: 5.0000 mg | Freq: Once | ORAL | Status: DC | PRN

## 2021-03-15 MED ORDER — ACETAMINOPHEN 10 MG/ML IV SOLN: 1000.0000 mg | Freq: Once | INTRAVENOUS | Status: DC | PRN

## 2021-03-15 MED ORDER — FENTANYL CITRATE (PF) 100 MCG/2ML IJ SOLN: 25.0000 ug | INTRAMUSCULAR | Status: DC | PRN

## 2021-03-15 MED ORDER — FENTANYL CITRATE (PF) 100 MCG/2ML IJ SOLN
INTRAMUSCULAR | Status: AC
  Filled 2021-03-15: qty 2

## 2021-03-15 MED ORDER — ONDANSETRON HCL 4 MG/2ML IJ SOLN
INTRAMUSCULAR | Status: AC
  Filled 2021-03-15: qty 2

## 2021-03-15 MED ORDER — DEXAMETHASONE SODIUM PHOSPHATE 10 MG/ML IJ SOLN
INTRAMUSCULAR | Status: DC | PRN
  Administered 2021-03-15: 5 mg via INTRAVENOUS

## 2021-03-15 MED ORDER — ACETAMINOPHEN 500 MG PO TABS
1000.0000 mg | ORAL_TABLET | ORAL | Status: AC
  Administered 2021-03-15: 1000 mg via ORAL

## 2021-03-15 MED ORDER — EPHEDRINE 5 MG/ML INJ
INTRAVENOUS | Status: AC
  Filled 2021-03-15: qty 5

## 2021-03-15 MED ORDER — PHENYLEPHRINE 40 MCG/ML (10ML) SYRINGE FOR IV PUSH (FOR BLOOD PRESSURE SUPPORT)
PREFILLED_SYRINGE | INTRAVENOUS | Status: AC
  Filled 2021-03-15: qty 10

## 2021-03-15 MED ORDER — DEXAMETHASONE SODIUM PHOSPHATE 10 MG/ML IJ SOLN
INTRAMUSCULAR | Status: AC
  Filled 2021-03-15: qty 1

## 2021-03-15 MED ORDER — ONDANSETRON HCL 4 MG/2ML IJ SOLN
INTRAMUSCULAR | Status: DC | PRN
  Administered 2021-03-15: 4 mg via INTRAVENOUS

## 2021-03-15 MED ORDER — PROPOFOL 500 MG/50ML IV EMUL
INTRAVENOUS | Status: AC
  Filled 2021-03-15: qty 50

## 2021-03-15 MED ORDER — EPHEDRINE SULFATE-NACL 50-0.9 MG/10ML-% IV SOSY
PREFILLED_SYRINGE | INTRAVENOUS | Status: DC | PRN
  Administered 2021-03-15 (×5): 5 mg via INTRAVENOUS

## 2021-03-15 MED ORDER — GENTAMICIN SULFATE 40 MG/ML IJ SOLN
240.0000 mg | INTRAVENOUS | Status: AC
  Administered 2021-03-15: 240 mg via INTRAVENOUS
  Filled 2021-03-15: qty 6

## 2021-03-15 MED ORDER — ACETAMINOPHEN 160 MG/5ML PO SOLN: 1000.0000 mg | Freq: Once | ORAL | Status: DC | PRN

## 2021-03-15 MED ORDER — POVIDONE-IODINE 10 % EX SWAB: 2.0000 "application " | Freq: Once | CUTANEOUS | Status: DC

## 2021-03-15 MED ORDER — MIDAZOLAM HCL 5 MG/5ML IJ SOLN
INTRAMUSCULAR | Status: DC | PRN
  Administered 2021-03-15: 2 mg via INTRAVENOUS

## 2021-03-15 MED ORDER — CLINDAMYCIN PHOSPHATE 900 MG/50ML IV SOLN
900.0000 mg | INTRAVENOUS | Status: AC
  Administered 2021-03-15: 900 mg via INTRAVENOUS

## 2021-03-15 MED ORDER — CLINDAMYCIN PHOSPHATE 900 MG/50ML IV SOLN
INTRAVENOUS | Status: AC
  Filled 2021-03-15: qty 50

## 2021-03-15 MED ORDER — FENTANYL CITRATE (PF) 100 MCG/2ML IJ SOLN
INTRAMUSCULAR | Status: DC | PRN
  Administered 2021-03-15: 25 ug via INTRAVENOUS
  Administered 2021-03-15: 50 ug via INTRAVENOUS
  Administered 2021-03-15: 25 ug via INTRAVENOUS

## 2021-03-15 MED ORDER — PHENAZOPYRIDINE HCL 100 MG PO TABS
200.0000 mg | ORAL_TABLET | ORAL | Status: AC
  Administered 2021-03-15: 200 mg via ORAL

## 2021-03-15 MED ORDER — PHENAZOPYRIDINE HCL 100 MG PO TABS
ORAL_TABLET | ORAL | Status: AC
  Filled 2021-03-15: qty 2

## 2021-03-15 MED ORDER — MIDAZOLAM HCL 2 MG/2ML IJ SOLN
INTRAMUSCULAR | Status: AC
  Filled 2021-03-15: qty 2

## 2021-03-15 MED ORDER — PHENYLEPHRINE 40 MCG/ML (10ML) SYRINGE FOR IV PUSH (FOR BLOOD PRESSURE SUPPORT)
PREFILLED_SYRINGE | INTRAVENOUS | Status: DC | PRN
  Administered 2021-03-15: 80 ug via INTRAVENOUS
  Administered 2021-03-15 (×2): 40 ug via INTRAVENOUS
  Administered 2021-03-15 (×2): 80 ug via INTRAVENOUS
  Administered 2021-03-15: 40 ug via INTRAVENOUS

## 2021-03-15 MED ORDER — PROPOFOL 10 MG/ML IV BOLUS
INTRAVENOUS | Status: DC | PRN
  Administered 2021-03-15: 150 mg via INTRAVENOUS

## 2021-03-15 MED ORDER — ACETAMINOPHEN 500 MG PO TABS
ORAL_TABLET | ORAL | Status: AC
  Filled 2021-03-15: qty 2

## 2021-03-15 MED ORDER — SODIUM CHLORIDE 0.9 % IR SOLN
Status: DC | PRN
  Administered 2021-03-15: 300 mL via INTRAVESICAL

## 2021-03-15 MED ORDER — ACETAMINOPHEN 500 MG PO TABS: 1000.0000 mg | ORAL_TABLET | Freq: Once | ORAL | Status: DC | PRN

## 2021-03-15 MED ORDER — LACTATED RINGERS IV SOLN: INTRAVENOUS | Status: DC

## 2021-03-15 MED ORDER — LIDOCAINE HCL (PF) 2 % IJ SOLN
INTRAMUSCULAR | Status: AC
  Filled 2021-03-15: qty 5

## 2021-03-15 MED ORDER — LIDOCAINE-EPINEPHRINE 1 %-1:100000 IJ SOLN
INTRAMUSCULAR | Status: DC | PRN
  Administered 2021-03-15: 10 mL

## 2021-03-15 SURGICAL SUPPLY — 51 items
ADH SKN CLS APL DERMABOND .7 (GAUZE/BANDAGES/DRESSINGS) ×3
AGENT HMST KT MTR STRL THRMB (HEMOSTASIS)
APL ESCP 34 STRL LF DISP (HEMOSTASIS)
APPLICATOR SURGIFLO ENDO (HEMOSTASIS) IMPLANT
BLADE SURG 15 STRL LF DISP TIS (BLADE) ×3 IMPLANT
BLADE SURG 15 STRL SS (BLADE) ×4
BNDG GAUZE ELAST 4 BULKY (GAUZE/BANDAGES/DRESSINGS) IMPLANT
DECANTER SPIKE VIAL GLASS SM (MISCELLANEOUS) IMPLANT
DERMABOND ADVANCED (GAUZE/BANDAGES/DRESSINGS) ×1
DERMABOND ADVANCED .7 DNX12 (GAUZE/BANDAGES/DRESSINGS) ×3 IMPLANT
DEVICE CAPIO SLIM SINGLE (INSTRUMENTS) IMPLANT
DRAPE STERI URO 9X17 APER PCH (DRAPES) IMPLANT
ELECT REM PT RETURN 9FT ADLT (ELECTROSURGICAL) ×4
ELECTRODE REM PT RTRN 9FT ADLT (ELECTROSURGICAL) ×3 IMPLANT
GAUZE 4X4 16PLY ~~LOC~~+RFID DBL (SPONGE) ×4 IMPLANT
GAUZE PACKING 2X5 YD STRL (GAUZE/BANDAGES/DRESSINGS) IMPLANT
GLOVE SURG ENC MOIS LTX SZ6 (GLOVE) ×4 IMPLANT
GLOVE SURG LTX SZ6 (GLOVE) ×4 IMPLANT
GLOVE SURG UNDER POLY LF SZ6.5 (GLOVE) ×4 IMPLANT
GLOVE SURG UNDER POLY LF SZ7 (GLOVE) ×4 IMPLANT
GOWN STRL REUS W/TWL LRG LVL3 (GOWN DISPOSABLE) ×4 IMPLANT
HIBICLENS CHG 4% 4OZ (MISCELLANEOUS) ×4 IMPLANT
IV NS 1000ML (IV SOLUTION) ×4
IV NS 1000ML BAXH (IV SOLUTION) ×3 IMPLANT
KIT TURNOVER CYSTO (KITS) ×4 IMPLANT
MANIFOLD NEPTUNE II (INSTRUMENTS) ×4 IMPLANT
NEEDLE HYPO 22GX1.5 SAFETY (NEEDLE) ×4 IMPLANT
NEEDLE MAYO 6 CRC TAPER PT (NEEDLE) IMPLANT
NS IRRIG 1000ML POUR BTL (IV SOLUTION) IMPLANT
NS IRRIG 500ML POUR BTL (IV SOLUTION) ×4 IMPLANT
PACK CYSTO (CUSTOM PROCEDURE TRAY) IMPLANT
PACK VAGINAL WOMENS (CUSTOM PROCEDURE TRAY) ×4 IMPLANT
PAD OB MATERNITY 4.3X12.25 (PERSONAL CARE ITEMS) ×4 IMPLANT
RETRACTOR LONE STAR DISPOSABLE (INSTRUMENTS) ×4 IMPLANT
RETRACTOR STAY HOOK 5MM (MISCELLANEOUS) ×4 IMPLANT
SET IRRIG Y TYPE TUR BLADDER L (SET/KITS/TRAYS/PACK) ×4 IMPLANT
SURGIFLO W/THROMBIN 8M KIT (HEMOSTASIS) IMPLANT
SUT ABS MONO DBL WITH NDL 48IN (SUTURE) IMPLANT
SUT CAPIO PGA 48IN SZ 0 (SUTURE) IMPLANT
SUT MON AB 2-0 SH 27 (SUTURE) IMPLANT
SUT PDS PLUS 0 (SUTURE)
SUT PDS PLUS AB 0 CT-2 (SUTURE) IMPLANT
SUT VIC AB 0 CT1 27 (SUTURE)
SUT VIC AB 0 CT1 27XBRD ANTBC (SUTURE) IMPLANT
SUT VIC AB 0 CT1 27XCR 8 STRN (SUTURE) IMPLANT
SUT VIC AB 2-0 SH 27 (SUTURE)
SUT VIC AB 2-0 SH 27XBRD (SUTURE) IMPLANT
SUT VICRYL 2-0 SH 8X27 (SUTURE) ×4 IMPLANT
SYR BULB EAR ULCER 3OZ GRN STR (SYRINGE) ×4 IMPLANT
TOWEL OR 17X26 10 PK STRL BLUE (TOWEL DISPOSABLE) ×4 IMPLANT
TRAY FOLEY W/BAG SLVR 14FR LF (SET/KITS/TRAYS/PACK) ×4 IMPLANT

## 2021-03-15 NOTE — Transfer of Care (Signed)
Immediate Anesthesia Transfer of Care Note  Patient: Tracy [name]  Procedure(s) Performed: ANTERIOR REPAIR (CYSTOCELE) CYSTOSCOPY  Patient Location: PACU  Anesthesia Type:General  Level of Consciousness: drowsy and patient cooperative  Airway & Oxygen Therapy: Patient Spontanous Breathing and Patient connected to face mask oxygen  Post-op Assessment: Report given to RN and Post -op Vital signs reviewed and stable  Post vital signs: Reviewed and stable  Last Vitals:  Vitals Value Taken Time  BP 96/38 03/15/21 0819  Temp 36.4 C 03/15/21 0818  Pulse 77 03/15/21 0822  Resp 9 03/15/21 0822  SpO2 98 % 03/15/21 0822  Vitals shown include unvalidated device data.  Last Pain:  Vitals:   03/15/21 0548  TempSrc: Oral      Patients Stated Pain Goal: 4 ([id number])  Complications: No notable events documented.

## 2021-03-15 NOTE — Telephone Encounter (Signed)
Tracy [name] underwent an anterior repair and cystoscopy on 03/15/21.   She passed her voiding trial.  243m was backfilled into the bladder Voided 1741m PVR by bladder scan was 27m16m  She was discharged without a catheter. Please call her for a routine post op check. Thanks!  MicJaquita FoldsD

## 2021-03-15 NOTE — Discharge Instructions (Addendum)

## 2021-03-15 NOTE — Anesthesia Preprocedure Evaluation (Addendum)
Anesthesia Evaluation  Patient identified by MRN, date of birth, ID band Patient awake    Reviewed: Allergy & Precautions, NPO status , Patient's Chart, lab work & pertinent test results  History of Anesthesia Complications Negative for: history of anesthetic complications  Airway Mallampati: II  TM Distance: >3 FB Neck ROM: Full    Dental  (+) Dental Advisory Given, Teeth Intact   Pulmonary neg shortness of breath, neg sleep apnea, neg COPD, neg recent URI,  Covid-19 Nucleic Acid Test Results No results found for: SARSCOV2NAA, SARSCOV2    breath sounds clear to auscultation       Cardiovascular negative cardio ROS   Rhythm:Regular     Neuro/Psych negative neurological ROS  negative psych ROS   GI/Hepatic hiatal hernia,   Endo/Other  negative endocrine ROS  Renal/GU Renal diseaseLab Results      Component                Value               Date                      CREATININE               0.66                03/24/2020                Musculoskeletal negative musculoskeletal ROS (+)   Abdominal   Peds  Hematology negative hematology ROS (+)   Anesthesia Other Findings   Reproductive/Obstetrics                            Anesthesia Physical Anesthesia Plan  ASA: 1  Anesthesia Plan: General   Post-op Pain Management:    Induction: Intravenous  PONV Risk Score and Plan: 3 and Ondansetron and Dexamethasone  Airway Management Planned: LMA and Oral ETT  Additional Equipment: None  Intra-op Plan:   Post-operative Plan: Extubation in OR  Informed Consent: I have reviewed the patients History and Physical, chart, labs and discussed the procedure including the risks, benefits and alternatives for the proposed anesthesia with the patient or authorized representative who has indicated his/her understanding and acceptance.     Dental advisory given  Plan Discussed with: CRNA and  Anesthesiologist  Anesthesia Plan Comments:         Anesthesia Quick Evaluation

## 2021-03-15 NOTE — Anesthesia Procedure Notes (Signed)
Procedure Name: LMA Insertion Date/Time: 03/15/2021 7:34 AM Performed by: Genelle Bal, CRNA Pre-anesthesia Checklist: Patient identified, Emergency Drugs available, Suction available and Patient being monitored Patient Re-evaluated:Patient Re-evaluated prior to induction Oxygen Delivery Method: Circle system utilized Preoxygenation: Pre-oxygenation with 100% oxygen Induction Type: IV induction Ventilation: Mask ventilation without difficulty LMA: LMA inserted LMA Size: 4.0 Number of attempts: 1 Airway Equipment and Method: Bite block Placement Confirmation: positive ETCO2 Tube secured with: Tape Dental Injury: Teeth and Oropharynx as per pre-operative assessment

## 2021-03-15 NOTE — Anesthesia Postprocedure Evaluation (Signed)
Anesthesia Post Note  Patient: Tracy [name]  Procedure(s) Performed: ANTERIOR REPAIR (CYSTOCELE) (Vagina ) CYSTOSCOPY (Bladder)     Patient location during evaluation: PACU Anesthesia Type: General Level of consciousness: awake and alert Pain management: pain level controlled Vital Signs Assessment: post-procedure vital signs reviewed and stable Respiratory status: spontaneous breathing, nonlabored ventilation, respiratory function stable and patient connected to nasal cannula oxygen Cardiovascular status: blood pressure returned to baseline and stable Postop Assessment: no apparent nausea or vomiting Anesthetic complications: no   No notable events documented.  Last Vitals:  Vitals:   03/15/21 0930 03/15/21 0951  BP: (!) 101/38 (!) 103/49  Pulse: 82 78  Resp: 13 12  Temp:  (!) 36.4 C  SpO2: 98% 100%    Last Pain:  Vitals:   03/15/21 0951  TempSrc:   PainSc: 0-No pain                 Yareli Carthen

## 2021-03-15 NOTE — Op Note (Signed)
Operative Note  Preoperative Diagnosis: anterior vaginal prolapse  Postoperative Diagnosis: same  Procedures performed:  Anterior repair and cystoscopy  Implants: none  Attending Surgeon: Sherlene Shams, MD   Anesthesia: General LMA  Findings: 1. Stage II pelvic organ prolapse on vaginal exam  2. On cystoscopy, normal bladder and urethra without injury or lesion. Brisk bilateral ureteral efflux noted.    Specimens: none  Estimated blood loss: 20 mL  IV fluids: 1000 mL  Urine output: 123XX123 mL  Complications: none  Procedure in Detail:  After informed consent was obtained, the patient was taken to the operating room where general anesthesia was induced. She was placed in dorsal lithotomy position, taking care to avoid any traction of the extremities and prepped and draped in the usual sterile fashion. A self-retaining lonestar retractor was placed using four elastic blue stays.  After a foley catheter was inserted into the urethra, the location of the midurethra was palpated. Two Allis clamps were along the anterior vaginal wall defect. 1% lidocaine with epinephrine was injected into the vaginal mucosa.  A vertical incision was made between these two Allis clamps with a 15 blade scalpel.  Allis clamps were placed along this incision and Metzenbaum scissors were used to undermine the vaginal mucosa along the incision.  The vaginal mucosa was then sharply dissected off to the vesicovaginal septum bilaterally to the level of the pubic rami.  Anterior plication of the vesicovaginal septum was then performed using plicating sutures of 2-0 Vicryl. The vaginal mucosal edges were trimmed and the incision reapproximated with 2-0 Vicryl in a running fashion.    The Foley catheter was removed.  A 70-degree cystoscope was introduced, and 360-degree inspection revealed no injury, lesion or foreign body in the bladder.  Good bilateral ureteral efflux was noted.  The bladder was drained and the  cystoscope was removed.  The Foley catheter was reinserted.  The vagina was copiously irrigated and hemostasis was noted.  Vaginal packing was not placed.  The patient tolerated the procedure well.  She was awakened from anesthesia and transferred to the recovery room in stable condition. All counts were correct x 2.    [name], MD

## 2021-03-15 NOTE — Interval H&P Note (Signed)
History and Physical Interval Note:  03/15/2021 7:00 AM  Tracy [name]  has presented today for surgery, with the diagnosis of anterior vaginal wall prolapse, stress urinary incontinence.  The various methods of treatment have been discussed with the patient and family. After consideration of risks, benefits and other options for treatment, the patient has consented to  Procedure(s) with comments: ANTERIOR REPAIR (CYSTOCELE) (N/A) CYSTOSCOPY (N/A) TRANSVAGINAL TAPE (TVT) PROCEDURE (N/A) as a surgical intervention.  The patient's history has been reviewed, patient examined, no change in status, stable for surgery.  I have reviewed the patient's chart and labs.  Questions were answered to the patient's satisfaction.     [name]

## 2021-03-15 NOTE — Interval H&P Note (Signed)
History and Physical Interval Note:  03/15/2021 7:01 AM  Tracy [name]  has presented today for surgery, with the diagnosis of anterior vaginal wall prolapse, stress urinary incontinence.  The various methods of treatment have been discussed with the patient and family. After consideration of risks, benefits and other options for treatment, the patient has consented to  Procedure(s) with comments: ANTERIOR REPAIR (CYSTOCELE) (N/A) CYSTOSCOPY (N/A) as a surgical intervention.  The patient's history has been reviewed, patient examined, no change in status, stable for surgery.  I have reviewed the patient's chart and labs.  Questions were answered to the patient's satisfaction.     [name]

## 2021-03-16 ENCOUNTER — Encounter (HOSPITAL_BASED_OUTPATIENT_CLINIC_OR_DEPARTMENT_OTHER): Payer: Self-pay | Admitting: Obstetrics and Gynecology

## 2021-03-16 NOTE — Telephone Encounter (Signed)
Post- Op Call  Tracy [name] underwent anterior repair and cystoscopy on 03/15/21 with Dr Wannetta Sender. The patient reports that her pain is controlled. She is taking Tylenol. She reports vaginal bleeding but states that it is not a lot.  She has not had a bowel movement and is taking miralax for a bowel regimen. She was discharged without a catheter. Advised to call with any problems or concerns.   Blenda Nicely, RN

## 2021-03-25 ENCOUNTER — Other Ambulatory Visit: Payer: Self-pay | Admitting: Family Medicine

## 2021-03-25 ENCOUNTER — Telehealth: Payer: Self-pay | Admitting: Family Medicine

## 2021-03-25 DIAGNOSIS — E559 Vitamin D deficiency, unspecified: Secondary | ICD-10-CM

## 2021-03-25 DIAGNOSIS — M810 Age-related osteoporosis without current pathological fracture: Secondary | ICD-10-CM

## 2021-03-25 DIAGNOSIS — Z0000 Encounter for general adult medical examination without abnormal findings: Secondary | ICD-10-CM

## 2021-03-25 DIAGNOSIS — E538 Deficiency of other specified B group vitamins: Secondary | ICD-10-CM

## 2021-03-25 DIAGNOSIS — E785 Hyperlipidemia, unspecified: Secondary | ICD-10-CM

## 2021-03-25 NOTE — Telephone Encounter (Signed)
-----   Message from Cloyd Stagers, RT sent at 03/08/2021  4:04 PM EDT ----- Regarding: Lab Orders for Friday 9.2.2022 Please place lab orders for Friday 9.2.2022, office visit for physical on Friday 9.9.2022 Thank you, Dyke Maes RT(R)

## 2021-03-26 ENCOUNTER — Other Ambulatory Visit: Payer: Self-pay

## 2021-03-26 ENCOUNTER — Other Ambulatory Visit (INDEPENDENT_AMBULATORY_CARE_PROVIDER_SITE_OTHER): Payer: Medicare HMO

## 2021-03-26 DIAGNOSIS — Z0000 Encounter for general adult medical examination without abnormal findings: Secondary | ICD-10-CM

## 2021-03-26 DIAGNOSIS — E538 Deficiency of other specified B group vitamins: Secondary | ICD-10-CM

## 2021-03-26 DIAGNOSIS — E785 Hyperlipidemia, unspecified: Secondary | ICD-10-CM | POA: Diagnosis not present

## 2021-03-26 DIAGNOSIS — E559 Vitamin D deficiency, unspecified: Secondary | ICD-10-CM | POA: Diagnosis not present

## 2021-03-26 LAB — LIPID PANEL
Cholesterol: 174 mg/dL (ref 0–200)
HDL: 66.8 mg/dL (ref >39.00)
LDL Cholesterol: 88 mg/dL (ref 0–99)
NonHDL: 107.46
Total CHOL/HDL Ratio: 3
Triglycerides: 97 mg/dL (ref 0.0–149.0)
VLDL: 19.4 mg/dL (ref 0.0–40.0)

## 2021-03-26 LAB — CBC WITH DIFFERENTIAL/PLATELET
Basophils Absolute: 0 10*3/uL (ref 0.0–0.1)
Basophils Relative: 0.7 % (ref 0.0–3.0)
Eosinophils Absolute: 0.1 10*3/uL (ref 0.0–0.7)
Eosinophils Relative: 1.2 % (ref 0.0–5.0)
HCT: 41 % (ref 36.0–46.0)
Hemoglobin: 13.4 g/dL (ref 12.0–15.0)
Lymphocytes Relative: 40.8 % (ref 12.0–46.0)
Lymphs Abs: 2.9 10*3/uL (ref 0.7–4.0)
MCHC: 32.6 g/dL (ref 30.0–36.0)
MCV: 94.8 fl (ref 78.0–100.0)
Monocytes Absolute: 0.6 10*3/uL (ref 0.1–1.0)
Monocytes Relative: 8.7 % (ref 3.0–12.0)
Neutro Abs: 3.5 10*3/uL (ref 1.4–7.7)
Neutrophils Relative %: 48.6 % (ref 43.0–77.0)
Platelets: 194 10*3/uL (ref 150.0–400.0)
RBC: 4.32 Mil/uL (ref 3.87–5.11)
RDW: 13.9 % (ref 11.5–15.5)
WBC: 7.1 10*3/uL (ref 4.0–10.5)

## 2021-03-26 LAB — COMPREHENSIVE METABOLIC PANEL
ALT: 21 U/L (ref 0–35)
AST: 23 U/L (ref 0–37)
Albumin: 3.8 g/dL (ref 3.5–5.2)
Alkaline Phosphatase: 41 U/L (ref 39–117)
BUN: 12 mg/dL (ref 6–23)
CO2: 30 mEq/L (ref 19–32)
Calcium: 9 mg/dL (ref 8.4–10.5)
Chloride: 107 mEq/L (ref 96–112)
Creatinine, Ser: 0.65 mg/dL (ref 0.40–1.20)
GFR: 89.87 mL/min (ref >60.00)
Glucose, Bld: 80 mg/dL (ref 70–99)
Potassium: 4 mEq/L (ref 3.5–5.1)
Sodium: 143 mEq/L (ref 135–145)
Total Bilirubin: 0.5 mg/dL (ref 0.2–1.2)
Total Protein: 5.9 g/dL — ABNORMAL LOW (ref 6.0–8.3)

## 2021-03-26 LAB — VITAMIN D 25 HYDROXY (VIT D DEFICIENCY, FRACTURES): VITD: 43.7 ng/mL (ref 30.00–100.00)

## 2021-03-26 LAB — VITAMIN B12: Vitamin B-12: 821 pg/mL (ref 211–911)

## 2021-03-26 LAB — TSH: TSH: 2.41 u[IU]/mL (ref 0.35–5.50)

## 2021-03-30 ENCOUNTER — Ambulatory Visit: Payer: Medicare HMO

## 2021-03-31 ENCOUNTER — Encounter: Payer: Self-pay | Admitting: *Deleted

## 2021-04-02 ENCOUNTER — Encounter: Payer: Medicare HMO | Admitting: Family Medicine

## 2021-04-12 DIAGNOSIS — H524 Presbyopia: Secondary | ICD-10-CM | POA: Diagnosis not present

## 2021-04-19 DIAGNOSIS — Z0100 Encounter for examination of eyes and vision without abnormal findings: Secondary | ICD-10-CM | POA: Diagnosis not present

## 2021-04-23 ENCOUNTER — Other Ambulatory Visit: Payer: Self-pay | Admitting: Family Medicine

## 2021-04-28 ENCOUNTER — Ambulatory Visit (INDEPENDENT_AMBULATORY_CARE_PROVIDER_SITE_OTHER): Payer: Medicare HMO | Admitting: Obstetrics and Gynecology

## 2021-04-28 ENCOUNTER — Other Ambulatory Visit: Payer: Self-pay

## 2021-04-28 ENCOUNTER — Encounter: Payer: Self-pay | Admitting: Obstetrics and Gynecology

## 2021-04-28 VITALS — BP 121/74 | HR 68 | Ht 62.0 in | Wt 110.0 lb

## 2021-04-28 DIAGNOSIS — Z9889 Other specified postprocedural states: Secondary | ICD-10-CM

## 2021-04-28 NOTE — Progress Notes (Signed)
[facility]  Date of Visit: 04/28/2021  History of Present Illness: Ms. Storbeck is a 70 y.o. female scheduled today for a post-operative visit.   Surgery: s/p anterior repair and cystoscopy on 03/15/21  She passed her postoperative void trial.   Postoperative course has been uncomplicated.   Today she reports having some yellowish sticky discharge. Also having some fatigue- has a history of chronic fatigue.   UTI in the last 6 weeks? No  Pain? Yes  She has returned to her normal activity (except for postop restrictions).  Vaginal bulge? No  Stress incontinence: No  Urgency/frequency: No  Urge incontinence: No  Voiding dysfunction: No  Bowel issues: No   Subjective Success: Do you usually have a bulge or something falling out that you can see or feel in the vaginal area? No  Retreatment Success: Any retreatment with surgery or pessary for any compartment? No    Medications: She has a current medication list which includes the following prescription(s): acetaminophen, calcium-vitamin d-vitamin k, vitamin d, estradiol, raloxifene, simvastatin, triamcinolone cream, and vitamin b-12.   Allergies: Patient is allergic to alendronate sodium, amoxicillin, ivp dye [iodinated diagnostic agents], and motrin [ibuprofen].   Physical Exam: BP 121/74   Pulse 68   Ht 5\' 2"  (1.575 m)   Wt 110 lb (49.9 kg)   BMI 20.12 kg/m   Abdomen: soft, non-tender, without masses or organomegaly Pelvic Examination: Vagina: Incisions healing well. Sutures are present at incision line and there is not granulation tissue. No tenderness along the anterior or posterior vagina. No apical tenderness. No pelvic masses.  POP-Q: POP-Q  -2.5                                            Aa   -2.5                                           Ba  -6                                              C   2.5                                            Gh  3                                            Pb  6.5                                             tvl   -3  Ap  -3                                            Bp                                                 D    ---------------------------------------------------------  Assessment and Plan:  1. Post-operative state      - The patient was given a copy of her operative note for her records. - Can resume regular activity gradually. Wait 2 additional weeks for intercourse.  - continue vaginal estrogen cream twice a week - Discussed avoidance of heavy lifting and straining long term to reduce the risk of recurrence.   Follow up as needed.   [name], MD

## 2021-05-06 ENCOUNTER — Ambulatory Visit (INDEPENDENT_AMBULATORY_CARE_PROVIDER_SITE_OTHER): Payer: Medicare HMO | Admitting: Family Medicine

## 2021-05-06 ENCOUNTER — Other Ambulatory Visit: Payer: Self-pay

## 2021-05-06 ENCOUNTER — Encounter: Payer: Self-pay | Admitting: Family Medicine

## 2021-05-06 VITALS — BP 102/68 | HR 59 | Temp 98.2°F | Ht 62.0 in | Wt 114.0 lb

## 2021-05-06 DIAGNOSIS — G9332 Myalgic encephalomyelitis/chronic fatigue syndrome: Secondary | ICD-10-CM | POA: Diagnosis not present

## 2021-05-06 DIAGNOSIS — E559 Vitamin D deficiency, unspecified: Secondary | ICD-10-CM | POA: Diagnosis not present

## 2021-05-06 DIAGNOSIS — N898 Other specified noninflammatory disorders of vagina: Secondary | ICD-10-CM | POA: Diagnosis not present

## 2021-05-06 DIAGNOSIS — Z0000 Encounter for general adult medical examination without abnormal findings: Secondary | ICD-10-CM | POA: Diagnosis not present

## 2021-05-06 DIAGNOSIS — M810 Age-related osteoporosis without current pathological fracture: Secondary | ICD-10-CM | POA: Diagnosis not present

## 2021-05-06 DIAGNOSIS — E538 Deficiency of other specified B group vitamins: Secondary | ICD-10-CM | POA: Diagnosis not present

## 2021-05-06 DIAGNOSIS — Z1231 Encounter for screening mammogram for malignant neoplasm of breast: Secondary | ICD-10-CM

## 2021-05-06 DIAGNOSIS — E7800 Pure hypercholesterolemia, unspecified: Secondary | ICD-10-CM

## 2021-05-06 MED ORDER — ESTRADIOL 0.1 MG/GM VA CREA: 1.0000 | TOPICAL_CREAM | VAGINAL | 3 refills | Status: DC

## 2021-05-06 NOTE — Assessment & Plan Note (Signed)
Lab Results  Component Value Date   [id number] 505 03/26/2021   Oral supplementation 500 mcg daily
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
## 2021-05-06 NOTE — Assessment & Plan Note (Signed)
Disc goals for lipids and reasons to control them Rev last labs with pt Rev low sat fat diet in detail Plan to continue simvastatin 40 mg daily

## 2021-05-06 NOTE — Assessment & Plan Note (Signed)
S/p recent pelvic issues and surgery =working to slowly get back to baseline/fatigued

## 2021-05-06 NOTE — Assessment & Plan Note (Signed)
Referral done for mammogram, pt will schedule  Nl breast exam
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
## 2021-05-06 NOTE — Assessment & Plan Note (Signed)
Reviewed health habits including diet and exercise and skin cancer prevention Reviewed appropriate screening tests for age  Also reviewed health mt list, fam hx and immunization status , as well as social and family history   See HPI Colonoscopy utd , pt declines cologuard in interim Declines flu vaccine  covid immunized  Declines shingrix  dexa 10/21 reviewed , no falls or fractures  Good exercise habit  Given materials to work on adv directive No cognitive concerns PHQ score of 0 No problems with ADLs

## 2021-05-06 NOTE — Assessment & Plan Note (Signed)
D level of 43.7 with current supplementation Vitamin D level is therapeutic with current supplementation Disc importance of this to bone and overall health

## 2021-05-06 NOTE — Patient Instructions (Addendum)
If you are interested in the new shingles vaccine (Shingrix) - call your local pharmacy to check on coverage and availability  If affordable, get on a wait list at your pharmacy to get the vaccine.  Take care of yourself   If you get down and want some counseling let us know  Stay as active as you can tolerate

## 2021-05-06 NOTE — Assessment & Plan Note (Signed)
dexa 10/21 Back on evista after a drug holiday Tolerating well Taking vit D and level is therapeutic No falls or fractures  Walking as tolerated for exercise (also recent PT)

## 2021-05-18 ENCOUNTER — Ambulatory Visit
Admission: RE | Admit: 2021-05-18 | Discharge: 2021-05-18 | Disposition: A | Discharge status: Home / Self Care | Payer: Medicare HMO | Source: Ambulatory Visit | Attending: Family Medicine | Admitting: Family Medicine

## 2021-05-18 ENCOUNTER — Other Ambulatory Visit: Payer: Self-pay

## 2021-05-18 DIAGNOSIS — Z1231 Encounter for screening mammogram for malignant neoplasm of breast: Secondary | ICD-10-CM | POA: Insufficient documentation

## 2021-07-22 ENCOUNTER — Other Ambulatory Visit: Payer: Self-pay | Admitting: Family Medicine

## 2021-07-30 ENCOUNTER — Telehealth: Payer: Self-pay | Admitting: *Deleted

## 2021-07-30 NOTE — Telephone Encounter (Signed)
PLEASE NOTE: All timestamps contained within this report are represented as [place] Standard Time. CONFIDENTIALTY NOTICE: This fax transmission is intended only for the addressee. It contains information that is legally privileged, confidential or otherwise protected from use or disclosure. If you are not the intended recipient, you are strictly prohibited from reviewing, disclosing, copying using or disseminating any of this information or taking any action in reliance on or regarding this information. If you have received this fax in error, please notify us immediately by telephone so that we can arrange for its return to [place]. Phone: [phone], Toll-Free: [phone], Fax: [phone] Page: 1 of 3 Call Id: [id number] Gary Day - Client TELEPHONE ADVICE RECORD [facility] Patient Name: Tracy [name] Gender: Female DOB: 07-Nov-1950 Age: 71 Y 79 M 12 D Return Phone Number: [phone] (Primary) Address: City/ State/ Zip: [place]  [address] Client [facility] - Client Client Site [facility], Roque Lias - MD Contact Type Call Who Is Calling Patient / Member / Family / Caregiver Call Type Triage / Clinical Relationship To Patient Self Return Phone Number [phone] (Primary) Chief Complaint Sore Throat Reason for Call Symptomatic / Request for Health Information Initial Comment caller states that her husband had covid and thinks she has it although her home COVID test was negative today. She has congestion, low grade fever, sore throat, not sleeping well. No appointments available today for Dr. Glori Bickers. Translation No Nurse Assessment Nurse: Nicki Reaper, RN, Malachy Mood Date/Time (Eastern Time): 07/30/2021 11:05:05 AM Confirm and document reason for call. If symptomatic, describe symptoms. ---Caller states her husband tested positive for Covid on, Tuesday and she tested negative for Covid  today via home test, has congestion, sore throat and waking up several times, rates pain 3/10, has not taken anything for pain, had a little bit of coughing with sneezing yesterday, and has coughed today 1 time, low grade fever, Current Temp-100 orally, , symptoms started on Wednesday with a scratchy throat and tested negative on Tuesday for Covid, denies cough, CP/ Pressure and other symptoms, drinking fluids and voiding Does the patient have any new or worsening symptoms? ---Yes Will a triage be completed? ---Yes Related visit to physician within the last 2 weeks? ---No Does the PT have any chronic conditions? (i.e. diabetes, asthma, this includes High risk factors for pregnancy, etc.) ---Yes List chronic conditions. ---Chronic fatigue Is this a behavioral health or substance abuse call? ---No PLEASE NOTE: All timestamps contained within this report are represented as [place] Standard Time. CONFIDENTIALTY NOTICE: This fax transmission is intended only for the addressee. It contains information that is legally privileged, confidential or otherwise protected from use or disclosure. If you are not the intended recipient, you are strictly prohibited from reviewing, disclosing, copying using or disseminating any of this information or taking any action in reliance on or regarding this information. If you have received this fax in error, please notify us immediately by telephone so that we can arrange for its return to [place]. Phone: [phone], Toll-Free: [phone], Fax: [phone] Page: 2 of 3 Call Id: [id number] Guidelines Guideline Title Affirmed Question Affirmed Notes Nurse Date/Time Eilene Ghazi Time) COVID-19 - Diagnosed or Suspected [1] HIGH RISK for severe COVID complications (e.g., weak immune system, age > 12 years, obesity with BMI 30 or higher, pregnant, chronic lung disease or other chronic medical condition) AND [2] COVID symptoms (e.g., cough,  fever) (Exceptions: Already seen by PCP and no new or worsening  symptoms.) Baltazar Apo 07/30/2021 11:08:57 AM Disp. Time Eilene Ghazi Time) Disposition Final User 07/30/2021 10:48:11 AM Attempt made - message left Baltazar Apo 07/30/2021 11:15:44 AM Call PCP within 24 Hours Yes Nicki Reaper, RN, Erskine Speed Disagree/Comply Comply Caller Understands Yes PreDisposition Call Doctor Care Advice Given Per Guideline CALL PCP WITHIN 24 HOURS: * You need to discuss this with your doctor (or NP/PA) within the next 24 hours. * For fevers above 101 F (38.3 C) take either acetaminophen or ibuprofen. FEVER MEDICINES: * IBUPROFEN (E.G., MOTRIN, ADVIL): Take 400 mg (two 200 mg pills) by mouth every 6 hours. The most you should take is 6 pills a day (1,200 mg total). * ACETAMINOPHEN - EXTRA STRENGTH TYLENOL: Take 1,000 mg (two 500 mg pills) every 6 to 8 hours as needed. Each Extra Strength Tylenol pill has 500 mg of acetaminophen. The most you should take is 6 pills a day (3,000 mg total). Note: In [place], the maximum is 8 pills a day (4,000 mg total). CALL BACK IF: * You become worse Comments User: Burna Sis, RN Date/Time (Eastern Time): 07/30/2021 11:17:05 AM Extended wait time for office, warm transferred caller to appt line and instructed to let them know she spoke with tirag nurse and will need to be seen in next 24 hrs and that her husband tested positive for covid Referrals REFERRED TO PCP OFFICE PLEASE NOTE: All timestamps contained within this report are represented as [place] Standard Time. CONFIDENTIALTY NOTICE: This fax transmission is intended only for the addressee. It contains information that is legally privileged, confidential or otherwise protected from use or disclosure. If you are not the intended recipient, you are strictly prohibited from reviewing, disclosing, copying using or disseminating any of this information or taking any action in reliance on or regarding this information.  If you have received this fax in error, please notify us immediately by telephone so that we can arrange for its return to [place]. Phone: [phone], Toll-Free: [phone], Fax: [phone] Page: 3 of 3 Call Id: [id number]

## 2021-07-30 NOTE — Telephone Encounter (Signed)
Pt notified of Dr. Marliss Coots comments and verbalized understanding. Pt said she's not feeling to bad right now so will stick to OTC meds but if sxs worsen will go to UC

## 2021-07-30 NOTE — Telephone Encounter (Signed)
We cannot treat with an antiviral without a positive covid test.  I recommend visit to UC/ they may have to do PCR covid test  Agree with ER precautions

## 2021-07-30 NOTE — Telephone Encounter (Signed)
Spoke to patient by telephone and was advised that her husband was not given anti-viral medication because he was out of the window for the medication. Patient stated that she has done two covid test and both have been negative with the second one this morning. Patient stated that she just has a low grade fever and sore throat. Patient denies SOB or difficulty breathing. Patient was given ER precautions and she verbalized understanding.

## 2021-07-31 DIAGNOSIS — J029 Acute pharyngitis, unspecified: Secondary | ICD-10-CM | POA: Diagnosis not present

## 2021-07-31 DIAGNOSIS — J069 Acute upper respiratory infection, unspecified: Secondary | ICD-10-CM | POA: Diagnosis not present

## 2021-07-31 DIAGNOSIS — R509 Fever, unspecified: Secondary | ICD-10-CM | POA: Diagnosis not present

## 2021-07-31 DIAGNOSIS — Z20822 Contact with and (suspected) exposure to covid-19: Secondary | ICD-10-CM | POA: Diagnosis not present

## 2021-08-19 DIAGNOSIS — D485 Neoplasm of uncertain behavior of skin: Secondary | ICD-10-CM | POA: Diagnosis not present

## 2021-08-19 DIAGNOSIS — D2262 Melanocytic nevi of left upper limb, including shoulder: Secondary | ICD-10-CM | POA: Diagnosis not present

## 2021-08-19 DIAGNOSIS — D2272 Melanocytic nevi of left lower limb, including hip: Secondary | ICD-10-CM | POA: Diagnosis not present

## 2021-08-19 DIAGNOSIS — L821 Other seborrheic keratosis: Secondary | ICD-10-CM | POA: Diagnosis not present

## 2021-08-19 DIAGNOSIS — D2261 Melanocytic nevi of right upper limb, including shoulder: Secondary | ICD-10-CM | POA: Diagnosis not present

## 2021-08-19 DIAGNOSIS — D2271 Melanocytic nevi of right lower limb, including hip: Secondary | ICD-10-CM | POA: Diagnosis not present

## 2021-08-19 DIAGNOSIS — D225 Melanocytic nevi of trunk: Secondary | ICD-10-CM | POA: Diagnosis not present

## 2021-08-19 DIAGNOSIS — L570 Actinic keratosis: Secondary | ICD-10-CM | POA: Diagnosis not present

## 2021-09-06 DIAGNOSIS — L570 Actinic keratosis: Secondary | ICD-10-CM | POA: Diagnosis not present

## 2021-11-03 ENCOUNTER — Ambulatory Visit (INDEPENDENT_AMBULATORY_CARE_PROVIDER_SITE_OTHER): Payer: Medicare HMO | Admitting: Family Medicine

## 2021-11-03 ENCOUNTER — Encounter: Payer: Self-pay | Admitting: Family Medicine

## 2021-11-03 VITALS — BP 114/62 | HR 72 | Temp 98.0°F | Ht 62.0 in | Wt 116.1 lb

## 2021-11-03 DIAGNOSIS — E538 Deficiency of other specified B group vitamins: Secondary | ICD-10-CM | POA: Diagnosis not present

## 2021-11-03 DIAGNOSIS — R1032 Left lower quadrant pain: Secondary | ICD-10-CM | POA: Insufficient documentation

## 2021-11-03 DIAGNOSIS — R202 Paresthesia of skin: Secondary | ICD-10-CM | POA: Insufficient documentation

## 2021-11-03 DIAGNOSIS — R102 Pelvic and perineal pain: Secondary | ICD-10-CM

## 2021-11-03 DIAGNOSIS — R300 Dysuria: Secondary | ICD-10-CM | POA: Diagnosis not present

## 2021-11-03 DIAGNOSIS — R195 Other fecal abnormalities: Secondary | ICD-10-CM | POA: Diagnosis not present

## 2021-11-03 DIAGNOSIS — N898 Other specified noninflammatory disorders of vagina: Secondary | ICD-10-CM | POA: Insufficient documentation

## 2021-11-03 DIAGNOSIS — N952 Postmenopausal atrophic vaginitis: Secondary | ICD-10-CM

## 2021-11-03 LAB — POC URINALSYSI DIPSTICK (AUTOMATED)
Bilirubin, UA: NEGATIVE
Blood, UA: NEGATIVE
Glucose, UA: NEGATIVE
Ketones, UA: NEGATIVE
Leukocytes, UA: NEGATIVE
Nitrite, UA: NEGATIVE
Protein, UA: NEGATIVE
Spec Grav, UA: 1.015 (ref 1.010–1.025)
Urobilinogen, UA: 0.2 E.U./dL
pH, UA: 8 (ref 5.0–8.0)

## 2021-11-03 LAB — CBC WITH DIFFERENTIAL/PLATELET
Basophils Absolute: 0.1 10*3/uL (ref 0.0–0.1)
Basophils Relative: 0.9 % (ref 0.0–3.0)
Eosinophils Absolute: 0.1 10*3/uL (ref 0.0–0.7)
Eosinophils Relative: 2.2 % (ref 0.0–5.0)
HCT: 40.9 % (ref 36.0–46.0)
Hemoglobin: 13.5 g/dL (ref 12.0–15.0)
Lymphocytes Relative: 31.5 % (ref 12.0–46.0)
Lymphs Abs: 2 10*3/uL (ref 0.7–4.0)
MCHC: 32.9 g/dL (ref 30.0–36.0)
MCV: 94.4 fl (ref 78.0–100.0)
Monocytes Absolute: 0.6 10*3/uL (ref 0.1–1.0)
Monocytes Relative: 9.6 % (ref 3.0–12.0)
Neutro Abs: 3.5 10*3/uL (ref 1.4–7.7)
Neutrophils Relative %: 55.8 % (ref 43.0–77.0)
Platelets: 184 10*3/uL (ref 150.0–400.0)
RBC: 4.33 Mil/uL (ref 3.87–5.11)
RDW: 14.5 % (ref 11.5–15.5)
WBC: 6.4 10*3/uL (ref 4.0–10.5)

## 2021-11-03 LAB — POCT WET PREP (WET MOUNT): Trichomonas Wet Prep HPF POC: ABSENT

## 2021-11-03 LAB — COMPREHENSIVE METABOLIC PANEL
ALT: 14 U/L (ref 0–35)
AST: 19 U/L (ref 0–37)
Albumin: 4 g/dL (ref 3.5–5.2)
Alkaline Phosphatase: 34 U/L — ABNORMAL LOW (ref 39–117)
BUN: 8 mg/dL (ref 6–23)
CO2: 33 mEq/L — ABNORMAL HIGH (ref 19–32)
Calcium: 9 mg/dL (ref 8.4–10.5)
Chloride: 106 mEq/L (ref 96–112)
Creatinine, Ser: 0.65 mg/dL (ref 0.40–1.20)
GFR: 89.49 mL/min (ref >60.00)
Glucose, Bld: 80 mg/dL (ref 70–99)
Potassium: 4.2 mEq/L (ref 3.5–5.1)
Sodium: 144 mEq/L (ref 135–145)
Total Bilirubin: 0.4 mg/dL (ref 0.2–1.2)
Total Protein: 5.8 g/dL — ABNORMAL LOW (ref 6.0–8.3)

## 2021-11-03 LAB — VITAMIN B12: Vitamin B-12: 913 pg/mL — ABNORMAL HIGH (ref 211–911)

## 2021-11-03 LAB — TSH: TSH: 1.29 u[IU]/mL (ref 0.35–5.50)

## 2021-11-03 NOTE — Assessment & Plan Note (Signed)
On/off ?B12 and TSH added to labs ?Nl exam ?

## 2021-11-03 NOTE — Assessment & Plan Note (Signed)
Some leg paresthesias ?B12 level ordered  ?

## 2021-11-03 NOTE — Assessment & Plan Note (Signed)
LLQ and pelvic area pain  ?No tenderness today, some pain with hip ext rotation  ?Diff incl GI/diverticular/constipation, vaginitis(wet prep neg), urinary (ua neg) or hip (rev last hip xray 2021 and she saw ortho) ?Reassuring w/u so far  ?Labs pending  ?Plan to follow ?

## 2021-11-03 NOTE — Patient Instructions (Addendum)
Exam today -normal  ?Urinalysis today -normal ? ?Vaginal swab today -normal ? ? ?Labs today  ? ?Use miralax daily for a week  ?Drink lots of fluid  ? ? ? ? ? ? ?

## 2021-11-03 NOTE — Assessment & Plan Note (Addendum)
One episode/mild  ?Had scant vaginal discharge with blood one time  ?ua is entirely clear today ? ? ?

## 2021-11-03 NOTE — Assessment & Plan Note (Signed)
Had pellet stools on several occasions ?inst to try miralax ?Unsure if cause of LLQ pain  ?Labs pending  ? ?Rev last colonoscopy report-difficult to read/poor scan but no prev hx of diverticular dz ?

## 2021-11-03 NOTE — Assessment & Plan Note (Addendum)
L sided/low abd to pelvis  ?See A/P of LLQ pain  ? ?Wet prep and ua neg ?Has had hysterectomy ?Nl exam (not speculum) ,  ? If this has to do with her cystocele surgery  ?May need f/u with urogyn ? ?

## 2021-11-03 NOTE — Progress Notes (Signed)
? ?Subjective:  ? ? Patient ID: Tracy [name], female    DOB: 1951-04-25, 71 y.o.   MRN: [id number] ? ?HPI ?Pt presents for L side pain for several weeks  ? ?Wt Readings from Last 3 Encounters:  ?11/03/21 116 lb 2 oz (52.7 kg)  ?05/06/21 114 lb (51.7 kg)  ?04/28/21 110 lb (49.9 kg)  ? ?21.24 kg/m? ? ?Was sexually active with husband 2 wk ago  ?Tiny bit of blood when she wiped ?Burned to urinate once /very slight  ?Then some dark colored mucus  ?Then some pain in L side  ?This is on/off  ?Aches at times  ?Occ worse with movement/if she steps the wrong way  ?Occ hurts in the middle of the night and then radiates to her back  ? ?Bowels changed 5-6 wk ago/ harder/ in little balls ?Had been eating more m and m peanut candy ?Also white bread  ? ?Since then - more fiber and stopped the m and ms  ? ? ? ?H/o cystocele repair and total hysterectomy  ?? If something got damaged with the cystocele repair  ?Dr Ruby Cola is her uro gynecologist  ? ?Also both legs feel weird from the knee down  ?Not numb , perhaps tingly ?Last few weeks also the soles of her feet are red  ?Lab Results  ?Component Value Date  ? [id number] 821 03/26/2021  ? ? ? ?Wears lift in L shoe/leg is shorter slightly ? ? ?Has h/o scoliosis and OP ? ? ?Had CT abd/pelvis in 2021 ?Had 4 mm angiomyolipoma at upper pole of L kidney  ?Cystocele ?Otherwise nl  ? ?Uses estrace vaginal cream for atrophic vaginitis  ? ? ?Results for orders placed or performed in visit on 11/03/21  ?POCT Urinalysis Dipstick (Automated)  ?Result Value Ref Range  ? Color, UA Yellow   ? Clarity, UA Clear   ? Glucose, UA Negative Negative  ? Bilirubin, UA Negative   ? Ketones, UA Negative   ? Spec Grav, UA 1.015 1.010 - 1.025  ? Blood, UA Negative   ? pH, UA 8.0 5.0 - 8.0  ? Protein, UA Negative Negative  ? Urobilinogen, UA 0.2 0.2 or 1.0 E.U./dL  ? Nitrite, UA Negative   ? Leukocytes, UA Negative Negative  ?POCT Wet Prep [facility])  ?Result Value Ref Range  ? Source Wet Prep POC vaginal    ? WBC, Wet Prep HPF POC none   ? Bacteria Wet Prep HPF POC None (A) Few  ? BACTERIA WET PREP MORPHOLOGY POC    ? Clue Cells Wet Prep HPF POC None None  ? Clue Cells Wet Prep Whiff POC    ? Yeast Wet Prep HPF POC None None  ? KOH Wet Prep POC None None  ? Trichomonas Wet Prep HPF POC Absent Absent  ?  ?Patient Active Problem List  ? Diagnosis Date Noted  ? Dysuria 11/03/2021  ? Abdominal pain, left lower quadrant 11/03/2021  ? Tingling of both feet 11/03/2021  ? Vaginal discharge 11/03/2021  ? Hard stool 11/03/2021  ? Medicare annual wellness visit, subsequent 05/06/2021  ? Vaginal irritation 04/15/2020  ? Pelvic pain 04/15/2020  ? Angiomyolipoma of left kidney 03/11/2020  ? Welcome to Medicare preventive visit 03/03/2017  ? Estrogen deficiency 03/03/2017  ? Borderline low blood pressure determined by examination 10/31/2016  ? Vitamin B12 deficiency 10/31/2016  ? Chest pain with moderate risk for cardiac etiology 10/17/2014  ? Colon cancer screening 02/17/2014  ? Encounter for screening  mammogram for breast cancer 02/07/2011  ? Routine general medical examination at a health care facility 02/01/2011  ? Vitamin D deficiency 04/14/2008  ? Osteoporosis 01/09/2007  ? Hyperlipidemia 01/04/2007  ? Atrophic vaginitis 01/04/2007  ? SCOLIOSIS 01/04/2007  ? CHRONIC FATIGUE SYNDROME 01/04/2007  ? ?Past Medical History:  ?Diagnosis Date  ? Chronic fatigue syndrome   ? Frequency of urination   ? Hemorrhoids   ? Hiatal hernia   ? History of gastroesophageal reflux (GERD)   ? w/ hx gastritis  ? Hyperlipidemia   ? Osteoporosis   ? Prolapse of anterior vaginal wall   ? Wears glasses   ? ?Past Surgical History:  ?Procedure Laterality Date  ? COLONOSCOPY  2015  ? CYSTOCELE REPAIR N/A 03/15/2021  ? Procedure: ANTERIOR REPAIR (CYSTOCELE);  Surgeon: [name], MD;  Location: [facility];  Service: Gynecology;  Laterality: N/A;  total time requested 1.5hrs  ? CYSTOSCOPY N/A 03/15/2021  ? Procedure: CYSTOSCOPY;   Surgeon: [name], MD;  Location: [facility];  Service: Gynecology;  Laterality: N/A;  ? DILATION AND CURETTAGE OF UTERUS    ? yrs ago per pt for missed ab  ? ESOPHAGOGASTRODUODENOSCOPY  10/2015  ? TOTAL ABDOMINAL HYSTERECTOMY  1991  ? per pt w/ USO,  unsure which side  ? ?Social History  ? ?Tobacco Use  ? Smoking status: Never  ? Smokeless tobacco: Never  ?Vaping Use  ? Vaping Use: Never used  ?Substance Use Topics  ? Alcohol use: No  ?  Alcohol/week: 0.0 standard drinks  ? Drug use: Never  ? ?Family History  ?Problem Relation Age of Onset  ? Stroke Mother   ? Hypertension Father   ? Stroke Father   ? Hyperlipidemia Sister   ? Hyperlipidemia Brother   ? Cancer Paternal Uncle   ? Cancer Maternal Grandmother   ?     intestinal CA  ? Cancer Paternal Grandfather   ?     CA unsure which kind  ? Hyperlipidemia Brother   ? Heart attack Sister   ?     Thought to be silent MI  ? Breast cancer Neg Hx   ? ?Allergies  ?Allergen Reactions  ? Alendronate Sodium   ?  REACTION: GI issues  ? Amoxicillin Nausea And Vomiting  ? Ivp Dye [Iodinated Contrast Media] Rash  ? Motrin [Ibuprofen] Rash  ?  PER PT ONLY MOTRIN BRAND, THIS HAPPENED MANY YRS AGO.  CAN TAKE ADVIL BRAND ONLY   ? ?Current Outpatient Medications on File Prior to Visit  ?Medication Sig Dispense Refill  ? Calcium-Vitamin D-Vitamin K 650-12.5-40 MG-MCG-MCG CHEW Chew 2 tablets by mouth 2 (two) times daily.    ? Cholecalciferol (VITAMIN D) 2000 UNITS CAPS Take 1 capsule by mouth daily.    ? estradiol (ESTRACE) 0.1 MG/GM vaginal cream Place 1 Applicatorful vaginally 2 (two) times a week. 42.5 g 3  ? raloxifene (EVISTA) 60 MG tablet TAKE 1 TABLET BY MOUTH EVERY DAY 90 tablet 2  ? simvastatin (ZOCOR) 40 MG tablet TAKE (1) TABLET BY MOUTH DAILY AT BEDTIME 90 tablet 2  ? vitamin B-12 (CYANOCOBALAMIN) 500 MCG tablet Take 500 mcg by mouth daily.    ? ?No current facility-administered medications on file prior to visit.  ?  ?Review of Systems   ?Constitutional:  Negative for activity change, appetite change, fatigue, fever and unexpected weight change.  ?HENT:  Negative for congestion, ear pain, rhinorrhea, sinus pressure and sore throat.   ?  Eyes:  Negative for pain, redness and visual disturbance.  ?Respiratory:  Negative for cough, shortness of breath and wheezing.   ?Cardiovascular:  Negative for chest pain and palpitations.  ?Gastrointestinal:  Positive for constipation. Negative for abdominal pain, blood in stool and diarrhea.  ?Endocrine: Negative for polydipsia and polyuria.  ?Genitourinary:  Positive for pelvic pain and vaginal discharge. Negative for decreased urine volume, dysuria, flank pain, frequency, menstrual problem, urgency, vaginal bleeding and vaginal pain.  ?Musculoskeletal:  Negative for arthralgias, back pain and myalgias.  ?Skin:  Negative for pallor and rash.  ?Allergic/Immunologic: Negative for environmental allergies.  ?Neurological:  Negative for dizziness, syncope and headaches.  ?     Funny feeling in lower legs and feet-not quite numb   ?Hematological:  Negative for adenopathy. Does not bruise/bleed easily.  ?Psychiatric/Behavioral:  Negative for decreased concentration and dysphoric mood. The patient is not nervous/anxious.   ? ?   ?Objective:  ? Physical Exam ?Exam conducted with a chaperone present.  ?Constitutional:   ?   General: She is not in acute distress. ?   Appearance: Normal appearance. She is normal weight. She is not ill-appearing.  ?HENT:  ?   Head: Normocephalic and atraumatic.  ?   Mouth/Throat:  ?   Mouth: Mucous membranes are moist.  ?Eyes:  ?   General:     ?   Right eye: No discharge.     ?   Left eye: No discharge.  ?   Conjunctiva/sclera: Conjunctivae normal.  ?   Pupils: Pupils are equal, round, and reactive to light.  ?Cardiovascular:  ?   Rate and Rhythm: Normal rate and regular rhythm.  ?   Heart sounds: Normal heart sounds.  ?Pulmonary:  ?   Effort: Pulmonary effort is normal. No respiratory  distress.  ?   Breath sounds: Normal breath sounds. No wheezing.  ?Abdominal:  ?   General: Abdomen is flat. Bowel sounds are normal. There is no distension or abdominal bruit.  ?   Palpations: Abdomen is rigid. Terie Purser

## 2021-11-03 NOTE — Assessment & Plan Note (Addendum)
Has used estrace cream ?occ irritation  ?Nl wet prep today ? ? ?

## 2021-11-03 NOTE — Assessment & Plan Note (Signed)
With atrophy  ?Uses est cream twice weekly  ?Nl wet prep today ?

## 2021-11-19 ENCOUNTER — Telehealth: Payer: Self-pay

## 2021-11-19 DIAGNOSIS — R1032 Left lower quadrant pain: Secondary | ICD-10-CM

## 2021-11-19 NOTE — Telephone Encounter (Signed)
Patient was seen for abdominal pain. Told to take miralax for one week unless starts having loose stools. She was able to take for 3 days but started having loose stools. She is still having abdominal pain. She has had changes in stools. Not constipation but smaller amounts that are more frequent. She wanted to know if you think she needs to be seen by GI.   ?

## 2021-11-19 NOTE — Telephone Encounter (Signed)
Pt notified of Dr. Marliss Coots comments. Pt has seen GI at [facility] in the past and if possible she would like to be referred back there. Pt will keep [place] posted regarding sxs. Pt advised if she hasn't heard anything regarding referral in 2 weeks to let us know ?

## 2021-11-19 NOTE — Telephone Encounter (Signed)
I think that would be a good idea.  Does she prefer [place] or [place]?     Let me know if any new symptoms like fever/blood in stool/nausea etc. ?

## 2021-11-21 NOTE — Telephone Encounter (Signed)
GI referral done

## 2021-11-21 NOTE — Addendum Note (Signed)
Addended by: Loura Pardon A on: 11/21/2021 04:08 PM ? ? Modules accepted: Orders ? ?

## 2021-12-03 NOTE — Telephone Encounter (Signed)
Referral sent - pt made aware via mychart. ?

## 2021-12-09 ENCOUNTER — Encounter: Payer: Self-pay | Admitting: Obstetrics and Gynecology

## 2021-12-09 ENCOUNTER — Ambulatory Visit (INDEPENDENT_AMBULATORY_CARE_PROVIDER_SITE_OTHER): Payer: Medicare HMO | Admitting: Obstetrics and Gynecology

## 2021-12-09 VITALS — BP 147/81 | HR 66

## 2021-12-09 DIAGNOSIS — R102 Pelvic and perineal pain: Secondary | ICD-10-CM | POA: Diagnosis not present

## 2021-12-09 DIAGNOSIS — R300 Dysuria: Secondary | ICD-10-CM

## 2021-12-09 DIAGNOSIS — R350 Frequency of micturition: Secondary | ICD-10-CM

## 2021-12-09 LAB — POCT URINALYSIS DIPSTICK
Appearance: NORMAL
Bilirubin, UA: NEGATIVE
Blood, UA: NEGATIVE
Glucose, UA: NEGATIVE
Ketones, UA: NEGATIVE
Leukocytes, UA: NEGATIVE
Nitrite, UA: NEGATIVE
Protein, UA: NEGATIVE
Spec Grav, UA: 1.01 (ref 1.010–1.025)
Urobilinogen, UA: 0.2 E.U./dL
pH, UA: 7 (ref 5.0–8.0)

## 2021-12-09 NOTE — Progress Notes (Signed)
Urogynecology Return Visit  SUBJECTIVE  History of Present Illness: Brendan Gruwell is a 71 y.o. female seen in follow-up.  Noticed a small spot of blood after intercourse. Noticed some sticky discharge. Has some pain on left side with sitting for longer periods. Also having issued with her bowels and constipation and has been seeing GI. Took miralax for a few days and symptoms improved. Also having some burning with urination.   s/p anterior repair and cystoscopy on 03/15/21  Past Medical History: Patient  has a past medical history of Chronic fatigue syndrome, Frequency of urination, Hemorrhoids, Hiatal hernia, History of gastroesophageal reflux (GERD), Hyperlipidemia, Osteoporosis, Prolapse of anterior vaginal wall, and Wears glasses.   Past Surgical History: She  has a past surgical history that includes Total abdominal hysterectomy (1991); Dilation and curettage of uterus; Esophagogastroduodenoscopy (10/2015); Colonoscopy (2015); Cystocele repair (N/A, 03/15/2021); and Cystoscopy (N/A, 03/15/2021).   Medications: She has a current medication list which includes the following prescription(s): calcium-vitamin d-vitamin k, vitamin d, estradiol, raloxifene, simvastatin, and vitamin b-12.   Allergies: Patient is allergic to alendronate sodium, amoxicillin, ivp dye [iodinated contrast media], and motrin [ibuprofen].   Social History: Patient  reports that she has never smoked. She has never used smokeless tobacco. She reports that she does not drink alcohol and does not use drugs.      OBJECTIVE     Physical Exam: Vitals:   12/09/21 1457  BP: (!) 147/81  Pulse: 66   Gen: No apparent distress, A&O x 3. Abdomen soft, nontender.   Detailed Urogynecologic Evaluation:  Normal external genitalia. Speculum exam revealed normal mucosa. Palpation on pelvic floor muscles shows tenderness in the obturator internus bilaterally and reproduces left sided pelvic pain.    POP-Q  -2                                            Aa   -2                                           Ba  -6                                              C   2.5                                            Gh  2                                            Pb  6.5                                            tvl   -3  Ap  -3                                            Bp                                                 D     POC urine: negative  ASSESSMENT AND PLAN    Ms. Russman is a 71 y.o. with:  1. Pelvic pain   2. Dysuria    Pelvic pain due to high tone pelvic floor -Treatment options include pelvic floor physical therapy, local (vaginal) or oral  muscle relaxants, pelvic muscle trigger point injections or centrally acting pain medications.   - She declines PT a this time. Was provided with some pelvic floor exercises on a handout. - She is sensitive to medications so would not want to take a muscle relaxer. Recommended ibuprofen '600mg'$  OTC q6hrs prn. - She has follow up with GI for bowel symptoms  Follow up as needed  [name], MD  Time spent: I spent 20 minutes dedicated to the care of this patient on the date of this encounter to include pre-visit review of records, face-to-face time with the patient and post visit documentation.

## 2021-12-10 ENCOUNTER — Encounter: Payer: Self-pay | Admitting: Obstetrics and Gynecology

## 2021-12-21 ENCOUNTER — Ambulatory Visit: Payer: Medicare HMO | Admitting: Obstetrics and Gynecology

## 2022-02-04 DIAGNOSIS — G8929 Other chronic pain: Secondary | ICD-10-CM | POA: Diagnosis not present

## 2022-02-04 DIAGNOSIS — R1032 Left lower quadrant pain: Secondary | ICD-10-CM | POA: Diagnosis not present

## 2022-02-04 DIAGNOSIS — R300 Dysuria: Secondary | ICD-10-CM | POA: Diagnosis not present

## 2022-02-04 DIAGNOSIS — R195 Other fecal abnormalities: Secondary | ICD-10-CM | POA: Diagnosis not present

## 2022-02-08 DIAGNOSIS — R195 Other fecal abnormalities: Secondary | ICD-10-CM | POA: Diagnosis not present

## 2022-02-08 DIAGNOSIS — R1032 Left lower quadrant pain: Secondary | ICD-10-CM | POA: Diagnosis not present

## 2022-02-08 DIAGNOSIS — K640 First degree hemorrhoids: Secondary | ICD-10-CM | POA: Diagnosis not present

## 2022-02-08 DIAGNOSIS — Q438 Other specified congenital malformations of intestine: Secondary | ICD-10-CM | POA: Diagnosis not present

## 2022-02-10 ENCOUNTER — Ambulatory Visit (INDEPENDENT_AMBULATORY_CARE_PROVIDER_SITE_OTHER): Payer: Medicare HMO | Admitting: Nurse Practitioner

## 2022-02-10 ENCOUNTER — Encounter: Payer: Self-pay | Admitting: Nurse Practitioner

## 2022-02-10 VITALS — BP 100/78 | HR 70 | Temp 97.6°F | Resp 10 | Ht 62.0 in | Wt 118.2 lb

## 2022-02-10 DIAGNOSIS — R300 Dysuria: Secondary | ICD-10-CM

## 2022-02-10 DIAGNOSIS — R310 Gross hematuria: Secondary | ICD-10-CM | POA: Insufficient documentation

## 2022-02-10 DIAGNOSIS — M545 Low back pain, unspecified: Secondary | ICD-10-CM | POA: Diagnosis not present

## 2022-02-10 LAB — POCT URINALYSIS DIPSTICK
Bilirubin, UA: NEGATIVE
Blood, UA: NEGATIVE
Glucose, UA: NEGATIVE
Ketones, UA: NEGATIVE
Leukocytes, UA: NEGATIVE
Nitrite, UA: NEGATIVE
Protein, UA: NEGATIVE
Spec Grav, UA: <=1.005 — AB (ref 1.010–1.025)
Urobilinogen, UA: 0.2 E.U./dL
pH, UA: 7 (ref 5.0–8.0)

## 2022-02-10 NOTE — Assessment & Plan Note (Signed)
Patient has back pain likely secondary to helping has been out in the yard.  Patient can use over-the-counter treatments and regimens for her back to see if this helps improve inclusive of Voltaren gel if needed.

## 2022-02-10 NOTE — Assessment & Plan Note (Signed)
Patient complained of dysuria did review UA from gastro office that was questionable for infection.  Repeated UA today that was negative.  Urinary culture

## 2022-02-10 NOTE — Progress Notes (Signed)
Acute Office Visit  Subjective:     Patient ID: Tracy [name], female    DOB: [birth year], 71 y.o.   MRN: [id number]  Chief Complaint  Patient presents with   Hematuria    Had some blood on the toilet paper and in the toilet water on 01/29/22, noticed urine was cloudy, some back pain, then went to see GI on 02/04/22 for other issues she was having and they did urine test and it was abnormal and they thought she may have UTI.      Patient is in today for  urinary symptoms  Noticed blood on the toliet paper and water. Noticed after she wiped. States that when she went to pee and noticed that it was cloudy. States that she started to drink water to try and flush it out. But it was still cloudy.  States that she had an appointment with Gastro on 02/04/2022 and gave a urine sample. States that they decided to do a colonsocpy. States that she started prep and had a colonoscpy this past Tuesday.  She inquired about the urine sample and they said that she may have a urinary tract infection.  Per patient report they did a culture the urine and did request that she follow-up with PCP  States that over the past two days she has been helping her husband out side. She has been bending over and picking up plastic   Review of Systems  Constitutional:  Positive for chills. Negative for fever.  Gastrointestinal:  Positive for abdominal pain and nausea. Negative for vomiting.  Genitourinary:  Positive for dysuria (intermittent), hematuria and urgency (can be baseline). Negative for frequency.  Musculoskeletal:  Positive for back pain.  Neurological:  Negative for tingling and weakness.        Objective:    BP 100/78   Pulse 70   Temp 97.6 F (36.4 C)   Resp 10   Ht '5\' 2"'$  (1.575 m)   Wt 118 lb 4 oz (53.6 kg)   SpO2 97%   BMI 21.63 kg/m    Physical Exam Vitals and nursing note reviewed.  Constitutional:      Appearance: Normal appearance.  Cardiovascular:     Rate and Rhythm: Normal  rate and regular rhythm.     Pulses: Normal pulses.     Heart sounds: Normal heart sounds.  Pulmonary:     Effort: Pulmonary effort is normal.     Breath sounds: Normal breath sounds.  Abdominal:     General: Bowel sounds are normal. There is no distension.     Palpations: There is no mass.     Tenderness: There is no abdominal tenderness. There is right CVA tenderness and left CVA tenderness.     Hernia: No hernia is present.  Musculoskeletal:        General: Tenderness present.       Arms:     Lumbar back: Tenderness present. No bony tenderness. Negative right straight leg raise test and negative left straight leg raise test.  Neurological:     General: No focal deficit present.     Mental Status: She is alert.     Deep Tendon Reflexes:     Reflex Scores:      Patellar reflexes are 2+ on the right side and 2+ on the left side.    Comments: Bilateral lower extremity strength 5/5     No results found for any visits on 02/10/22.      Assessment &  Plan:   Problem List Items Addressed This Visit       Genitourinary   Gross hematuria    Patient describes gross hematuria.  She has not had any as of late.  We will continue to monitor no blood on UA today.        Other   Dysuria - Primary    Patient complained of dysuria did review UA from gastro office that was questionable for infection.  Repeated UA today that was negative.  Urinary culture      Relevant Orders   POCT urinalysis dipstick   Urine Culture   Acute bilateral low back pain without sciatica    Patient has back pain likely secondary to helping has been out in the yard.  Patient can use over-the-counter treatments and regimens for her back to see if this helps improve inclusive of Voltaren gel if needed.       No orders of the defined types were placed in this encounter.   Return if symptoms worsen or fail to improve.  Romilda Garret, NP

## 2022-02-10 NOTE — Patient Instructions (Signed)
Nice to see you today The urine we got in office looked good We are going to culture it and be in touch once I have the results Follow up if symptoms return or do not improve

## 2022-02-10 NOTE — Assessment & Plan Note (Signed)
Patient describes gross hematuria.  She has not had any as of late.  We will continue to monitor no blood on UA today.

## 2022-02-11 LAB — URINE CULTURE
MICRO NUMBER:: 13673146
Result:: NO GROWTH
SPECIMEN QUALITY:: ADEQUATE

## 2022-03-16 ENCOUNTER — Telehealth: Payer: Self-pay | Admitting: Family Medicine

## 2022-03-16 NOTE — Telephone Encounter (Signed)
Patient called because her AWV visit got moved to 05/09/22 and it showed for in person but at the moment we dont have anyone in office for this. She would like a call back at [phone]

## 2022-03-17 NOTE — Telephone Encounter (Signed)
Tried to contact patient back but no answer.    She can do AWV by phone or can reschedule to later date if she would like to have in office.

## 2022-04-09 ENCOUNTER — Other Ambulatory Visit: Payer: Self-pay | Admitting: Family Medicine

## 2022-04-13 DIAGNOSIS — H524 Presbyopia: Secondary | ICD-10-CM | POA: Diagnosis not present

## 2022-05-02 ENCOUNTER — Telehealth: Payer: Self-pay | Admitting: Family Medicine

## 2022-05-02 DIAGNOSIS — E559 Vitamin D deficiency, unspecified: Secondary | ICD-10-CM

## 2022-05-02 DIAGNOSIS — M810 Age-related osteoporosis without current pathological fracture: Secondary | ICD-10-CM

## 2022-05-02 DIAGNOSIS — E538 Deficiency of other specified B group vitamins: Secondary | ICD-10-CM

## 2022-05-02 DIAGNOSIS — E7800 Pure hypercholesterolemia, unspecified: Secondary | ICD-10-CM

## 2022-05-02 DIAGNOSIS — Z0000 Encounter for general adult medical examination without abnormal findings: Secondary | ICD-10-CM

## 2022-05-02 NOTE — Telephone Encounter (Signed)
-----   Message from Ellamae Sia sent at 04/26/2022  4:13 PM EDT ----- Regarding: Lab orders for Wednesday, 10.11.23 Patient is scheduled for CPX labs, please order future labs, Thanks , Karna Christmas

## 2022-05-04 ENCOUNTER — Other Ambulatory Visit (INDEPENDENT_AMBULATORY_CARE_PROVIDER_SITE_OTHER): Payer: Medicare HMO

## 2022-05-04 DIAGNOSIS — E559 Vitamin D deficiency, unspecified: Secondary | ICD-10-CM | POA: Diagnosis not present

## 2022-05-04 DIAGNOSIS — M810 Age-related osteoporosis without current pathological fracture: Secondary | ICD-10-CM

## 2022-05-04 DIAGNOSIS — E538 Deficiency of other specified B group vitamins: Secondary | ICD-10-CM

## 2022-05-04 DIAGNOSIS — E7800 Pure hypercholesterolemia, unspecified: Secondary | ICD-10-CM | POA: Diagnosis not present

## 2022-05-04 DIAGNOSIS — Z0000 Encounter for general adult medical examination without abnormal findings: Secondary | ICD-10-CM | POA: Diagnosis not present

## 2022-05-04 LAB — CBC WITH DIFFERENTIAL/PLATELET
Basophils Absolute: 0 10*3/uL (ref 0.0–0.1)
Basophils Relative: 0.3 % (ref 0.0–3.0)
Eosinophils Absolute: 0.1 10*3/uL (ref 0.0–0.7)
Eosinophils Relative: 2 % (ref 0.0–5.0)
HCT: 41.1 % (ref 36.0–46.0)
Hemoglobin: 13.5 g/dL (ref 12.0–15.0)
Lymphocytes Relative: 44.3 % (ref 12.0–46.0)
Lymphs Abs: 2.4 10*3/uL (ref 0.7–4.0)
MCHC: 32.9 g/dL (ref 30.0–36.0)
MCV: 95.3 fl (ref 78.0–100.0)
Monocytes Absolute: 0.5 10*3/uL (ref 0.1–1.0)
Monocytes Relative: 9.1 % (ref 3.0–12.0)
Neutro Abs: 2.3 10*3/uL (ref 1.4–7.7)
Neutrophils Relative %: 44.3 % (ref 43.0–77.0)
Platelets: 174 10*3/uL (ref 150.0–400.0)
RBC: 4.31 Mil/uL (ref 3.87–5.11)
RDW: 14 % (ref 11.5–15.5)
WBC: 5.3 10*3/uL (ref 4.0–10.5)

## 2022-05-04 LAB — LIPID PANEL
Cholesterol: 178 mg/dL (ref 0–200)
HDL: 75.3 mg/dL (ref >39.00)
LDL Cholesterol: 89 mg/dL (ref 0–99)
NonHDL: 102.97
Total CHOL/HDL Ratio: 2
Triglycerides: 72 mg/dL (ref 0.0–149.0)
VLDL: 14.4 mg/dL (ref 0.0–40.0)

## 2022-05-04 LAB — COMPREHENSIVE METABOLIC PANEL
ALT: 16 U/L (ref 0–35)
AST: 21 U/L (ref 0–37)
Albumin: 3.9 g/dL (ref 3.5–5.2)
Alkaline Phosphatase: 35 U/L — ABNORMAL LOW (ref 39–117)
BUN: 10 mg/dL (ref 6–23)
CO2: 30 mEq/L (ref 19–32)
Calcium: 8.9 mg/dL (ref 8.4–10.5)
Chloride: 106 mEq/L (ref 96–112)
Creatinine, Ser: 0.62 mg/dL (ref 0.40–1.20)
GFR: 90.2 mL/min (ref >60.00)
Glucose, Bld: 83 mg/dL (ref 70–99)
Potassium: 4 mEq/L (ref 3.5–5.1)
Sodium: 143 mEq/L (ref 135–145)
Total Bilirubin: 0.6 mg/dL (ref 0.2–1.2)
Total Protein: 5.9 g/dL — ABNORMAL LOW (ref 6.0–8.3)

## 2022-05-04 LAB — VITAMIN D 25 HYDROXY (VIT D DEFICIENCY, FRACTURES): VITD: 48.22 ng/mL (ref 30.00–100.00)

## 2022-05-04 LAB — VITAMIN B12: Vitamin B-12: 576 pg/mL (ref 211–911)

## 2022-05-04 LAB — TSH: TSH: 2.19 u[IU]/mL (ref 0.35–5.50)

## 2022-05-09 ENCOUNTER — Ambulatory Visit (INDEPENDENT_AMBULATORY_CARE_PROVIDER_SITE_OTHER): Payer: Medicare HMO | Admitting: Family Medicine

## 2022-05-09 ENCOUNTER — Ambulatory Visit (INDEPENDENT_AMBULATORY_CARE_PROVIDER_SITE_OTHER): Payer: Medicare HMO | Admitting: *Deleted

## 2022-05-09 ENCOUNTER — Encounter: Payer: Self-pay | Admitting: Family Medicine

## 2022-05-09 VITALS — BP 98/62 | HR 68 | Temp 97.8°F | Ht 62.25 in | Wt 117.2 lb

## 2022-05-09 DIAGNOSIS — R102 Pelvic and perineal pain unspecified side: Secondary | ICD-10-CM

## 2022-05-09 DIAGNOSIS — Z0000 Encounter for general adult medical examination without abnormal findings: Secondary | ICD-10-CM | POA: Diagnosis not present

## 2022-05-09 DIAGNOSIS — Z1231 Encounter for screening mammogram for malignant neoplasm of breast: Secondary | ICD-10-CM

## 2022-05-09 DIAGNOSIS — Z1211 Encounter for screening for malignant neoplasm of colon: Secondary | ICD-10-CM

## 2022-05-09 DIAGNOSIS — Z780 Asymptomatic menopausal state: Secondary | ICD-10-CM | POA: Diagnosis not present

## 2022-05-09 DIAGNOSIS — M810 Age-related osteoporosis without current pathological fracture: Secondary | ICD-10-CM | POA: Diagnosis not present

## 2022-05-09 DIAGNOSIS — E7800 Pure hypercholesterolemia, unspecified: Secondary | ICD-10-CM | POA: Diagnosis not present

## 2022-05-09 DIAGNOSIS — E538 Deficiency of other specified B group vitamins: Secondary | ICD-10-CM | POA: Diagnosis not present

## 2022-05-09 DIAGNOSIS — E559 Vitamin D deficiency, unspecified: Secondary | ICD-10-CM | POA: Diagnosis not present

## 2022-05-09 DIAGNOSIS — R1032 Left lower quadrant pain: Secondary | ICD-10-CM

## 2022-05-09 NOTE — Assessment & Plan Note (Signed)
dexa is due and was ordered Pt will call to schedule that at [place] No falls or fx On evista (yr 2)  Ca and D Enc some strength training

## 2022-05-09 NOTE — Assessment & Plan Note (Signed)
Did PT It was helpful

## 2022-05-09 NOTE — Assessment & Plan Note (Signed)
D level 48.2 Vitamin D level is therapeutic with current supplementation Disc importance of this to bone and overall health

## 2022-05-09 NOTE — Assessment & Plan Note (Signed)
Had colonoscopy 01/2022  Per pt nl with hemorrhoids Sent for report [facility]

## 2022-05-09 NOTE — Assessment & Plan Note (Signed)
Disc goals for lipids and reasons to control them Rev last labs with pt Rev low sat fat diet in detail Well controlled with simvastatin 40 mg daily and diet

## 2022-05-09 NOTE — Progress Notes (Signed)
Subjective:   Tracy [name] is a 71 y.o. female who presents for Medicare Annual (Subsequent) preventive examination.  I connected with  Soraida Vickers on 05/09/22 by a telephone enabled telemedicine application and verified that I am speaking with the correct person using two identifiers.   I discussed the limitations of evaluation and management by telemedicine. The patient expressed understanding and agreed to proceed.  Patient location: home  Provider location: Tele-Health-home    Review of Systems     Cardiac Risk Factors include: advanced age (>50mn, >>44women);hypertension     Objective:    Today's Vitals   05/09/22 0834  PainSc: 3    There is no height or weight on file to calculate BMI.     05/09/2022    8:37 AM 06/17/2020   12:38 PM 03/24/2020   12:07 PM 03/18/2019    9:06 AM 03/08/2018    9:31 AM  Advanced Directives  Does Patient Have a Medical Advance Directive? No No No No No  Does patient want to make changes to medical advance directive?   Yes (MAU/Ambulatory/Procedural Areas - Information given)    Would patient like information on creating a medical advance directive? No - Patient declined No - Patient declined   No - Patient declined    Current Medications (verified) Outpatient Encounter Medications as of 05/09/2022  Medication Sig   Calcium-Vitamin D-Vitamin K 650-12.5-40 MG-MCG-MCG CHEW Chew 2 tablets by mouth 2 (two) times daily.   Cholecalciferol (VITAMIN D) 2000 UNITS CAPS Take 1 capsule by mouth daily.   estradiol (ESTRACE) 0.1 MG/GM vaginal cream Place 1 g vaginally 2 (two) times a week. Apply pea size to the vaginal area   raloxifene (EVISTA) 60 MG tablet TAKE 1 TABLET BY MOUTH EVERY DAY   simvastatin (ZOCOR) 40 MG tablet TAKE (1) TABLET BY MOUTH DAILY AT BEDTIME   vitamin B-12 (CYANOCOBALAMIN) 500 MCG tablet Take 500 mcg by mouth daily.   Wheat Dextrin (BENEFIBER PO) Take by mouth. 2 teaspoons daily   No facility-administered  encounter medications on file as of 05/09/2022.    Allergies (verified) Alendronate sodium, Amoxicillin, Ivp dye [iodinated contrast media], and Motrin [ibuprofen]   History: Past Medical History:  Diagnosis Date   Chronic fatigue syndrome    Frequency of urination    Hemorrhoids    Hiatal hernia    History of gastroesophageal reflux (GERD)    w/ hx gastritis   Hyperlipidemia    Osteoporosis    Prolapse of anterior vaginal wall    Wears glasses    Past Surgical History:  Procedure Laterality Date   COLONOSCOPY  2015   CYSTOCELE REPAIR N/A 03/15/2021   Procedure: ANTERIOR REPAIR (CYSTOCELE);  Surgeon: SJaquita Folds MD;  [facility]  Service: Gynecology;  Laterality: N/A;  total time requested 1.5hrs   CYSTOSCOPY N/A 03/15/2021   Procedure: CYSTOSCOPY;  Surgeon: SJaquita Folds MD;  [facility]  Service: Gynecology;  Laterality: N/A;   DILATION AND CURETTAGE OF UTERUS     yrs ago per pt for missed ab   ESOPHAGOGASTRODUODENOSCOPY  10/2015   TOTAL ABDOMINAL HYSTERECTOMY  1991   per pt w/ USO,  unsure which side   Family History  Problem Relation Age of Onset   Stroke Mother    Hypertension Father    Stroke Father    Hyperlipidemia Sister    Hyperlipidemia Brother    Cancer Paternal Uncle    Cancer Maternal Grandmother  intestinal CA   Cancer Paternal Grandfather        CA unsure which kind   Hyperlipidemia Brother    Heart attack Sister        Thought to be silent MI   Breast cancer Neg Hx    Social History   Socioeconomic History   Marital status: Married    Spouse name: Not on file   Number of children: Not on file   Years of education: Not on file   Highest education level: Not on file  Occupational History   Not on file  Tobacco Use   Smoking status: Never   Smokeless tobacco: Never  Vaping Use   Vaping Use: Never used  Substance and Sexual Activity   Alcohol use: No     Alcohol/week: 0.0 standard drinks of alcohol   Drug use: Never   Sexual activity: Yes    Partners: Female  Other Topics Concern   Not on file  Social History Narrative   Not on file   Social Determinants of Health   Financial Resource Strain: Low Risk  (05/09/2022)   Overall Financial Resource Strain (CARDIA)    Difficulty of Paying Living Expenses: Not hard at all  Food Insecurity: No Food Insecurity (05/09/2022)   Hunger Vital Sign    Worried About Running Out of Food in the Last Year: Never true    [place] in the Last Year: Never true  Transportation Needs: No Transportation Needs (05/09/2022)   PRAPARE - Hydrologist (Medical): No    Lack of Transportation (Non-Medical): No  Physical Activity: Unknown (05/09/2022)   Exercise Vital Sign    Days of Exercise per Week: Not on file    Minutes of Exercise per Session: 0 min  Stress: No Stress Concern Present (05/09/2022)   [place]    Feeling of Stress : Not at all  Social Connections: [employer] (05/09/2022)   Social Connection and Isolation Panel [NHANES]    Frequency of Communication with Friends and Family: More than three times a week    Frequency of Social Gatherings with Friends and Family: Once a week    Attends Religious Services: More than 4 times per year    Active Member of [employer] or Organizations: Yes    Attends Music therapist: More than 4 times per year    Marital Status: Married    Tobacco Counseling Counseling given: Not Answered   Clinical Intake:  Pre-visit preparation completed: Yes  Pain : 0-10 Pain Score: 3  Pain Location:  (side pain) Pain Orientation: Left Pain Descriptors / Indicators: Aching, Burning, Discomfort Pain Onset: 1 to 4 weeks ago Pain Frequency: Intermittent     Diabetes: No  How often do you need to have someone help you when you read instructions,  pamphlets, or other written materials from your doctor or pharmacy?: 1 - Never  Diabetic?   No  Interpreter Needed?: No  Information entered by :: Leroy Kennedy LPN   Activities of Daily Living    05/09/2022    8:47 AM  In your present state of health, do you have any difficulty performing the following activities:  Hearing? 0  Vision? 0  Difficulty concentrating or making decisions? 0  Walking or climbing stairs? 0  Dressing or bathing? 0  Doing errands, shopping? 0  Preparing Food and eating ? N  Using the Toilet? N  In the  past six months, have you accidently leaked urine? N  Do you have problems with loss of bowel control? N  Managing your Medications? N  Managing your Finances? N  Housekeeping or managing your Housekeeping? N    Patient Care Team: Tower, Wynelle Fanny, MD as PCP - General  Indicate any recent Medical Services you may have received from other than [facility] providers in the past year (date may be approximate).     Assessment:   This is a routine wellness examination for [place].  Hearing/Vision screen Hearing Screening - Comments:: No trouble hearing  Vision Screening - Comments:: [place] eye appointment [facility]  Up to date  Dietary issues and exercise activities discussed: Current Exercise Habits: The patient does not participate in regular exercise at present   Goals Addressed             This Visit's Progress    Increase physical activity       Walk more     Patient Stated   On track    Starting 03/08/2018, I will continue to take medications as prescribed.      Patient Stated   Not on track    03/18/2019, wants to get in to better shape     Patient Stated   On track    03/24/2020, I will maintain and continue medications as prescribed.        Depression Screen    05/09/2022    8:46 AM 05/06/2021    8:10 AM 04/01/2020   10:36 AM 03/24/2020   12:19 PM 03/18/2019    9:07 AM 03/08/2018    9:31 AM 03/03/2017   10:16  AM  PHQ 2/9 Scores  PHQ - 2 Score 0 0 0 0 0 0 0  PHQ- 9 Score 1  0 0 0 0 4    Fall Risk    05/09/2022    8:38 AM 05/06/2021    8:10 AM 04/01/2020   10:35 AM 03/24/2020   12:19 PM 03/18/2019    9:07 AM  [place] in the past year? 0 0 1 1 0  Comment    tripped down stairs   Number falls in past yr: 0 0 1 0   Injury with Fall? 0 0 0 0   Risk for fall due to :    No Fall Risks   Follow up Falls evaluation completed;Education provided;Falls prevention discussed   Falls evaluation completed;Falls prevention discussed Falls evaluation completed;Falls prevention discussed    FALL RISK PREVENTION PERTAINING TO THE HOME:  Any stairs in or around the home? Yes  If so, are there any without handrails? No  Home free of loose throw rugs in walkways, pet beds, electrical cords, etc? Yes  Adequate lighting in your home to reduce risk of falls? Yes   ASSISTIVE DEVICES UTILIZED TO PREVENT FALLS:  Life alert? No  Use of a cane, walker or w/c? No  Grab bars in the bathroom? No  Shower chair or bench in shower? No  Elevated toilet seat or a handicapped toilet? Yes   TIMED UP AND GO:  Was the test performed? No .    Cognitive Function:    03/24/2020   12:24 PM 03/18/2019    9:10 AM 03/08/2018    9:31 AM  MMSE - Mini Mental State Exam  Orientation to time '5 5 5  '$ Orientation to Place '5 5 5  '$ Registration '3 3 3  '$ Attention/ Calculation 5 5  0  Recall '3 3 3  '$ Language- name 2 objects  0 0  Language- repeat 1 0 1  Language- follow 3 step command  0 3  Language- read & follow direction  0 0  Write a sentence  0 0  Copy design  0 0  Total score  21 20        05/09/2022    8:39 AM  6CIT Screen  What Year? 0 points  What month? 0 points  What time? 0 points  Count back from 20 0 points  Months in reverse 0 points  Repeat phrase 0 points  Total Score 0 points    Immunizations Immunization History  Administered Date(s) Administered   Fluad Quad(high Dose 65+) 03/25/2019,  04/01/2020   Influenza, High Dose Seasonal PF 03/30/2017   Influenza,inj,Quad PF,6+ Mos 09/08/2016, 04/26/2018   Influenza-Unspecified 04/24/2013, 05/05/2014, 05/04/2015   PFIZER(Purple Top)SARS-COV-2 Vaccination 10/08/2019, 10/29/2019   Pneumococcal Conjugate-13 03/03/2017   Pneumococcal Polysaccharide-23 03/08/2018   Td 07/25/1996, 01/09/2007, 03/30/2017   Zoster, Live 02/14/2012    TDAP status: Up to date  Flu Vaccine status: Declined, Education has been provided regarding the importance of this vaccine but patient still declined. Advised may receive this vaccine at local pharmacy or Health Dept. Aware to provide a copy of the vaccination record if obtained from local pharmacy or Health Dept. Verbalized acceptance and understanding.  Pneumococcal vaccine status: Up to date  Covid-19 vaccine status: Declined, Education has been provided regarding the importance of this vaccine but patient still declined. Advised may receive this vaccine at local pharmacy or Health Dept.or vaccine clinic. Aware to provide a copy of the vaccination record if obtained from local pharmacy or Health Dept. Verbalized acceptance and understanding.  Qualifies for Shingles Vaccine? Yes   Zostavax completed Yes   Shingrix Completed?: No.    Education has been provided regarding the importance of this vaccine. Patient has been advised to call insurance company to determine out of pocket expense if they have not yet received this vaccine. Advised may also receive vaccine at local pharmacy or Health Dept. Verbalized acceptance and understanding.  Screening Tests Health Maintenance  Topic Date Due   COVID-19 Vaccine (3 - Pfizer series) 05/25/2022 (Originally 12/24/2019)   Zoster Vaccines- Shingrix (1 of 2) 08/09/2022 (Originally 11/16/2001)   INFLUENZA VACCINE  10/23/2022 (Originally 02/22/2022)   MAMMOGRAM  05/18/2022   COLONOSCOPY (Pts 45-76yr Insurance coverage will need to be confirmed)  04/11/2024   TETANUS/TDAP   03/31/2027   Pneumonia Vaccine 70 Years old  Completed   DEXA SCAN  Completed   Hepatitis C Screening  Completed   HPV VACCINES  Aged Out    Health Maintenance  There are no preventive care reminders to display for this patient.   Colorectal cancer screening: Type of screening: Colonoscopy. Completed 2023. Repeat every 0 years  Mammogram status: Ordered  . Pt provided with contact info and advised to call to schedule appt.   Bone Density status: Ordered  . Pt provided with contact info and advised to call to schedule appt.  Lung Cancer Screening: (Low Dose CT Chest recommended if Age 70-80years, 30 pack-year currently smoking OR have quit w/in 15years.) does not qualify.   Lung Cancer Screening Referral:   Additional Screening:  Hepatitis C Screening: does not qualify; Completed 2022  Vision Screening: Recommended annual ophthalmology exams for early detection of glaucoma and other disorders of the eye. Is the patient up to date with their annual eye exam?  Yes  Who is the provider or what is the name of the office in which the patient attends annual eye exams? [facility] If pt is not established with a provider, would they like to be referred to a provider to establish care? No .   Dental Screening: Recommended annual dental exams for proper oral hygiene  Community Resource Referral / Chronic Care Management: CRR required this visit?  No   CCM required this visit?  No      Plan:     I have personally reviewed and noted the following in the patient's chart:   Medical and social history Use of alcohol, tobacco or illicit drugs  Current medications and supplements including opioid prescriptions. Patient is not currently taking opioid prescriptions. Functional ability and status Nutritional status Physical activity Advanced directives List of other physicians Hospitalizations, surgeries, and ER visits in previous 12 months Vitals Screenings to include cognitive,  depression, and falls Referrals and appointments  In addition, I have reviewed and discussed with patient certain preventive protocols, quality metrics, and best practice recommendations. A written personalized care plan for preventive services as well as general preventive health recommendations were provided to patient.     Leroy Kennedy, LPN   [id number]   Nurse Notes:

## 2022-05-09 NOTE — Assessment & Plan Note (Signed)
Lab Results  Component Value Date   [id number] 05/04/2022   Continues oral 500 mcg daily

## 2022-05-09 NOTE — Assessment & Plan Note (Signed)
Reviewed health habits including diet and exercise and skin cancer prevention Reviewed appropriate screening tests for age  Also reviewed health mt list, fam hx and immunization status , as well as social and family history   See HPI Labs reviewed  Declines imms (due to CFS) Mammogram and dexa ordered, she will call to schedule No falls/fx  Disc strength training and more protein intake  utd derm care

## 2022-05-09 NOTE — Assessment & Plan Note (Signed)
Ongoing and intermittent  Seeing GI at [facility] Had colonoscopy 01/2022 - nl  Has f/u soon

## 2022-05-09 NOTE — Patient Instructions (Addendum)
The orders are in for [facility] for mammogram and bone density test  Give them a call and schedule those   Try holding evista for 4-5 days If cramps go away then that may be the cause  A spoon of mustard is helpful for cramps   Think about doing some strength training  Light weights or bands are helpful  Chair yoga is a good program   Stop at check out and we will send for your colonoscopy report   Keep working on more protein in your diet   Follow up with GI as planned

## 2022-05-09 NOTE — Patient Instructions (Signed)
Tracy [name] , Thank you for taking time to come for your Medicare Wellness Visit. I appreciate your ongoing commitment to your health goals. Please review the following plan we discussed and let me know if I can assist you in the future.   These are the goals we discussed:  Goals      Increase physical activity     Walk more     Patient Stated     Starting 03/08/2018, I will continue to take medications as prescribed.      Patient Stated     03/18/2019, wants to get in to better shape     Patient Stated     03/24/2020, I will maintain and continue medications as prescribed.         This is a list of the screening recommended for you and due dates:  Health Maintenance  Topic Date Due   COVID-19 Vaccine (3 - Pfizer series) 05/25/2022*   Zoster (Shingles) Vaccine (1 of 2) 08/09/2022*   Flu Shot  10/23/2022*   Mammogram  05/18/2022   Colon Cancer Screening  04/11/2024   Tetanus Vaccine  03/31/2027   Pneumonia Vaccine  Completed   DEXA scan (bone density measurement)  Completed   Hepatitis C Screening: USPSTF Recommendation to screen - Ages 55-79 yo.  Completed   HPV Vaccine  Aged Out  *Topic was postponed. The date shown is not the original due date.    Advanced directives: Education provided  Conditions/risks identified:      Preventive Care 28 Years and Older, Female Preventive care refers to lifestyle choices and visits with your health care provider that can promote health and wellness. What does preventive care include? A yearly physical exam. This is also called an annual well check. Dental exams once or twice a year. Routine eye exams. Ask your health care provider how often you should have your eyes checked. Personal lifestyle choices, including: Daily care of your teeth and gums. Regular physical activity. Eating a healthy diet. Avoiding tobacco and drug use. Limiting alcohol use. Practicing safe sex. Taking low-dose aspirin every day. Taking vitamin and  mineral supplements as recommended by your health care provider. What happens during an annual well check? The services and screenings done by your health care provider during your annual well check will depend on your age, overall health, lifestyle risk factors, and family history of disease. Counseling  Your health care provider may ask you questions about your: Alcohol use. Tobacco use. Drug use. Emotional well-being. Home and relationship well-being. Sexual activity. Eating habits. History of falls. Memory and ability to understand (cognition). Work and work Statistician. Reproductive health. Screening  You may have the following tests or measurements: Height, weight, and BMI. Blood pressure. Lipid and cholesterol levels. These may be checked every 5 years, or more frequently if you are over 2 years old. Skin check. Lung cancer screening. You may have this screening every year starting at age 62 if you have a 30-pack-year history of smoking and currently smoke or have quit within the past 15 years. Fecal occult blood test (FOBT) of the stool. You may have this test every year starting at age 81. Flexible sigmoidoscopy or colonoscopy. You may have a sigmoidoscopy every 5 years or a colonoscopy every 10 years starting at age 17. Hepatitis C blood test. Hepatitis B blood test. Sexually transmitted disease (STD) testing. Diabetes screening. This is done by checking your blood sugar (glucose) after you have not eaten for a while (fasting). You  may have this done every 1-3 years. Bone density scan. This is done to screen for osteoporosis. You may have this done starting at age 86. Mammogram. This may be done every 1-2 years. Talk to your health care provider about how often you should have regular mammograms. Talk with your health care provider about your test results, treatment options, and if necessary, the need for more tests. Vaccines  Your health care provider may recommend  certain vaccines, such as: Influenza vaccine. This is recommended every year. Tetanus, diphtheria, and acellular pertussis (Tdap, Td) vaccine. You may need a Td booster every 10 years. Zoster vaccine. You may need this after age 37. Pneumococcal 13-valent conjugate (PCV13) vaccine. One dose is recommended after age 93. Pneumococcal polysaccharide (PPSV23) vaccine. One dose is recommended after age 49. Talk to your health care provider about which screenings and vaccines you need and how often you need them. This information is not intended to replace advice given to you by your health care provider. Make sure you discuss any questions you have with your health care provider. Document Released: 08/07/2015 Document Revised: 03/30/2016 Document Reviewed: 05/12/2015 Elsevier Interactive Patient Education  2017 [place] Prevention in the Home Falls can cause injuries. They can happen to people of all ages. There are many things you can do to make your home safe and to help prevent falls. What can I do on the outside of my home? Regularly fix the edges of walkways and driveways and fix any cracks. Remove anything that might make you trip as you walk through a door, such as a raised step or threshold. Trim any bushes or trees on the path to your home. Use bright outdoor lighting. Clear any walking paths of anything that might make someone trip, such as rocks or tools. Regularly check to see if handrails are loose or broken. Make sure that both sides of any steps have handrails. Any raised decks and porches should have guardrails on the edges. Have any leaves, snow, or ice cleared regularly. Use sand or salt on walking paths during winter. Clean up any spills in your garage right away. This includes oil or grease spills. What can I do in the bathroom? Use night lights. Install grab bars by the toilet and in the tub and shower. Do not use towel bars as grab bars. Use non-skid mats or  decals in the tub or shower. If you need to sit down in the shower, use a plastic, non-slip stool. Keep the floor dry. Clean up any water that spills on the floor as soon as it happens. Remove soap buildup in the tub or shower regularly. Attach bath mats securely with double-sided non-slip rug tape. Do not have throw rugs and other things on the floor that can make you trip. What can I do in the bedroom? Use night lights. Make sure that you have a light by your bed that is easy to reach. Do not use any sheets or blankets that are too big for your bed. They should not hang down onto the floor. Have a firm chair that has side arms. You can use this for support while you get dressed. Do not have throw rugs and other things on the floor that can make you trip. What can I do in the kitchen? Clean up any spills right away. Avoid walking on wet floors. Keep items that you use a lot in easy-to-reach places. If you need to reach something above you, use a strong  step stool that has a grab bar. Keep electrical cords out of the way. Do not use floor polish or wax that makes floors slippery. If you must use wax, use non-skid floor wax. Do not have throw rugs and other things on the floor that can make you trip. What can I do with my stairs? Do not leave any items on the stairs. Make sure that there are handrails on both sides of the stairs and use them. Fix handrails that are broken or loose. Make sure that handrails are as long as the stairways. Check any carpeting to make sure that it is firmly attached to the stairs. Fix any carpet that is loose or worn. Avoid having throw rugs at the top or bottom of the stairs. If you do have throw rugs, attach them to the floor with carpet tape. Make sure that you have a light switch at the top of the stairs and the bottom of the stairs. If you do not have them, ask someone to add them for you. What else can I do to help prevent falls? Wear shoes that: Do not  have high heels. Have rubber bottoms. Are comfortable and fit you well. Are closed at the toe. Do not wear sandals. If you use a stepladder: Make sure that it is fully opened. Do not climb a closed stepladder. Make sure that both sides of the stepladder are locked into place. Ask someone to hold it for you, if possible. Clearly mark and make sure that you can see: Any grab bars or handrails. First and last steps. Where the edge of each step is. Use tools that help you move around (mobility aids) if they are needed. These include: Canes. Walkers. Scooters. Crutches. Turn on the lights when you go into a dark area. Replace any light bulbs as soon as they burn out. Set up your furniture so you have a clear path. Avoid moving your furniture around. If any of your floors are uneven, fix them. If there are any pets around you, be aware of where they are. Review your medicines with your doctor. Some medicines can make you feel dizzy. This can increase your chance of falling. Ask your doctor what other things that you can do to help prevent falls. This information is not intended to replace advice given to you by your health care provider. Make sure you discuss any questions you have with your health care provider. Document Released: 05/07/2009 Document Revised: 12/17/2015 Document Reviewed: 08/15/2014 Elsevier Interactive Patient Education  2017 [employer].

## 2022-05-09 NOTE — Progress Notes (Signed)
Subjective:    Patient ID: Tracy [name], female    DOB: 10/16/1950, 71 y.o.   MRN: [id number]  HPI Here for health maintenance exam and to review chronic medical problems    Wt Readings from Last 3 Encounters:  05/09/22 117 lb 4 oz (53.2 kg)  02/10/22 118 lb 4 oz (53.6 kg)  11/03/21 116 lb 2 oz (52.7 kg)   21.27 kg/m  Feeling ok overall   Still has pain in her side that comes and goes Has not been able to figure it out Eases off after a bm  Had colonoscpy - has int hemorrhoids and nothing else - now takes 2 tsp of benefiber daily (this helps bms)  Saw GI at [facility]   Less episodes of chronic fatigue    Immunization History  Administered Date(s) Administered   Fluad Quad(high Dose 65+) 03/25/2019, 04/01/2020   Influenza, High Dose Seasonal PF 03/30/2017   Influenza,inj,Quad PF,6+ Mos 09/08/2016, 04/26/2018   Influenza-Unspecified 04/24/2013, 05/05/2014, 05/04/2015   PFIZER(Purple Top)SARS-COV-2 Vaccination 10/08/2019, 10/29/2019   Pneumococcal Conjugate-13 03/03/2017   Pneumococcal Polysaccharide-23 03/08/2018   Td 07/25/1996, 01/09/2007, 03/30/2017   Zoster, Live 02/14/2012   There are no preventive care reminders to display for this patient.  Declines flu shot  Declines shingrix   Mammogram 04/2021 Order is in  Self breast exam: no lumps   Colonoscopy 03/2014 Had colonoscopy 01/2022   Dexa  04/2020  osteoporosis  On evista after a drug holiday (since 2021) , she has a lot of cramps (? Side eff)  Due and order is in  Falls: none Fractures: none  Supplements ca and D D level is nl at 48.2  Exercise : needs to do more  Some walking in house and pelvic exercises    BP Readings from Last 3 Encounters:  05/09/22 98/62  02/10/22 100/78  12/09/21 (!) 147/81   Pulse Readings from Last 3 Encounters:  05/09/22 68  02/10/22 70  12/09/21 66   Lab Results  Component Value Date   CREATININE 0.62 05/04/2022   BUN 10 05/04/2022   NA 143 05/04/2022   K  4.0 05/04/2022   CL 106 05/04/2022   CO2 30 05/04/2022     Hyperlipidemia Lab Results  Component Value Date   CHOL 178 05/04/2022   CHOL 174 03/26/2021   CHOL 179 03/24/2020   Lab Results  Component Value Date   HDL 75.30 05/04/2022   HDL 66.80 03/26/2021   HDL 71.30 03/24/2020   Lab Results  Component Value Date   LDLCALC 89 05/04/2022   LDLCALC 88 03/26/2021   LDLCALC 89 03/24/2020   Lab Results  Component Value Date   TRIG 72.0 05/04/2022   TRIG 97.0 03/26/2021   TRIG 96.0 03/24/2020   Lab Results  Component Value Date   CHOLHDL 2 05/04/2022   CHOLHDL 3 03/26/2021   CHOLHDL 3 03/24/2020   Lab Results  Component Value Date   LDLDIRECT 172.6 05/20/2013   Simvastatin 40 mg daily  Good control    B12 def Lab Results  Component Value Date   VITAMINB12 576 05/04/2022   Takes 500 mcg daily    Lab Results  Component Value Date   WBC 5.3 05/04/2022   HGB 13.5 05/04/2022   HCT 41.1 05/04/2022   MCV 95.3 05/04/2022   PLT 174.0 05/04/2022   Cataract surgery is upcoming   Patient Active Problem List   Diagnosis Date Noted   Acute bilateral low back pain without sciatica  02/10/2022   Abdominal pain, left lower quadrant 11/03/2021   Tingling of both feet 11/03/2021   Vaginal discharge 11/03/2021   Hard stool 11/03/2021   Medicare annual wellness visit, subsequent 05/06/2021   Vaginal irritation 04/15/2020   Pelvic pain 04/15/2020   Angiomyolipoma of left kidney 03/11/2020   Welcome to Medicare preventive visit 03/03/2017   Estrogen deficiency 03/03/2017   Borderline low blood pressure determined by examination 10/31/2016   Vitamin B12 deficiency 10/31/2016   Chest pain with moderate risk for cardiac etiology 10/17/2014   Colon cancer screening 02/17/2014   Encounter for screening mammogram for breast cancer 02/07/2011   Routine general medical examination at a health care facility 02/01/2011   Vitamin D deficiency 04/14/2008   Osteoporosis  01/09/2007   Hyperlipidemia 01/04/2007   Atrophic vaginitis 01/04/2007   SCOLIOSIS 01/04/2007   CHRONIC FATIGUE SYNDROME 01/04/2007   Past Medical History:  Diagnosis Date   Chronic fatigue syndrome    Frequency of urination    Hemorrhoids    Hiatal hernia    History of gastroesophageal reflux (GERD)    w/ hx gastritis   Hyperlipidemia    Osteoporosis    Prolapse of anterior vaginal wall    Wears glasses    Past Surgical History:  Procedure Laterality Date   COLONOSCOPY  2015   CYSTOCELE REPAIR N/A 03/15/2021   Procedure: ANTERIOR REPAIR (CYSTOCELE);  Surgeon: [name], MD;  [facility];  Service: Gynecology;  Laterality: N/A;  total time requested 1.5hrs   CYSTOSCOPY N/A 03/15/2021   Procedure: CYSTOSCOPY;  Surgeon: [name], MD;  Location: [facility];  Service: Gynecology;  Laterality: N/A;   DILATION AND CURETTAGE OF UTERUS     yrs ago per pt for missed ab   ESOPHAGOGASTRODUODENOSCOPY  10/2015   TOTAL ABDOMINAL HYSTERECTOMY  1991   per pt w/ USO,  unsure which side   Social History   Tobacco Use   Smoking status: Never   Smokeless tobacco: Never  Vaping Use   Vaping Use: Never used  Substance Use Topics   Alcohol use: No    Alcohol/week: 0.0 standard drinks of alcohol   Drug use: Never   Family History  Problem Relation Age of Onset   Stroke Mother    Hypertension Father    Stroke Father    Hyperlipidemia Sister    Hyperlipidemia Brother    Cancer Paternal Uncle    Cancer Maternal Grandmother        intestinal CA   Cancer Paternal Grandfather        CA unsure which kind   Hyperlipidemia Brother    Heart attack Sister        Thought to be silent MI   Breast cancer Neg Hx    Allergies  Allergen Reactions   Alendronate Sodium     REACTION: GI issues   Amoxicillin Nausea And Vomiting   Ivp Dye [Iodinated Contrast Media] Rash   Motrin [Ibuprofen] Rash    PER PT ONLY MOTRIN BRAND, THIS  HAPPENED MANY YRS AGO.  CAN TAKE ADVIL BRAND ONLY    Current Outpatient Medications on File Prior to Visit  Medication Sig Dispense Refill   Calcium-Vitamin D-Vitamin K 650-12.5-40 MG-MCG-MCG CHEW Chew 2 tablets by mouth 2 (two) times daily.     Cholecalciferol (VITAMIN D) 2000 UNITS CAPS Take 1 capsule by mouth daily.     estradiol (ESTRACE) 0.1 MG/GM vaginal cream Place 1 g vaginally 2 (two) times  a week. Apply pea size to the vaginal area     raloxifene (EVISTA) 60 MG tablet TAKE 1 TABLET BY MOUTH EVERY DAY 90 tablet 0   simvastatin (ZOCOR) 40 MG tablet TAKE (1) TABLET BY MOUTH DAILY AT BEDTIME 90 tablet 0   vitamin B-12 (CYANOCOBALAMIN) 500 MCG tablet Take 500 mcg by mouth daily.     Wheat Dextrin (BENEFIBER PO) Take by mouth. 2 teaspoons daily     No current facility-administered medications on file prior to visit.    Review of Systems  Constitutional:  Negative for activity change, appetite change, fatigue, fever and unexpected weight change.  HENT:  Negative for congestion, ear pain, rhinorrhea, sinus pressure and sore throat.   Eyes:  Negative for pain, redness and visual disturbance.  Respiratory:  Negative for cough, shortness of breath and wheezing.   Cardiovascular:  Negative for chest pain and palpitations.  Gastrointestinal:  Positive for abdominal pain. Negative for abdominal distention, anal bleeding, blood in stool, constipation and diarrhea.  Endocrine: Negative for polydipsia and polyuria.  Genitourinary:  Negative for dysuria, frequency and urgency.  Musculoskeletal:  Positive for arthralgias. Negative for back pain and myalgias.  Skin:  Negative for pallor and rash.  Allergic/Immunologic: Negative for environmental allergies.  Neurological:  Negative for dizziness, syncope and headaches.  Hematological:  Negative for adenopathy. Does not bruise/bleed easily.  Psychiatric/Behavioral:  Negative for decreased concentration and dysphoric mood. The patient is not  nervous/anxious.        Objective:   Physical Exam Constitutional:      General: She is not in acute distress.    Appearance: Normal appearance. She is well-developed and normal weight. She is not ill-appearing or diaphoretic.  HENT:     Head: Normocephalic and atraumatic.     Right Ear: Tympanic membrane, ear canal and external ear normal.     Left Ear: Tympanic membrane, ear canal and external ear normal.     Nose: Nose normal. No congestion.     Mouth/Throat:     Mouth: Mucous membranes are moist.     Pharynx: Oropharynx is clear. No posterior oropharyngeal erythema.  Eyes:     General: No scleral icterus.    Extraocular Movements: Extraocular movements intact.     Conjunctiva/sclera: Conjunctivae normal.     Pupils: Pupils are equal, round, and reactive to light.  Neck:     Thyroid: No thyromegaly.     Vascular: No carotid bruit or JVD.  Cardiovascular:     Rate and Rhythm: Normal rate and regular rhythm.     Pulses: Normal pulses.     Heart sounds: Normal heart sounds.     No gallop.  Pulmonary:     Effort: Pulmonary effort is normal. No respiratory distress.     Breath sounds: Normal breath sounds. No wheezing.     Comments: Good air exch Chest:     Chest wall: No tenderness.  Abdominal:     General: Bowel sounds are normal. There is no distension or abdominal bruit.     Palpations: Abdomen is soft. There is no mass.     Tenderness: There is no abdominal tenderness.     Hernia: No hernia is present.     Comments: No tenderness LLQ of abd bothers her after sitting up   Genitourinary:    Comments: Breast exam: No mass, nodules, thickening, tenderness, bulging, retraction, inflamation, nipple discharge or skin changes noted.  No axillary or clavicular LA.     Musculoskeletal:  General: No tenderness. Normal range of motion.     Cervical back: Normal range of motion and neck supple. No rigidity. No muscular tenderness.     Right lower leg: No edema.     Left  lower leg: No edema.     Comments: Mild  kyphosis   Lymphadenopathy:     Cervical: No cervical adenopathy.  Skin:    General: Skin is warm and dry.     Coloration: Skin is not pale.     Findings: No erythema or rash.     Comments: Solar lentigines diffusely Some scattered sks   Neurological:     Mental Status: She is alert. Mental status is at baseline.     Cranial Nerves: No cranial nerve deficit.     Motor: No abnormal muscle tone.     Coordination: Coordination normal.     Gait: Gait normal.     Deep Tendon Reflexes: Reflexes are normal and symmetric. Reflexes normal.  Psychiatric:        Mood and Affect: Mood normal.        Cognition and Memory: Cognition and memory normal.     Comments: Pleasant  Talkative            Assessment & Plan:   Problem List Items Addressed This Visit       Musculoskeletal and Integument   Osteoporosis    dexa is due and was ordered Pt will call to schedule that at [place] No falls or fx On evista (yr 2)  Ca and D Enc some strength training         Other   Abdominal pain, left lower quadrant    Ongoing and intermittent  Seeing GI at [facility] Had colonoscopy 01/2022 - nl  Has f/u soon      Colon cancer screening    Had colonoscopy 01/2022  Per pt nl with hemorrhoids Sent for report [facility]      Hyperlipidemia    Disc goals for lipids and reasons to control them Rev last labs with pt Rev low sat fat diet in detail Well controlled with simvastatin 40 mg daily and diet       Pelvic pain    Did PT It was helpful      Routine general medical examination at a health care facility - Primary    Reviewed health habits including diet and exercise and skin cancer prevention Reviewed appropriate screening tests for age  Also reviewed health mt list, fam hx and immunization status , as well as social and family history   See HPI Labs reviewed  Declines imms (due to CFS) Mammogram and dexa ordered, she will call to schedule No  falls/fx  Disc strength training and more protein intake  utd derm care        Vitamin B12 deficiency    Lab Results  Component Value Date   VITAMINB12 576 05/04/2022  Continues oral 500 mcg daily       Vitamin D deficiency    D level 48.2 Vitamin D level is therapeutic with current supplementation Disc importance of this to bone and overall health

## 2022-05-11 DIAGNOSIS — H25811 Combined forms of age-related cataract, right eye: Secondary | ICD-10-CM | POA: Diagnosis not present

## 2022-05-11 DIAGNOSIS — H43813 Vitreous degeneration, bilateral: Secondary | ICD-10-CM | POA: Diagnosis not present

## 2022-05-12 ENCOUNTER — Other Ambulatory Visit: Payer: Self-pay | Admitting: Family Medicine

## 2022-05-12 DIAGNOSIS — Z1231 Encounter for screening mammogram for malignant neoplasm of breast: Secondary | ICD-10-CM

## 2022-05-16 DIAGNOSIS — K5909 Other constipation: Secondary | ICD-10-CM | POA: Diagnosis not present

## 2022-05-16 DIAGNOSIS — G8929 Other chronic pain: Secondary | ICD-10-CM | POA: Diagnosis not present

## 2022-05-16 DIAGNOSIS — R1032 Left lower quadrant pain: Secondary | ICD-10-CM | POA: Diagnosis not present

## 2022-05-16 DIAGNOSIS — Q438 Other specified congenital malformations of intestine: Secondary | ICD-10-CM | POA: Diagnosis not present

## 2022-05-20 DIAGNOSIS — H25811 Combined forms of age-related cataract, right eye: Secondary | ICD-10-CM | POA: Diagnosis not present

## 2022-05-20 DIAGNOSIS — H269 Unspecified cataract: Secondary | ICD-10-CM | POA: Diagnosis not present

## 2022-05-20 DIAGNOSIS — H2511 Age-related nuclear cataract, right eye: Secondary | ICD-10-CM | POA: Diagnosis not present

## 2022-06-01 ENCOUNTER — Ambulatory Visit
Admission: RE | Admit: 2022-06-01 | Discharge: 2022-06-01 | Disposition: A | Discharge status: Home / Self Care | Payer: Medicare HMO | Source: Ambulatory Visit | Attending: Family Medicine | Admitting: Family Medicine

## 2022-06-01 DIAGNOSIS — Z780 Asymptomatic menopausal state: Secondary | ICD-10-CM | POA: Diagnosis not present

## 2022-06-01 DIAGNOSIS — M810 Age-related osteoporosis without current pathological fracture: Secondary | ICD-10-CM | POA: Diagnosis not present

## 2022-06-03 DIAGNOSIS — H269 Unspecified cataract: Secondary | ICD-10-CM | POA: Diagnosis not present

## 2022-06-03 DIAGNOSIS — H25812 Combined forms of age-related cataract, left eye: Secondary | ICD-10-CM | POA: Diagnosis not present

## 2022-06-09 DIAGNOSIS — H2512 Age-related nuclear cataract, left eye: Secondary | ICD-10-CM | POA: Diagnosis not present

## 2022-06-20 ENCOUNTER — Ambulatory Visit
Admission: RE | Admit: 2022-06-20 | Discharge: 2022-06-20 | Disposition: A | Discharge status: Home / Self Care | Payer: Medicare HMO | Source: Ambulatory Visit | Attending: Family Medicine | Admitting: Family Medicine

## 2022-06-20 DIAGNOSIS — Z1231 Encounter for screening mammogram for malignant neoplasm of breast: Secondary | ICD-10-CM | POA: Insufficient documentation

## 2022-06-30 ENCOUNTER — Other Ambulatory Visit: Payer: Self-pay | Admitting: Family Medicine

## 2022-07-04 ENCOUNTER — Other Ambulatory Visit: Payer: Self-pay | Admitting: Family Medicine

## 2022-07-20 ENCOUNTER — Other Ambulatory Visit: Payer: Medicare HMO

## 2022-08-17 DIAGNOSIS — H26492 Other secondary cataract, left eye: Secondary | ICD-10-CM | POA: Diagnosis not present

## 2022-08-22 DIAGNOSIS — H26492 Other secondary cataract, left eye: Secondary | ICD-10-CM | POA: Diagnosis not present

## 2022-09-06 DIAGNOSIS — X32XXXA Exposure to sunlight, initial encounter: Secondary | ICD-10-CM | POA: Diagnosis not present

## 2022-09-06 DIAGNOSIS — D2261 Melanocytic nevi of right upper limb, including shoulder: Secondary | ICD-10-CM | POA: Diagnosis not present

## 2022-09-06 DIAGNOSIS — D2272 Melanocytic nevi of left lower limb, including hip: Secondary | ICD-10-CM | POA: Diagnosis not present

## 2022-09-06 DIAGNOSIS — L570 Actinic keratosis: Secondary | ICD-10-CM | POA: Diagnosis not present

## 2022-09-06 DIAGNOSIS — L578 Other skin changes due to chronic exposure to nonionizing radiation: Secondary | ICD-10-CM | POA: Diagnosis not present

## 2022-09-06 DIAGNOSIS — D2271 Melanocytic nevi of right lower limb, including hip: Secondary | ICD-10-CM | POA: Diagnosis not present

## 2022-09-06 DIAGNOSIS — D225 Melanocytic nevi of trunk: Secondary | ICD-10-CM | POA: Diagnosis not present

## 2022-09-06 DIAGNOSIS — L821 Other seborrheic keratosis: Secondary | ICD-10-CM | POA: Diagnosis not present

## 2022-09-06 DIAGNOSIS — D2262 Melanocytic nevi of left upper limb, including shoulder: Secondary | ICD-10-CM | POA: Diagnosis not present

## 2023-01-23 ENCOUNTER — Other Ambulatory Visit: Payer: Self-pay | Admitting: Family Medicine

## 2023-04-16 IMAGING — MG MM DIGITAL SCREENING BILAT W/ TOMO AND CAD
8 series · 8 of 24 positions shown · non-contrast
Comparison: Previous exam(s).

CLINICAL DATA: Screening.

EXAM:
DIGITAL SCREENING BILATERAL MAMMOGRAM WITH TOMOSYNTHESIS AND CAD
TECHNIQUE: Bilateral screening digital craniocaudal and mediolateral oblique
mammograms were obtained. Bilateral screening digital breast
tomosynthesis was performed. The images were evaluated with
computer-aided detection.

[L CC synth-2D]
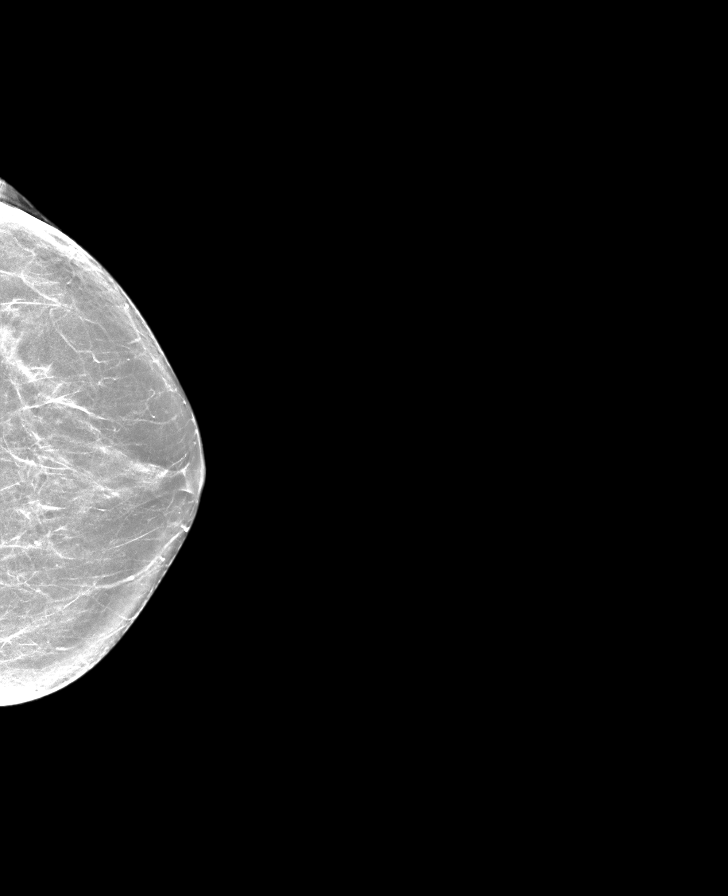

[R MLO synth-2D]
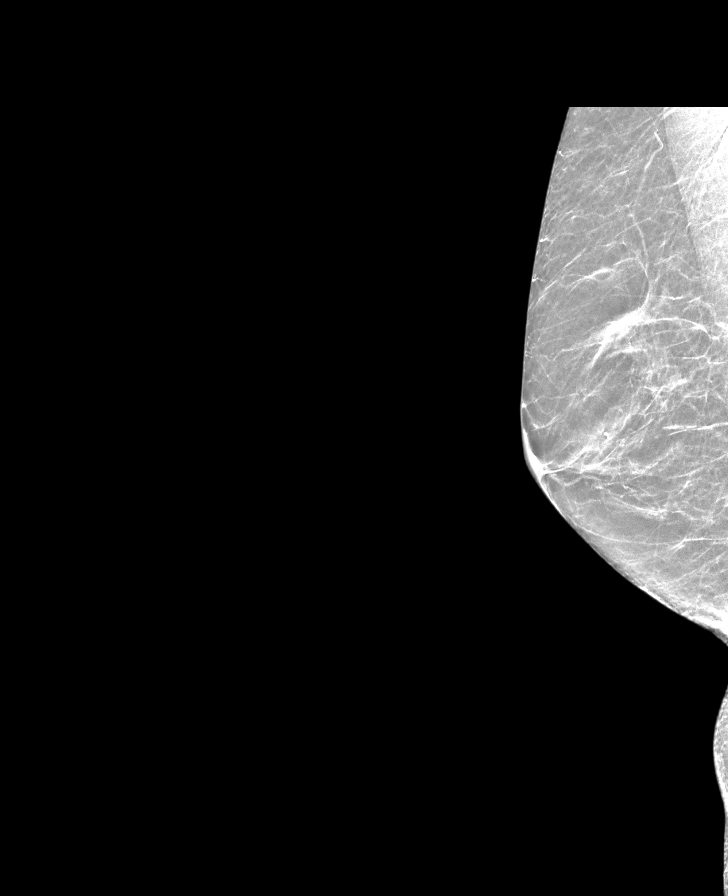

[L MLO synth-2D]
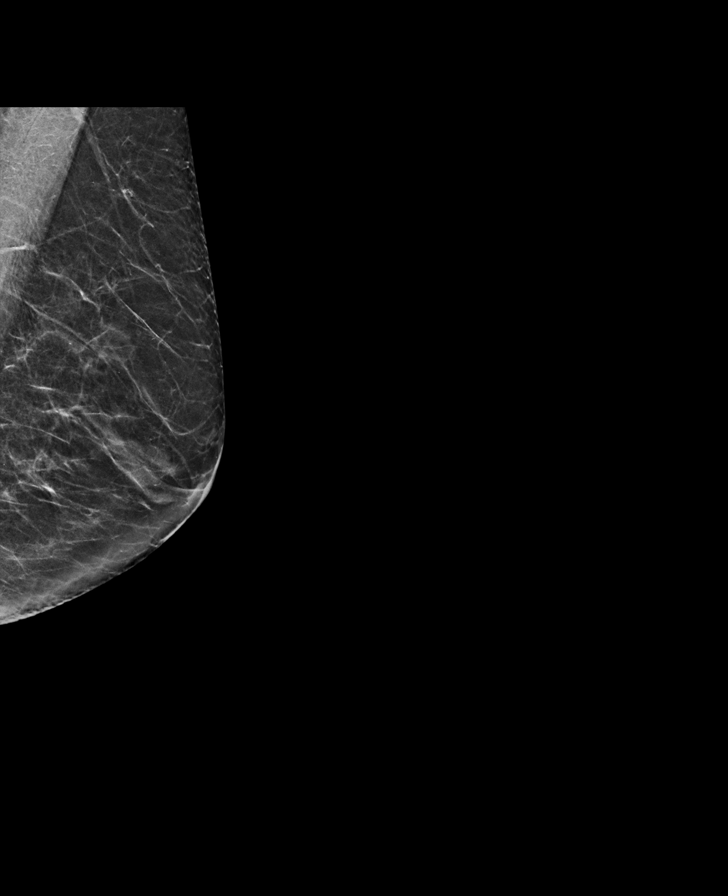

[R CC synth-2D]
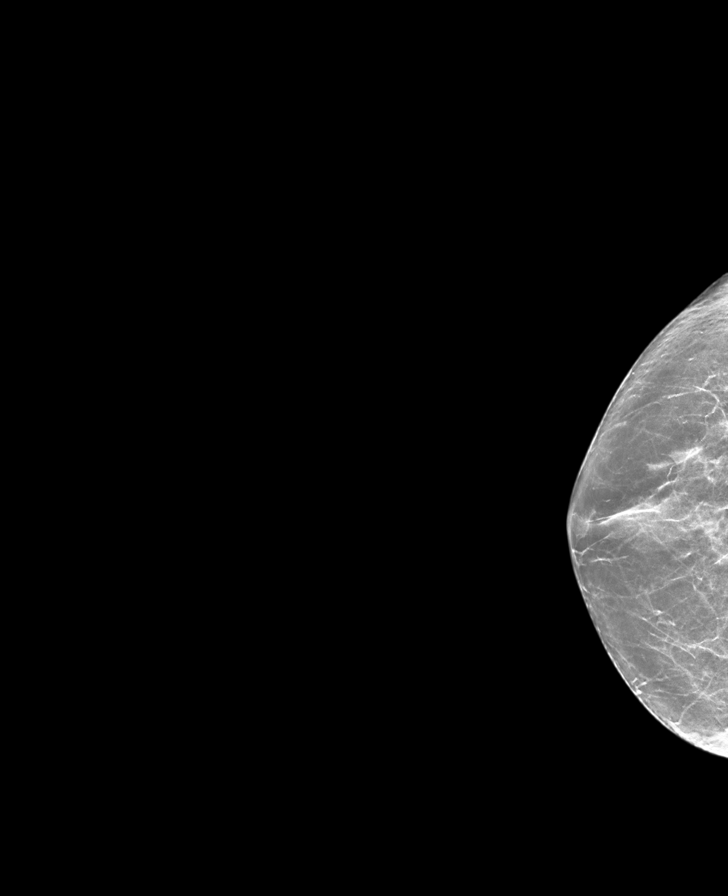

[R MLO tomo · tomo slice 26/51.0]
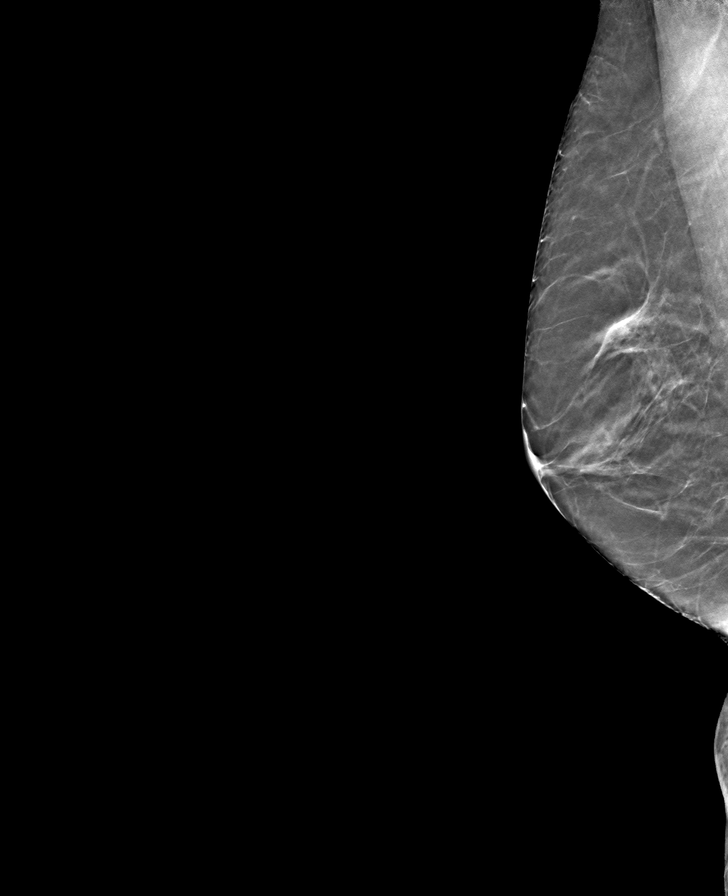

[L MLO tomo · tomo slice 29/57.0]
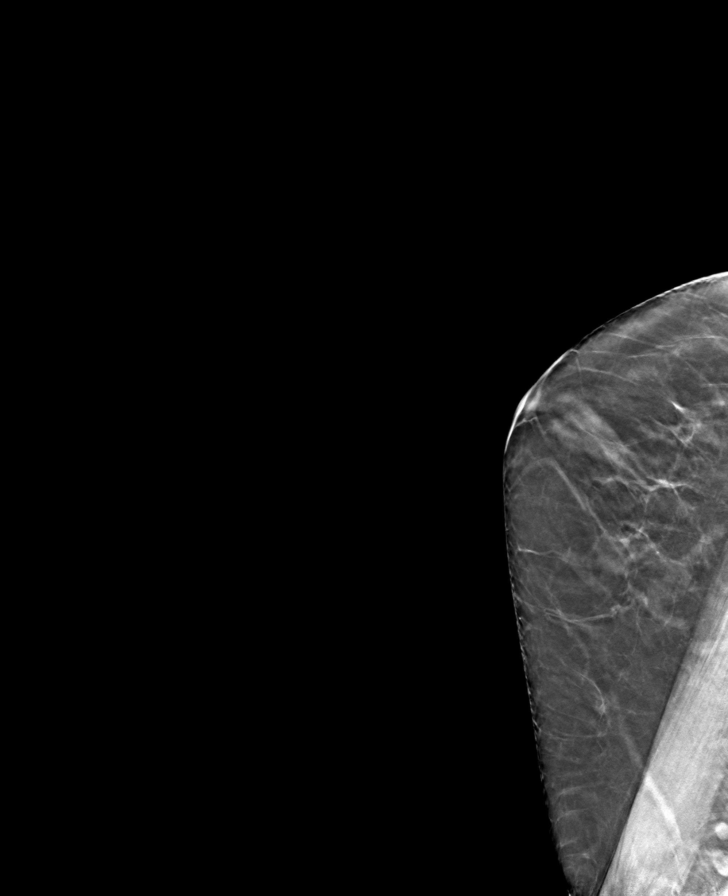

[R CC tomo · tomo slice 31/60.0]
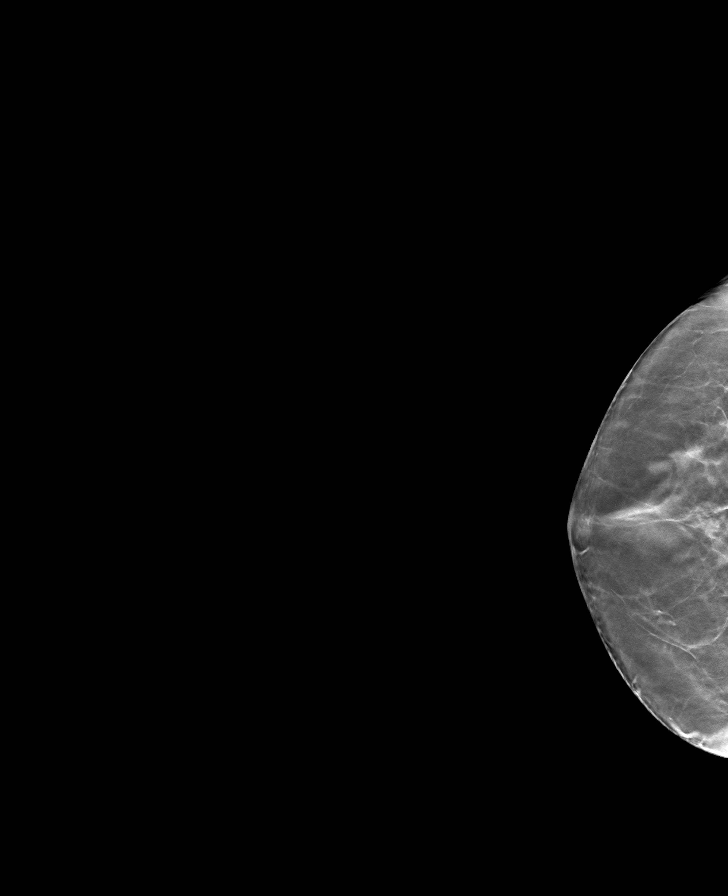

[L CC tomo · tomo slice 32/63.0]
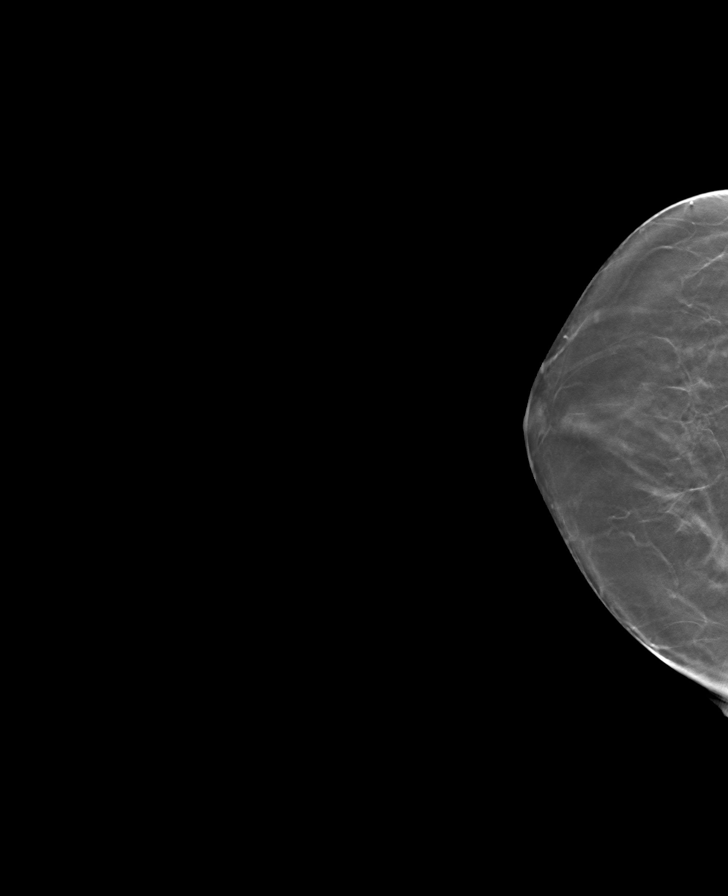

[8 of 24 positions shown; findings below may reference images not displayed]

ACR Breast Density Category b: There are scattered areas of
fibroglandular density.
FINDINGS: There are no findings suspicious for malignancy.
IMPRESSION: No mammographic evidence of malignancy. A result letter of this
screening mammogram will be mailed directly to the patient.

RECOMMENDATION:
Screening mammogram in one year. (Code:51-O-LD2)

BI-RADS CATEGORY  1: Negative.

## 2023-04-17 DIAGNOSIS — H524 Presbyopia: Secondary | ICD-10-CM | POA: Diagnosis not present

## 2023-04-21 ENCOUNTER — Other Ambulatory Visit: Payer: Self-pay | Admitting: Family Medicine

## 2023-05-04 ENCOUNTER — Telehealth: Payer: Self-pay | Admitting: Family Medicine

## 2023-05-04 DIAGNOSIS — E538 Deficiency of other specified B group vitamins: Secondary | ICD-10-CM

## 2023-05-04 DIAGNOSIS — E7800 Pure hypercholesterolemia, unspecified: Secondary | ICD-10-CM

## 2023-05-04 DIAGNOSIS — M810 Age-related osteoporosis without current pathological fracture: Secondary | ICD-10-CM

## 2023-05-04 DIAGNOSIS — G9332 Myalgic encephalomyelitis/chronic fatigue syndrome: Secondary | ICD-10-CM

## 2023-05-04 DIAGNOSIS — E559 Vitamin D deficiency, unspecified: Secondary | ICD-10-CM

## 2023-05-04 NOTE — Telephone Encounter (Signed)
-----   Message from Alvina Chou sent at 04/18/2023  4:03 PM EDT ----- Regarding: Lab orders for [facility], 10.11.24 Patient is scheduled for CPX labs, please order future labs, Thanks , Camelia Eng

## 2023-05-05 ENCOUNTER — Other Ambulatory Visit (INDEPENDENT_AMBULATORY_CARE_PROVIDER_SITE_OTHER): Payer: Medicare HMO

## 2023-05-05 DIAGNOSIS — E559 Vitamin D deficiency, unspecified: Secondary | ICD-10-CM | POA: Diagnosis not present

## 2023-05-05 DIAGNOSIS — E7800 Pure hypercholesterolemia, unspecified: Secondary | ICD-10-CM | POA: Diagnosis not present

## 2023-05-05 DIAGNOSIS — G9332 Myalgic encephalomyelitis/chronic fatigue syndrome: Secondary | ICD-10-CM | POA: Diagnosis not present

## 2023-05-05 DIAGNOSIS — E538 Deficiency of other specified B group vitamins: Secondary | ICD-10-CM

## 2023-05-05 LAB — COMPREHENSIVE METABOLIC PANEL
ALT: 14 U/L (ref 0–35)
AST: 21 U/L (ref 0–37)
Albumin: 3.9 g/dL (ref 3.5–5.2)
Alkaline Phosphatase: 36 U/L — ABNORMAL LOW (ref 39–117)
BUN: 10 mg/dL (ref 6–23)
CO2: 30 meq/L (ref 19–32)
Calcium: 9.2 mg/dL (ref 8.4–10.5)
Chloride: 106 meq/L (ref 96–112)
Creatinine, Ser: 0.61 mg/dL (ref 0.40–1.20)
GFR: 89.92 mL/min (ref >60.00)
Glucose, Bld: 82 mg/dL (ref 70–99)
Potassium: 4.4 meq/L (ref 3.5–5.1)
Sodium: 143 meq/L (ref 135–145)
Total Bilirubin: 0.6 mg/dL (ref 0.2–1.2)
Total Protein: 5.5 g/dL — ABNORMAL LOW (ref 6.0–8.3)

## 2023-05-05 LAB — LIPID PANEL
Cholesterol: 172 mg/dL (ref 0–200)
HDL: 68.2 mg/dL (ref >39.00)
LDL Cholesterol: 86 mg/dL (ref 0–99)
NonHDL: 103.74
Total CHOL/HDL Ratio: 3
Triglycerides: 88 mg/dL (ref 0.0–149.0)
VLDL: 17.6 mg/dL (ref 0.0–40.0)

## 2023-05-05 LAB — VITAMIN D 25 HYDROXY (VIT D DEFICIENCY, FRACTURES): VITD: 44.65 ng/mL (ref 30.00–100.00)

## 2023-05-05 LAB — CBC WITH DIFFERENTIAL/PLATELET
Basophils Absolute: 0.1 10*3/uL (ref 0.0–0.1)
Basophils Relative: 1.1 % (ref 0.0–3.0)
Eosinophils Absolute: 0.1 10*3/uL (ref 0.0–0.7)
Eosinophils Relative: 1.3 % (ref 0.0–5.0)
HCT: 42.7 % (ref 36.0–46.0)
Hemoglobin: 13.7 g/dL (ref 12.0–15.0)
Lymphocytes Relative: 46 % (ref 12.0–46.0)
Lymphs Abs: 2.7 10*3/uL (ref 0.7–4.0)
MCHC: 32 g/dL (ref 30.0–36.0)
MCV: 96.9 fL (ref 78.0–100.0)
Monocytes Absolute: 0.5 10*3/uL (ref 0.1–1.0)
Monocytes Relative: 8.1 % (ref 3.0–12.0)
Neutro Abs: 2.5 10*3/uL (ref 1.4–7.7)
Neutrophils Relative %: 43.5 % (ref 43.0–77.0)
Platelets: 188 10*3/uL (ref 150.0–400.0)
RBC: 4.4 Mil/uL (ref 3.87–5.11)
RDW: 14.2 % (ref 11.5–15.5)
WBC: 5.8 10*3/uL (ref 4.0–10.5)

## 2023-05-05 LAB — VITAMIN B12: Vitamin B-12: 428 pg/mL (ref 211–911)

## 2023-05-05 LAB — TSH: TSH: 1.85 u[IU]/mL (ref 0.35–5.50)

## 2023-05-11 ENCOUNTER — Ambulatory Visit: Payer: Medicare HMO

## 2023-05-11 VITALS — Ht 62.0 in | Wt 114.0 lb

## 2023-05-11 DIAGNOSIS — Z0000 Encounter for general adult medical examination without abnormal findings: Secondary | ICD-10-CM

## 2023-05-11 DIAGNOSIS — Z1231 Encounter for screening mammogram for malignant neoplasm of breast: Secondary | ICD-10-CM

## 2023-05-11 NOTE — Progress Notes (Signed)
Subjective:   Tracy [name] is a 72 y.o. female who presents for Medicare Annual (Subsequent) preventive examination.  Visit Complete: Virtual I connected with  Fara Olden on 05/11/23 by a audio enabled telemedicine application and verified that I am speaking with the correct person using two identifiers.  Patient Location: Home  Provider Location: Home Office  I discussed the limitations of evaluation and management by telemedicine. The patient expressed understanding and agreed to proceed.  Vital Signs: Because this visit was a virtual/telehealth visit, some criteria may be missing or patient reported. Any vitals not documented were not able to be obtained and vitals that have been documented are patient reported.  Patient Medicare AWV questionnaire was completed by the patient on 05/11/2023; I have confirmed that all information answered by patient is correct and no changes since this date.  Cardiac Risk Factors include: advanced age (>67men, >23 women);dyslipidemia     Objective:    Today's Vitals   05/11/23 1335  Weight: 114 lb (51.7 kg)  Height: 5\' 2"  (1.575 m)   Body mass index is 20.85 kg/m.     05/11/2023    1:38 PM 05/09/2022    8:37 AM 06/17/2020   12:38 PM 03/24/2020   12:07 PM 03/18/2019    9:06 AM 03/08/2018    9:31 AM  Advanced Directives  Does Patient Have a Medical Advance Directive? Yes No No No No No  Type of Estate agent of [place];Living will       Does patient want to make changes to medical advance directive?    Yes (MAU/Ambulatory/Procedural Areas - Information given)    Copy of Healthcare Power of Attorney in Chart? No - copy requested       Would patient like information on creating a medical advance directive?  No - Patient declined No - Patient declined   No - Patient declined    Current Medications (verified) Outpatient Encounter Medications as of 05/11/2023  Medication Sig   Calcium-Vitamin D-Vitamin K  650-12.5-40 MG-MCG-MCG CHEW Chew 2 tablets by mouth 2 (two) times daily.   Cholecalciferol (VITAMIN D) 2000 UNITS CAPS Take 1 capsule by mouth daily.   estradiol (ESTRACE) 0.1 MG/GM vaginal cream Place 1 g vaginally 2 (two) times a week. Apply pea size to the vaginal area   raloxifene (EVISTA) 60 MG tablet TAKE 1 TABLET BY MOUTH EVERY DAY   simvastatin (ZOCOR) 40 MG tablet TAKE (1) TABLET BY MOUTH DAILY AT BEDTIME   vitamin B-12 (CYANOCOBALAMIN) 500 MCG tablet Take 500 mcg by mouth daily.   Wheat Dextrin (BENEFIBER PO) Take by mouth. 2 teaspoons daily   No facility-administered encounter medications on file as of 05/11/2023.    Allergies (verified) Alendronate sodium, Amoxicillin, Ivp dye [iodinated contrast media], and Motrin [ibuprofen]   History: Past Medical History:  Diagnosis Date   Chronic fatigue syndrome    Frequency of urination    Hemorrhoids    Hiatal hernia    History of gastroesophageal reflux (GERD)    w/ hx gastritis   Hyperlipidemia    Osteoporosis    Prolapse of anterior vaginal wall    Wears glasses    Past Surgical History:  Procedure Laterality Date   COLONOSCOPY  2015   CYSTOCELE REPAIR N/A 03/15/2021   Procedure: ANTERIOR REPAIR (CYSTOCELE);  Surgeon: Marguerita Beards, MD;  Location: [facility];  Service: Gynecology;  Laterality: N/A;  total time requested 1.5hrs   CYSTOSCOPY N/A 03/15/2021   Procedure:  CYSTOSCOPY;  Surgeon: Marguerita Beards, MD;  Location: [facility];  Service: Gynecology;  Laterality: N/A;   DILATION AND CURETTAGE OF UTERUS     yrs ago per pt for missed ab   ESOPHAGOGASTRODUODENOSCOPY  10/2015   TOTAL ABDOMINAL HYSTERECTOMY  1991   per pt w/ USO,  unsure which side   Family History  Problem Relation Age of Onset   Stroke Mother    Hypertension Father    Stroke Father    Hyperlipidemia Sister    Hyperlipidemia Brother    Cancer Paternal Uncle    Cancer Maternal Grandmother         intestinal CA   Cancer Paternal Grandfather        CA unsure which kind   Hyperlipidemia Brother    Heart attack Sister        Thought to be silent MI   Breast cancer Neg Hx    Social History   Socioeconomic History   Marital status: Married    Spouse name: Not on file   Number of children: Not on file   Years of education: Not on file   Highest education level: Not on file  Occupational History   Not on file  Tobacco Use   Smoking status: Never   Smokeless tobacco: Never  Vaping Use   Vaping status: Never Used  Substance and Sexual Activity   Alcohol use: No    Alcohol/week: 0.0 standard drinks of alcohol   Drug use: Never   Sexual activity: Yes    Partners: Female  Other Topics Concern   Not on file  Social History Narrative   Not on file   Social Determinants of Health   Financial Resource Strain: Low Risk  (05/11/2023)   Overall Financial Resource Strain (CARDIA)    Difficulty of Paying Living Expenses: Not hard at all  Food Insecurity: No Food Insecurity (05/11/2023)   Hunger Vital Sign    Worried About Running Out of Food in the Last Year: Never true    Ran Out of Food in the Last Year: Never true  Transportation Needs: No Transportation Needs (05/11/2023)   PRAPARE - Administrator, Civil Service (Medical): No    Lack of Transportation (Non-Medical): No  Physical Activity: Insufficiently Active (05/11/2023)   Exercise Vital Sign    Days of Exercise per Week: 3 days    Minutes of Exercise per Session: 30 min  Stress: No Stress Concern Present (05/11/2023)   [employer] of Occupational Health - Occupational Stress Questionnaire    Feeling of Stress : Not at all  Social Connections: Moderately Integrated (05/11/2023)   Social Connection and Isolation Panel [NHANES]    Frequency of Communication with Friends and Family: More than three times a week    Frequency of Social Gatherings with Friends and Family: More than three times a week     Attends Religious Services: More than 4 times per year    Active Member of [employer] or Organizations: No    Attends Engineer, structural: Never    Marital Status: Married    Tobacco Counseling Counseling given: Not Answered   Clinical Intake:  Pre-visit preparation completed: Yes  Pain : No/denies pain     Nutritional Risks: None Diabetes: No  How often do you need to have someone help you when you read instructions, pamphlets, or other written materials from your doctor or pharmacy?: 1 - Never  Interpreter Needed?: No  Information entered  by :: Renie Ora, LPN   Activities of Daily Living    05/11/2023    1:38 PM  In your present state of health, do you have any difficulty performing the following activities:  Hearing? 0  Vision? 0  Difficulty concentrating or making decisions? 0  Walking or climbing stairs? 0  Dressing or bathing? 0  Doing errands, shopping? 0  Preparing Food and eating ? N  Using the Toilet? N  In the past six months, have you accidently leaked urine? N  Do you have problems with loss of bowel control? N  Managing your Medications? N  Managing your Finances? N  Housekeeping or managing your Housekeeping? N    Patient Care Team: Tower, Audrie Gallus, MD as PCP - General  Indicate any recent Medical Services you may have received from other than [facility] providers in the past year (date may be approximate).     Assessment:   This is a routine wellness examination for Kenise.  Hearing/Vision screen Vision Screening - Comments:: Wears rx glasses - up to date with routine eye exams with  Dr.Jackson    Goals Addressed             This Visit's Progress    Increase physical activity   On track    Walk more       Depression Screen    05/11/2023    1:37 PM 05/09/2022    8:46 AM 05/06/2021    8:10 AM 04/01/2020   10:36 AM 03/24/2020   12:19 PM 03/18/2019    9:07 AM 03/08/2018    9:31 AM  PHQ 2/9 Scores  PHQ - 2 Score 0 0 0 0 0 0 0   PHQ- 9 Score  1  0 0 0 0    Fall Risk    05/11/2023    1:35 PM 05/09/2022    8:38 AM 05/06/2021    8:10 AM 04/01/2020   10:35 AM 03/24/2020   12:19 PM  Fall Risk   Falls in the past year? 0 0 0 1 1  Comment     tripped down stairs  Number falls in past yr: 0 0 0 1 0  Injury with Fall? 0 0 0 0 0  Risk for fall due to : No Fall Risks    No Fall Risks  Follow up Falls prevention discussed Falls evaluation completed;Education provided;Falls prevention discussed   Falls evaluation completed;Falls prevention discussed    MEDICARE RISK AT HOME: Medicare Risk at Home Any stairs in or around the home?: Yes If so, are there any without handrails?: No Home free of loose throw rugs in walkways, pet beds, electrical cords, etc?: Yes Adequate lighting in your home to reduce risk of falls?: Yes Life alert?: No Use of a cane, walker or w/c?: No Grab bars in the bathroom?: Yes Shower chair or bench in shower?: Yes Elevated toilet seat or a handicapped toilet?: Yes  TIMED UP AND GO:  Was the test performed?  No    Cognitive Function:    03/24/2020   12:24 PM 03/18/2019    9:10 AM 03/08/2018    9:31 AM  MMSE - Mini Mental State Exam  Orientation to time 5 5 5   Orientation to Place 5 5 5   Registration 3 3 3   Attention/ Calculation 5 5 0  Recall 3 3 3   Language- name 2 objects  0 0  Language- repeat 1 0 1  Language- follow 3 step command  0 3  Language- read & follow direction  0 0  Write a sentence  0 0  Copy design  0 0  Total score  21 20        05/11/2023    1:39 PM 05/09/2022    8:39 AM  6CIT Screen  What Year? 0 points 0 points  What month? 0 points 0 points  What time? 0 points 0 points  Count back from 20 0 points 0 points  Months in reverse 0 points 0 points  Repeat phrase 0 points 0 points  Total Score 0 points 0 points    Immunizations Immunization History  Administered Date(s) Administered   Fluad Quad(high Dose 65+) 03/25/2019, 04/01/2020   Influenza,  High Dose Seasonal PF 03/30/2017   Influenza,inj,Quad PF,6+ Mos 09/08/2016, 04/26/2018   Influenza-Unspecified 04/24/2013, 05/05/2014, 05/04/2015   PFIZER(Purple Top)SARS-COV-2 Vaccination 10/08/2019, 10/29/2019   Pneumococcal Conjugate-13 03/03/2017   Pneumococcal Polysaccharide-23 03/08/2018   Td 07/25/1996, 01/09/2007, 03/30/2017   Zoster, Live 02/14/2012    TDAP status: Up to date  Flu Vaccine status: Declined, Education has been provided regarding the importance of this vaccine but patient still declined. Advised may receive this vaccine at local pharmacy or Health Dept. Aware to provide a copy of the vaccination record if obtained from local pharmacy or Health Dept. Verbalized acceptance and understanding.  Pneumococcal vaccine status: Up to date  Covid-19 vaccine status: Completed vaccines  Qualifies for Shingles Vaccine? Yes   Zostavax completed No   Shingrix Completed?: No.    Education has been provided regarding the importance of this vaccine. Patient has been advised to call insurance company to determine out of pocket expense if they have not yet received this vaccine. Advised may also receive vaccine at local pharmacy or Health Dept. Verbalized acceptance and understanding.  Screening Tests Health Maintenance  Topic Date Due   Zoster Vaccines- Shingrix (1 of 2) 11/16/2001   INFLUENZA VACCINE  02/23/2023   COVID-19 Vaccine (3 - 2023-24 season) 03/26/2023   MAMMOGRAM  06/21/2023   Medicare Annual Wellness (AWV)  05/10/2024   DTaP/Tdap/Td (4 - Tdap) 03/31/2027   Colonoscopy  02/09/2032   Pneumonia Vaccine 82+ Years old  Completed   DEXA SCAN  Completed   Hepatitis C Screening  Completed   HPV VACCINES  Aged Out    Health Maintenance  Health Maintenance Due  Topic Date Due   Zoster Vaccines- Shingrix (1 of 2) 11/16/2001   INFLUENZA VACCINE  02/23/2023   COVID-19 Vaccine (3 - 2023-24 season) 03/26/2023    Colorectal cancer screening: Type of screening:  Colonoscopy. Completed 02/08/2022. Repeat every 10 years  Mammogram status: Completed 06/20/2022. Repeat every year  Bone Density status: Completed 06/01/2022. Results reflect: Bone density results: OSTEOPOROSIS. Repeat every 2 years.  Lung Cancer Screening: (Low Dose CT Chest recommended if Age 64-80 years, 20 pack-year currently smoking OR have quit w/in 15years.) does not qualify.   Lung Cancer Screening Referral: n/a  Additional Screening:  Hepatitis C Screening: does not qualify; Completed 03/08/2018  Vision Screening: Recommended annual ophthalmology exams for early detection of glaucoma and other disorders of the eye. Is the patient up to date with their annual eye exam?  Yes  Who is the provider or what is the name of the office in which the patient attends annual eye exams? Dr.Jackson  If pt is not established with a provider, would they like to be referred to a provider to establish care? No .   Dental Screening: Recommended annual dental exams  for proper oral hygiene   Community Resource Referral / Chronic Care Management: CRR required this visit?  No   CCM required this visit?  No     Plan:     I have personally reviewed and noted the following in the patient's chart:   Medical and social history Use of alcohol, tobacco or illicit drugs  Current medications and supplements including opioid prescriptions. Patient is not currently taking opioid prescriptions. Functional ability and status Nutritional status Physical activity Advanced directives List of other physicians Hospitalizations, surgeries, and ER visits in previous 12 months Vitals Screenings to include cognitive, depression, and falls Referrals and appointments  In addition, I have reviewed and discussed with patient certain preventive protocols, quality metrics, and best practice recommendations. A written personalized care plan for preventive services as well as general preventive health  recommendations were provided to patient.     Lorrene Reid, LPN   [id number]/04/[id number]   After Visit Summary: (MyChart) Due to this being a telephonic visit, the after visit summary with patients personalized plan was offered to patient via MyChart   Nurse Notes: none

## 2023-05-11 NOTE — Patient Instructions (Signed)
Tracy [name] , Thank you for taking time to come for your Medicare Wellness Visit. I appreciate your ongoing commitment to your health goals. Please review the following plan we discussed and let me know if I can assist you in the future.   Referrals/Orders/Follow-Ups/Clinician Recommendations: Aim for 30 minutes of exercise or brisk walking, 6-8 glasses of water, and 5 servings of fruits and vegetables each day.   This is a list of the screening recommended for you and due dates:  Health Maintenance  Topic Date Due   Zoster (Shingles) Vaccine (1 of 2) 11/16/2001   Flu Shot  02/23/2023   COVID-19 Vaccine (3 - 2023-24 season) 03/26/2023   Mammogram  06/21/2023   Medicare Annual Wellness Visit  05/10/2024   DTaP/Tdap/Td vaccine (4 - Tdap) 03/31/2027   Colon Cancer Screening  02/09/2032   Pneumonia Vaccine  Completed   DEXA scan (bone density measurement)  Completed   Hepatitis C Screening  Completed   HPV Vaccine  Aged Out    Advanced directives: (Copy Requested) Please bring a copy of your health care power of attorney and living will to the office to be added to your chart at your convenience.  Next Medicare Annual Wellness Visit scheduled for next year: Yes  Insert Preventive Care attachment Insert FALL PREVENTION attachment if needed

## 2023-05-12 ENCOUNTER — Encounter: Payer: Self-pay | Admitting: Family Medicine

## 2023-05-12 ENCOUNTER — Ambulatory Visit (INDEPENDENT_AMBULATORY_CARE_PROVIDER_SITE_OTHER): Payer: Medicare HMO | Admitting: Family Medicine

## 2023-05-12 VITALS — BP 98/58 | HR 73 | Temp 98.0°F | Ht 62.0 in | Wt 116.4 lb

## 2023-05-12 DIAGNOSIS — Z0000 Encounter for general adult medical examination without abnormal findings: Secondary | ICD-10-CM

## 2023-05-12 DIAGNOSIS — G9332 Myalgic encephalomyelitis/chronic fatigue syndrome: Secondary | ICD-10-CM

## 2023-05-12 DIAGNOSIS — R252 Cramp and spasm: Secondary | ICD-10-CM

## 2023-05-12 DIAGNOSIS — N952 Postmenopausal atrophic vaginitis: Secondary | ICD-10-CM | POA: Diagnosis not present

## 2023-05-12 DIAGNOSIS — E559 Vitamin D deficiency, unspecified: Secondary | ICD-10-CM

## 2023-05-12 DIAGNOSIS — Z1231 Encounter for screening mammogram for malignant neoplasm of breast: Secondary | ICD-10-CM

## 2023-05-12 DIAGNOSIS — E538 Deficiency of other specified B group vitamins: Secondary | ICD-10-CM

## 2023-05-12 DIAGNOSIS — M810 Age-related osteoporosis without current pathological fracture: Secondary | ICD-10-CM | POA: Diagnosis not present

## 2023-05-12 DIAGNOSIS — Z1211 Encounter for screening for malignant neoplasm of colon: Secondary | ICD-10-CM

## 2023-05-12 DIAGNOSIS — E7800 Pure hypercholesterolemia, unspecified: Secondary | ICD-10-CM

## 2023-05-12 NOTE — Patient Instructions (Addendum)
Keep walking  Add some strength training to your routine, this is important for bone and brain health and can reduce your risk of falls and help your body use insulin properly and regulate weight  Light weights, exercise bands , and internet videos are a good way to start  Yoga (chair or regular), machines , floor exercises or a gym with machines are also good options   For muscle cramps Try a teaspoon of mustard daily  Try magnesium 250 mg daily over the counter (watch out for diarrhea) Stretch Keep warm   You can try holding your raloxifene 1-2 weeks and see if this helps your cramps (let me know if it does)    Call and schedule your mammogram  You have an order for:  []   2D Mammogram  [x]   3D Mammogram  []   Bone Density     Please call for appointment:   [x]   [facility]  [address] [place] [address]  [phone]  []   [facility])   [address]  [place], [place] [address]  [phone]  []   [facility]      [address] [place], [place]        [phone]         []   [facility]  [address] [place], [place]  [phone]  []  [facility]   [address]   [place], [place] [address]  [phone]  []  [facility]  [address] [place], [place] [address] [phone]    Make sure to wear two piece clothing  No lotions powders or deodorants the day of the appointment Make sure to bring picture ID and insurance card.  Bring list of medications you are currently taking including any supplements.   Schedule your screening mammogram through MyChart!   Select [place] imaging sites can now be scheduled through MyChart.  Log into your MyChart account.  Go to 'Visit' (or 'Appointments' if  on  mobile App) --> Schedule an  Appointment  Under 'Select a Reason for Visit' choose the Mammogram  Screening option.  Complete the pre-visit questions  and select the time and place that  best fits your schedule

## 2023-05-12 NOTE — Progress Notes (Unsigned)
Subjective:    Patient ID: Tracy [name], female    DOB: 03/06/1951, 72 y.o.   MRN: [id number]  HPI  Here for health maintenance exam and to review chronic medical problems   Wt Readings from Last 3 Encounters:  05/12/23 116 lb 6 oz (52.8 kg)  05/11/23 114 lb (51.7 kg)  05/09/22 117 lb 4 oz (53.2 kg)   21.29 kg/m  Vitals:   05/12/23 1051  BP: (!) 98/58  Pulse: 73  Temp: 98 F (36.7 C)  SpO2: 100%    Immunization History  Administered Date(s) Administered   Fluad Quad(high Dose 65+) 03/25/2019, 04/01/2020   Influenza, High Dose Seasonal PF 03/30/2017   Influenza,inj,Quad PF,6+ Mos 09/08/2016, 04/26/2018   Influenza-Unspecified 04/24/2013, 05/05/2014, 05/04/2015   PFIZER(Purple Top)SARS-COV-2 Vaccination 10/08/2019, 10/29/2019   Pneumococcal Conjugate-13 03/03/2017   Pneumococcal Polysaccharide-23 03/08/2018   Td 07/25/1996, 01/09/2007, 03/30/2017   Zoster, Live 02/14/2012    There are no preventive care reminders to display for this patient.  Flu shot -declines due to chronic fatigue syndrome   Shingrix vaccine -declines due to chronic fatigue syndrome   More chronic fatigue flares lately  Some good and bad days  She keeps on   Her neighbor suggested she see hematologist    M[facility] 05/2022 [facility]  Wants to schedule  Self breast exam-no lumps/does not do them often   Gyn health Estrace cream for vaginal dryness Does not need anymore    Colon cancer screening -colonoscopy 01/2022   Bone health  Dexa 05/2022  osteoporosis  Evista year 3: of 2nd course after drug holiday  Oral bisphosphonate caused gi problems  Falls- none  Fractures-none  Supplements  Last vitamin D Lab Results  Component Value Date   VD25OH 44.65 05/05/2023  Taking vitamin D and Ca and K   Exercise  When not in flare for CFS whe walks at home  30 minutes daily  Some stretches    Had cataract surgery in both eyes  It went well    Mood    05/11/2023     1:37 PM 05/09/2022    8:46 AM 05/06/2021    8:10 AM 04/01/2020   10:36 AM 03/24/2020   12:19 PM  Depression screen PHQ 2/9  Decreased Interest 0 0 0 0 0  Down, Depressed, Hopeless 0 0 0 0 0  PHQ - 2 Score 0 0 0 0 0  Altered sleeping  0  0 0  Tired, decreased energy  1  0 0  Change in appetite  0  0 0  Feeling bad or failure about yourself   0  0 0  Trouble concentrating    0 0  Moving slowly or fidgety/restless  0  0 0  Suicidal thoughts  0  0 0  PHQ-9 Score  1  0 0  Difficult doing work/chores     Not difficult at all    Hyperlipidemia Lab Results  Component Value Date   CHOL 172 05/05/2023   CHOL 178 05/04/2022   CHOL 174 03/26/2021   Lab Results  Component Value Date   HDL 68.20 05/05/2023   HDL 75.30 05/04/2022   HDL 66.80 03/26/2021   Lab Results  Component Value Date   LDLCALC 86 05/05/2023   LDLCALC 89 05/04/2022   LDLCALC 88 03/26/2021   Lab Results  Component Value Date   TRIG 88.0 05/05/2023   TRIG 72.0 05/04/2022   TRIG 97.0 03/26/2021   Lab Results  Component Value  Date   CHOLHDL 3 05/05/2023   CHOLHDL 2 05/04/2022   CHOLHDL 3 03/26/2021   Lab Results  Component Value Date   LDLDIRECT 172.6 05/20/2013   Simvastatin 40 mg daily   Vit B12 def Lab Results  Component Value Date   VITAMINB12 428 05/05/2023   Takes 500 mcg daily   Other labs  Lab Results  Component Value Date   NA 143 05/05/2023   K 4.4 05/05/2023   CO2 30 05/05/2023   GLUCOSE 82 05/05/2023   BUN 10 05/05/2023   CREATININE 0.61 05/05/2023   CALCIUM 9.2 05/05/2023   GFR 89.92 05/05/2023   GFRNONAA 94.39 01/29/2010   Lab Results  Component Value Date   ALT 14 05/05/2023   AST 21 05/05/2023   ALKPHOS 36 (L) 05/05/2023   BILITOT 0.6 05/05/2023   Lab Results  Component Value Date   WBC 5.8 05/05/2023   HGB 13.7 05/05/2023   HCT 42.7 05/05/2023   MCV 96.9 05/05/2023   PLT 188.0 05/05/2023   Lab Results  Component Value Date   TSH 1.85 05/05/2023      Patient Active Problem List   Diagnosis Date Noted   Nocturnal muscle cramps 05/12/2023   Angiomyolipoma of left kidney 03/11/2020   Estrogen deficiency 03/03/2017   Borderline low blood pressure determined by examination 10/31/2016   Vitamin B12 deficiency 10/31/2016   Colon cancer screening 02/17/2014   Encounter for screening mammogram for breast cancer 02/07/2011   Routine general medical examination at a health care facility 02/01/2011   Vitamin D deficiency 04/14/2008   Osteoporosis 01/09/2007   Hyperlipidemia 01/04/2007   Atrophic vaginitis 01/04/2007   SCOLIOSIS 01/04/2007   Chronic fatigue syndrome 01/04/2007   Past Medical History:  Diagnosis Date   Chronic fatigue syndrome    Frequency of urination    Hemorrhoids    Hiatal hernia    History of gastroesophageal reflux (GERD)    w/ hx gastritis   Hyperlipidemia    Osteoporosis    Prolapse of anterior vaginal wall    Wears glasses    Past Surgical History:  Procedure Laterality Date   COLONOSCOPY  2015   CYSTOCELE REPAIR N/A 03/15/2021   Procedure: ANTERIOR REPAIR (CYSTOCELE);  Surgeon: Marguerita Beards, MD;  Location: [facility];  Service: Gynecology;  Laterality: N/A;  total time requested 1.5hrs   CYSTOSCOPY N/A 03/15/2021   Procedure: CYSTOSCOPY;  Surgeon: Marguerita Beards, MD;  Location: [facility];  Service: Gynecology;  Laterality: N/A;   DILATION AND CURETTAGE OF UTERUS     yrs ago per pt for missed ab   ESOPHAGOGASTRODUODENOSCOPY  10/2015   TOTAL ABDOMINAL HYSTERECTOMY  1991   per pt w/ USO,  unsure which side   Social History   Tobacco Use   Smoking status: Never   Smokeless tobacco: Never  Vaping Use   Vaping status: Never Used  Substance Use Topics   Alcohol use: No    Alcohol/week: 0.0 standard drinks of alcohol   Drug use: Never   Family History  Problem Relation Age of Onset   Stroke Mother    Hypertension Father    Stroke Father     Hyperlipidemia Sister    Hyperlipidemia Brother    Cancer Paternal Uncle    Cancer Maternal Grandmother        intestinal CA   Cancer Paternal Grandfather        CA unsure which kind   Hyperlipidemia Brother  Heart attack Sister        Thought to be silent MI   Breast cancer Neg Hx    Allergies  Allergen Reactions   Alendronate Sodium     REACTION: GI issues   Amoxicillin Nausea And Vomiting   Ivp Dye [Iodinated Contrast Media] Rash   Motrin [Ibuprofen] Rash    PER PT ONLY MOTRIN BRAND, THIS HAPPENED MANY YRS AGO.  CAN TAKE ADVIL BRAND ONLY    Current Outpatient Medications on File Prior to Visit  Medication Sig Dispense Refill   Calcium-Vitamin D-Vitamin K 650-12.5-40 MG-MCG-MCG CHEW Chew 2 tablets by mouth 2 (two) times daily.     Cholecalciferol (VITAMIN D) 2000 UNITS CAPS Take 1 capsule by mouth daily.     raloxifene (EVISTA) 60 MG tablet TAKE 1 TABLET BY MOUTH EVERY DAY 90 tablet 1   simvastatin (ZOCOR) 40 MG tablet TAKE (1) TABLET BY MOUTH DAILY AT BEDTIME 90 tablet 0   vitamin B-12 (CYANOCOBALAMIN) 500 MCG tablet Take 500 mcg by mouth daily.     No current facility-administered medications on file prior to visit.    Review of Systems  Constitutional:  Positive for fatigue. Negative for activity change, appetite change, fever and unexpected weight change.  HENT:  Negative for congestion, ear pain, rhinorrhea, sinus pressure and sore throat.   Eyes:  Negative for pain, redness and visual disturbance.  Respiratory:  Negative for cough, shortness of breath and wheezing.   Cardiovascular:  Negative for chest pain and palpitations.  Gastrointestinal:  Negative for abdominal pain, blood in stool, constipation and diarrhea.  Endocrine: Negative for polydipsia and polyuria.  Genitourinary:  Negative for dysuria, frequency and urgency.  Musculoskeletal:  Positive for myalgias. Negative for back pain.       Muscle cramps  Skin:  Negative for pallor and rash.   Allergic/Immunologic: Negative for environmental allergies.  Neurological:  Negative for dizziness, syncope and headaches.  Hematological:  Negative for adenopathy. Does not bruise/bleed easily.  Psychiatric/Behavioral:  Negative for decreased concentration and dysphoric mood. The patient is not nervous/anxious.        Objective:   Physical Exam Constitutional:      General: She is not in acute distress.    Appearance: Normal appearance. She is well-developed and normal weight. She is not ill-appearing or diaphoretic.  HENT:     Head: Normocephalic and atraumatic.     Right Ear: Tympanic membrane, ear canal and external ear normal.     Left Ear: Tympanic membrane, ear canal and external ear normal.     Nose: Nose normal. No congestion.     Mouth/Throat:     Mouth: Mucous membranes are moist.     Pharynx: Oropharynx is clear. No posterior oropharyngeal erythema.  Eyes:     General: No scleral icterus.    Extraocular Movements: Extraocular movements intact.     Conjunctiva/sclera: Conjunctivae normal.     Pupils: Pupils are equal, round, and reactive to light.  Neck:     Thyroid: No thyromegaly.     Vascular: No carotid bruit or JVD.  Cardiovascular:     Rate and Rhythm: Normal rate and regular rhythm.     Pulses: Normal pulses.     Heart sounds: Normal heart sounds.     No gallop.  Pulmonary:     Effort: Pulmonary effort is normal. No respiratory distress.     Breath sounds: Normal breath sounds. No wheezing.     Comments: Good air exch Chest:  Chest wall: No tenderness.  Abdominal:     General: Bowel sounds are normal. There is no distension or abdominal bruit.     Palpations: Abdomen is soft. There is no mass.     Tenderness: There is no abdominal tenderness.     Hernia: No hernia is present.  Genitourinary:    Comments: Breast exam: No mass, nodules, thickening, tenderness, bulging, retraction, inflamation, nipple discharge or skin changes noted.  No axillary or  clavicular LA.     Musculoskeletal:        General: No tenderness. Normal range of motion.     Cervical back: Normal range of motion and neck supple. No rigidity. No muscular tenderness.     Right lower leg: No edema.     Left lower leg: No edema.     Comments: No kyphosis   Lymphadenopathy:     Cervical: No cervical adenopathy.  Skin:    General: Skin is warm and dry.     Coloration: Skin is not pale.     Findings: No erythema or rash.     Comments: Solar lentigines diffusely   Neurological:     Mental Status: She is alert. Mental status is at baseline.     Cranial Nerves: No cranial nerve deficit.     Motor: No abnormal muscle tone.     Coordination: Coordination normal.     Gait: Gait normal.     Deep Tendon Reflexes: Reflexes are normal and symmetric. Reflexes normal.  Psychiatric:        Mood and Affect: Mood normal.        Cognition and Memory: Cognition and memory normal.           Assessment & Plan:   Problem List Items Addressed This Visit       Nervous and Auditory   Chronic fatigue syndrome    Ongoing Waxes and wanes Encouraged exercise as tolerated and good self care  She declines immunizations         Musculoskeletal and Integument   Osteoporosis    On 2nd course of evista after drug holiday No falls or fracture D leve in normal range Discussed fall prevention, supplements and exercise for bone density  Dexa 05/2022         Genitourinary   Atrophic vaginitis    Pt is no longer using estrace cream Does not feel she needs it          Other   Colon cancer screening    Colonoscopy 01/2022      Encounter for screening mammogram for breast cancer    Mammogram ordered Pt will call to schedule      Relevant Orders   MM 3D SCREENING MAMMOGRAM BILATERAL BREAST   Hyperlipidemia    Disc goals for lipids and reasons to control them Rev last labs with pt Rev low sat fat diet in detail Well controlled with simvastatin 40 mg daily and diet        Nocturnal muscle cramps    Worse lately  Discussed trial of magnesium (with caution) Mustard Stretching  Handout given   Noted she takes evista-has not caused problems before but can be side effect/ may do trial off of it to see Also on statin (more likely to cause muscle aches then cramps)      Routine general medical examination at a health care facility - Primary    Reviewed health habits including diet and exercise and skin cancer prevention Reviewed appropriate screening  tests for age  Also reviewed health mt list, fam hx and immunization status , as well as social and family history   See HPI Labs reviewed and ordered Declines vaccines including flu and shingrix  Mammogram due in nov-order done  Colonoscopy 01/2022 Discussed fall prevention, supplements and exercise for bone density   Cexa 05/2022 PHQ 0      Vitamin B12 deficiency    Lab Results  Component Value Date   VITAMINB12 428 05/05/2023   Continues oral 500 mcg daily       Vitamin D deficiency    Vitamin D level is therapeutic with current supplementation Disc importance of this to bone and overall health Last vitamin D Lab Results  Component Value Date   VD25OH 44.65 05/05/2023

## 2023-05-14 NOTE — Assessment & Plan Note (Signed)
Colonoscopy 01/2022

## 2023-05-14 NOTE — Assessment & Plan Note (Signed)
Mammogram ordered °Pt will call to schedule  °

## 2023-05-14 NOTE — Assessment & Plan Note (Signed)
Pt is no longer using estrace cream Does not feel she needs it

## 2023-05-14 NOTE — Assessment & Plan Note (Signed)
Disc goals for lipids and reasons to control them Rev last labs with pt Rev low sat fat diet in detail Well controlled with simvastatin 40 mg daily and diet

## 2023-05-14 NOTE — Assessment & Plan Note (Signed)
Ongoing Waxes and wanes Encouraged exercise as tolerated and good self care  She declines immunizations

## 2023-05-14 NOTE — Assessment & Plan Note (Signed)
Worse lately  Discussed trial of magnesium (with caution) Mustard Stretching  Handout given   Noted she takes evista-has not caused problems before but can be side effect/ may do trial off of it to see Also on statin (more likely to cause muscle aches then cramps)

## 2023-05-14 NOTE — Assessment & Plan Note (Signed)
On 2nd course of evista after drug holiday No falls or fracture D leve in normal range Discussed fall prevention, supplements and exercise for bone density  Dexa 05/2022

## 2023-05-14 NOTE — Assessment & Plan Note (Signed)
Vitamin D level is therapeutic with current supplementation Disc importance of this to bone and overall health Last vitamin D Lab Results  Component Value Date   VD25OH 44.65 05/05/2023

## 2023-05-14 NOTE — Assessment & Plan Note (Signed)
Lab Results  Component Value Date   VITAMINB12 428 05/05/2023   Continues oral 500 mcg daily

## 2023-05-14 NOTE — Assessment & Plan Note (Signed)
Reviewed health habits including diet and exercise and skin cancer prevention Reviewed appropriate screening tests for age  Also reviewed health mt list, fam hx and immunization status , as well as social and family history   See HPI Labs reviewed and ordered Declines vaccines including flu and shingrix  Mammogram due in nov-order done  Colonoscopy 01/2022 Discussed fall prevention, supplements and exercise for bone density   Cexa 05/2022 PHQ 0

## 2023-06-08 ENCOUNTER — Telehealth: Payer: Self-pay | Admitting: Family Medicine

## 2023-06-08 ENCOUNTER — Telehealth: Payer: Self-pay

## 2023-06-08 NOTE — Telephone Encounter (Signed)
Patient called in regarding her AWV phone call that was conducted on 05/11/2023.She was looking over the notes and see that under "substance and sexual activity" section the nurse put: Substance and Sexual Activity   Alcohol use: No      Alcohol/week: 0.0 standard drinks of alcohol   Drug use: Never   Sexual activity: Yes      Partners: Female  She was not very happy about this and would like it to be changed to "no" said that her partner is not a female.

## 2023-06-08 NOTE — Telephone Encounter (Signed)
Spoke with patient regarding her request to a correct her medical/sexual history , advised patient my supervisor Amy Hopkins assited in correcting it in the chart and apologized for any inconvience, patient was thankful and appreciated the update -L.Wilson,LPN

## 2023-06-27 ENCOUNTER — Ambulatory Visit
Admission: RE | Admit: 2023-06-27 | Discharge: 2023-06-27 | Disposition: A | Discharge status: Home / Self Care | Payer: Medicare HMO | Source: Ambulatory Visit | Attending: Family Medicine | Admitting: Family Medicine

## 2023-06-27 DIAGNOSIS — Z1231 Encounter for screening mammogram for malignant neoplasm of breast: Secondary | ICD-10-CM | POA: Diagnosis not present

## 2023-07-19 ENCOUNTER — Other Ambulatory Visit: Payer: Self-pay | Admitting: Family Medicine

## 2023-07-20 ENCOUNTER — Other Ambulatory Visit: Payer: Self-pay | Admitting: Family Medicine

## 2023-09-07 DIAGNOSIS — D2262 Melanocytic nevi of left upper limb, including shoulder: Secondary | ICD-10-CM | POA: Diagnosis not present

## 2023-09-07 DIAGNOSIS — D2271 Melanocytic nevi of right lower limb, including hip: Secondary | ICD-10-CM | POA: Diagnosis not present

## 2023-09-07 DIAGNOSIS — D225 Melanocytic nevi of trunk: Secondary | ICD-10-CM | POA: Diagnosis not present

## 2023-09-07 DIAGNOSIS — L821 Other seborrheic keratosis: Secondary | ICD-10-CM | POA: Diagnosis not present

## 2023-09-07 DIAGNOSIS — D2272 Melanocytic nevi of left lower limb, including hip: Secondary | ICD-10-CM | POA: Diagnosis not present

## 2023-09-07 DIAGNOSIS — D2261 Melanocytic nevi of right upper limb, including shoulder: Secondary | ICD-10-CM | POA: Diagnosis not present

## 2023-10-09 ENCOUNTER — Ambulatory Visit: Payer: Self-pay | Admitting: Family Medicine

## 2023-10-09 NOTE — Telephone Encounter (Signed)
 Aware, will watch for correspondence

## 2023-10-09 NOTE — Telephone Encounter (Signed)
 Copied from CRM [id number]. Topic: Clinical - Red Word Triage >> Oct 09, 2023 12:48 PM Gurney Maxin H wrote: [facility] that prompted transfer to Nurse Triage: Irritation in vaginal area, some burning, frequent urination  Chief Complaint: burning and irritation to vaginal area Symptoms: burns when she urinates, frequency Frequency: constant Pertinent Negatives: Patient denies injury, discharge, fever, flank pain Disposition: [] ED /[x] Urgent Care (no appt availability in office) / [] Appointment(In office/virtual)/ []  [facility]/ [] Home Care/ [] Refused Recommended Disposition /[] [facility]/ []  Follow-up with PCP Additional Notes: no apt available for today; instructed to go to UC.  Care advice given, denies questions; instructed to go to ER if becomes worse.   Reason for Disposition  [1] Symptoms of a yeast infection (i.e., itchy, white discharge, not bad smelling) AND [2] not improved > 3 days following Care Advice  Answer Assessment - Initial Assessment Questions 1. SYMPTOM: "What's the main symptom you're concerned about?" (e.g., pain, itching, dryness)     Burning and frequent urination 2. LOCATION: "Where is the  burning located?" (e.g., inside/outside, left/right)     Vaginal area 3. ONSET: "When did the  burning  start?"     The other day 4. PAIN: "Is there any pain?" If Yes, ask: "How bad is it?" (Scale: 1-10; mild, moderate, severe)   -  MILD (1-3): Doesn't interfere with normal activities.    -  MODERATE (4-7): Interferes with normal activities (e.g., work or school) or awakens from sleep.     -  SEVERE (8-10): Excruciating pain, unable to do any normal activities.     Denies  5. ITCHING: "Is there any itching?" If Yes, ask: "How bad is it?" (Scale: 1-10; mild, moderate, severe)     denies 6. CAUSE: "What do you think is causing the discharge?" "Have you had the same problem before? What happened then?"     "Particles" in urine; denies discharge 7. OTHER  SYMPTOMS: "Do you have any other symptoms?" (e.g., fever, itching, vaginal bleeding, pain with urination, injury to genital area, vaginal foreign body)     denies 8. PREGNANCY: "Is there any chance you are pregnant?" "When was your last menstrual period?"     na  Protocols used: Vaginal Symptoms-A-AH

## 2023-10-10 DIAGNOSIS — Z6820 Body mass index (BMI) 20.0-20.9, adult: Secondary | ICD-10-CM | POA: Diagnosis not present

## 2023-10-10 DIAGNOSIS — R300 Dysuria: Secondary | ICD-10-CM | POA: Diagnosis not present

## 2024-01-05 ENCOUNTER — Other Ambulatory Visit: Payer: Self-pay | Admitting: Family Medicine

## 2024-01-11 ENCOUNTER — Other Ambulatory Visit: Payer: Self-pay | Admitting: Family Medicine

## 2024-04-25 DIAGNOSIS — Z961 Presence of intraocular lens: Secondary | ICD-10-CM | POA: Diagnosis not present

## 2024-04-30 ENCOUNTER — Telehealth: Payer: Self-pay | Admitting: Family Medicine

## 2024-04-30 DIAGNOSIS — E7800 Pure hypercholesterolemia, unspecified: Secondary | ICD-10-CM

## 2024-04-30 DIAGNOSIS — E538 Deficiency of other specified B group vitamins: Secondary | ICD-10-CM

## 2024-04-30 DIAGNOSIS — E559 Vitamin D deficiency, unspecified: Secondary | ICD-10-CM

## 2024-04-30 DIAGNOSIS — M810 Age-related osteoporosis without current pathological fracture: Secondary | ICD-10-CM

## 2024-04-30 NOTE — Telephone Encounter (Signed)
-----   Message from Veva JINNY Ferrari sent at 04/23/2024 10:47 AM EDT ----- Regarding: Lab orders for Wed, 10.8.25 Patient is scheduled for CPX labs, please order future labs, Thanks , Veva

## 2024-05-06 ENCOUNTER — Other Ambulatory Visit (INDEPENDENT_AMBULATORY_CARE_PROVIDER_SITE_OTHER): Payer: Medicare HMO

## 2024-05-06 ENCOUNTER — Ambulatory Visit: Payer: Self-pay | Admitting: Family Medicine

## 2024-05-06 DIAGNOSIS — E538 Deficiency of other specified B group vitamins: Secondary | ICD-10-CM | POA: Diagnosis not present

## 2024-05-06 DIAGNOSIS — E559 Vitamin D deficiency, unspecified: Secondary | ICD-10-CM | POA: Diagnosis not present

## 2024-05-06 DIAGNOSIS — E7800 Pure hypercholesterolemia, unspecified: Secondary | ICD-10-CM

## 2024-05-06 LAB — CBC WITH DIFFERENTIAL/PLATELET
Basophils Absolute: 0.1 K/uL (ref 0.0–0.1)
Basophils Relative: 1 % (ref 0.0–3.0)
Eosinophils Absolute: 0.1 K/uL (ref 0.0–0.7)
Eosinophils Relative: 1.5 % (ref 0.0–5.0)
HCT: 42.9 % (ref 36.0–46.0)
Hemoglobin: 13.9 g/dL (ref 12.0–15.0)
Lymphocytes Relative: 47.3 % — ABNORMAL HIGH (ref 12.0–46.0)
Lymphs Abs: 2.6 K/uL (ref 0.7–4.0)
MCHC: 32.3 g/dL (ref 30.0–36.0)
MCV: 94.7 fl (ref 78.0–100.0)
Monocytes Absolute: 0.5 K/uL (ref 0.1–1.0)
Monocytes Relative: 9.2 % (ref 3.0–12.0)
Neutro Abs: 2.2 K/uL (ref 1.4–7.7)
Neutrophils Relative %: 41 % — ABNORMAL LOW (ref 43.0–77.0)
Platelets: 172 K/uL (ref 150.0–400.0)
RBC: 4.53 Mil/uL (ref 3.87–5.11)
RDW: 14 % (ref 11.5–15.5)
WBC: 5.4 K/uL (ref 4.0–10.5)

## 2024-05-06 LAB — COMPREHENSIVE METABOLIC PANEL WITH GFR
ALT: 14 U/L (ref 0–35)
AST: 20 U/L (ref 0–37)
Albumin: 4.1 g/dL (ref 3.5–5.2)
Alkaline Phosphatase: 36 U/L — ABNORMAL LOW (ref 39–117)
BUN: 8 mg/dL (ref 6–23)
CO2: 30 meq/L (ref 19–32)
Calcium: 8.8 mg/dL (ref 8.4–10.5)
Chloride: 106 meq/L (ref 96–112)
Creatinine, Ser: 0.7 mg/dL (ref 0.40–1.20)
GFR: 86.37 mL/min (ref >60.00)
Glucose, Bld: 83 mg/dL (ref 70–99)
Potassium: 4.2 meq/L (ref 3.5–5.1)
Sodium: 143 meq/L (ref 135–145)
Total Bilirubin: 0.6 mg/dL (ref 0.2–1.2)
Total Protein: 5.7 g/dL — ABNORMAL LOW (ref 6.0–8.3)

## 2024-05-06 LAB — VITAMIN D 25 HYDROXY (VIT D DEFICIENCY, FRACTURES): VITD: 42.89 ng/mL (ref 30.00–100.00)

## 2024-05-06 LAB — LIPID PANEL
Cholesterol: 180 mg/dL (ref 0–200)
HDL: 78.4 mg/dL (ref >39.00)
LDL Cholesterol: 90 mg/dL (ref 0–99)
NonHDL: 101.16
Total CHOL/HDL Ratio: 2
Triglycerides: 58 mg/dL (ref 0.0–149.0)
VLDL: 11.6 mg/dL (ref 0.0–40.0)

## 2024-05-06 LAB — TSH: TSH: 2.08 u[IU]/mL (ref 0.35–5.50)

## 2024-05-06 LAB — VITAMIN B12: Vitamin B-12: 480 pg/mL (ref 211–911)

## 2024-05-13 ENCOUNTER — Ambulatory Visit: Payer: Medicare HMO

## 2024-05-13 ENCOUNTER — Encounter: Payer: Self-pay | Admitting: Family Medicine

## 2024-05-13 ENCOUNTER — Ambulatory Visit: Payer: Medicare HMO | Admitting: Family Medicine

## 2024-05-13 VITALS — BP 128/62 | Ht 62.0 in | Wt 113.0 lb

## 2024-05-13 VITALS — BP 128/62 | HR 59 | Temp 97.8°F | Ht 62.0 in | Wt 115.4 lb

## 2024-05-13 DIAGNOSIS — Z0000 Encounter for general adult medical examination without abnormal findings: Secondary | ICD-10-CM | POA: Diagnosis not present

## 2024-05-13 DIAGNOSIS — G9332 Myalgic encephalomyelitis/chronic fatigue syndrome: Secondary | ICD-10-CM | POA: Diagnosis not present

## 2024-05-13 DIAGNOSIS — E538 Deficiency of other specified B group vitamins: Secondary | ICD-10-CM | POA: Diagnosis not present

## 2024-05-13 DIAGNOSIS — M810 Age-related osteoporosis without current pathological fracture: Secondary | ICD-10-CM

## 2024-05-13 DIAGNOSIS — E7800 Pure hypercholesterolemia, unspecified: Secondary | ICD-10-CM | POA: Diagnosis not present

## 2024-05-13 DIAGNOSIS — E559 Vitamin D deficiency, unspecified: Secondary | ICD-10-CM

## 2024-05-13 DIAGNOSIS — Z1231 Encounter for screening mammogram for malignant neoplasm of breast: Secondary | ICD-10-CM

## 2024-05-13 MED ORDER — SIMVASTATIN 40 MG PO TABS: ORAL_TABLET | ORAL | 3 refills | Status: AC

## 2024-05-13 MED ORDER — RALOXIFENE HCL 60 MG PO TABS
60.0000 mg | ORAL_TABLET | Freq: Every day | ORAL | 3 refills | Status: AC
Start: 2024-05-13 — End: ?

## 2024-05-13 NOTE — Assessment & Plan Note (Signed)
 Disc goals for lipids and reasons to control them Rev last labs with pt Rev low sat fat diet in detail Well controlled with simvastatin  40 mg daily and diet   LDL stable at 90

## 2024-05-13 NOTE — Patient Instructions (Addendum)
 Keep walking Add some strength training to your routine, this is important for bone and brain health and can reduce your risk of falls and help your body use insulin properly and regulate weight   Light weights, exercise bands , and internet videos are a good way to start  Yoga (chair or regular), machines , floor exercises or a gym with machines are also good options    Take care of yourself     You have an order for:  [x]   3D Mammogram  [x]   Bone Density     Please call for appointment:   [x]   [facility]  [address] [place] [address]  [phone]  []   [facility])   [address]  [place], [place] [address]  [phone]  []   [facility]      [address] [place], [place]        [phone]         []   [facility]  [address] [place], [place]  [phone]  []  [facility]   [address]   [place], [place] [address]  [phone]  []  [facility]  [address] [place], [place] [address] [phone]    Make sure to wear two piece clothing  No lotions powders or deodorants the day of the appointment Make sure to bring picture ID and insurance card.  Bring list of medications you are currently taking including any supplements.   Schedule your screening mammogram through MyChart!   Select [facility] imaging sites can now be scheduled through MyChart.  Log into your MyChart account.  Go to 'Visit' (or 'Appointments' if  on mobile App) --> Schedule an  Appointment  Under 'Select a Reason for Visit' choose the Mammogram  Screening option.  Complete the pre-visit questions  and select the time and place that  best fits your schedule

## 2024-05-13 NOTE — Progress Notes (Signed)
 Subjective:    Patient ID: Tracy [name], female    DOB: 07/06/1951, 73 y.o.   MRN: [id number]  HPI  Here for health maintenance exam and to review chronic medical problems   Wt Readings from Last 3 Encounters:  05/13/24 115 lb 6 oz (52.3 kg)  05/12/23 116 lb 6 oz (52.8 kg)  05/11/23 114 lb (51.7 kg)   21.10 kg/m  Vitals:   05/13/24 1052  BP: 128/62  Pulse: (!) 59  Temp: 97.8 F (36.6 C)  SpO2: 99%    Immunization History  Administered Date(s) Administered   Fluad Quad(high Dose 65+) 03/25/2019, 04/01/2020   INFLUENZA, HIGH DOSE SEASONAL PF 03/30/2017   Influenza,inj,Quad PF,6+ Mos 09/08/2016, 04/26/2018   Influenza-Unspecified 04/24/2013, 05/05/2014, 05/04/2015   PFIZER(Purple Top)SARS-COV-2 Vaccination 10/08/2019, 10/29/2019   Pneumococcal Conjugate-13 03/03/2017   Pneumococcal Polysaccharide-23 03/08/2018   Td 07/25/1996, 01/09/2007, 03/30/2017   Zoster, Live 02/14/2012    Health Maintenance Due  Topic Date Due   Medicare Annual Wellness (AWV)  05/13/2024   Mammogram  06/26/2024   Staying busy Fatigue/chronic is a roller coaster  Has lived with for years  Some days are very hard  Taking care of herself    Has amw planned later today  Declines vaccines   Mammogram 06/2023 Self breast exam-no lumps   Gyn health Hysterectomy    Colon cancer screening  Colonoscopy 01/2022 with 10 y recall if applicable   Bone health  Dexa  05/2022  osteoporosis  Takes evista  (2nd course after drug holiday) year 3-4  (since 2021) Falls-had a fall wearing sketchers shoes in the house /shoe caught on [employer] (thankful)  Supplements - ca and D  Last vitamin D  Lab Results  Component Value Date   VD25OH 42.89 05/06/2024    Exercise  Walking -in the house /has a path (intol of outdoor exercise)  No strength training yet    Mood    05/11/2023    1:37 PM 05/09/2022    8:46 AM 05/06/2021    8:10 AM 04/01/2020   10:36 AM 03/24/2020   12:19 PM   Depression screen PHQ 2/9  Decreased Interest 0 0 0 0 0  Down, Depressed, Hopeless 0 0 0 0 0  PHQ - 2 Score 0 0 0 0 0  Altered sleeping  0  0 0  Tired, decreased energy  1  0 0  Change in appetite  0  0 0  Feeling bad or failure about yourself   0  0 0  Trouble concentrating    0 0  Moving slowly or fidgety/restless  0  0 0  Suicidal thoughts  0  0 0  PHQ-9 Score  1  0 0  Difficult doing work/chores     Not difficult at all    B12 def Lab Results  Component Value Date   VITAMINB12 480 05/06/2024   Oral 500 mcg daily    Hyperlipidemia Lab Results  Component Value Date   CHOL 180 05/06/2024   CHOL 172 05/05/2023   CHOL 178 05/04/2022   Lab Results  Component Value Date   HDL 78.40 05/06/2024   HDL 68.20 05/05/2023   HDL 75.30 05/04/2022   Lab Results  Component Value Date   LDLCALC 90 05/06/2024   LDLCALC 86 05/05/2023   LDLCALC 89 05/04/2022   Lab Results  Component Value Date   TRIG 58.0 05/06/2024   TRIG 88.0 05/05/2023   TRIG 72.0 05/04/2022   Lab Results  Component Value Date   CHOLHDL 2 05/06/2024   CHOLHDL 3 05/05/2023   CHOLHDL 2 05/04/2022   Lab Results  Component Value Date   LDLDIRECT 172.6 05/20/2013   Simvastatin  40 mg daily   Lab Results  Component Value Date   NA 143 05/06/2024   K 4.2 05/06/2024   CO2 30 05/06/2024   GLUCOSE 83 05/06/2024   BUN 8 05/06/2024   CREATININE 0.70 05/06/2024   CALCIUM 8.8 05/06/2024   GFR 86.37 05/06/2024   GFRNONAA 94.39 01/29/2010   Lab Results  Component Value Date   TSH 2.08 05/06/2024   Lab Results  Component Value Date   WBC 5.4 05/06/2024   HGB 13.9 05/06/2024   HCT 42.9 05/06/2024   MCV 94.7 05/06/2024   PLT 172.0 05/06/2024      Patient Active Problem List   Diagnosis Date Noted   Nocturnal muscle cramps 05/12/2023   Angiomyolipoma of left kidney 03/11/2020   Borderline low blood pressure determined by examination 10/31/2016   Vitamin B12 deficiency 10/31/2016   Colon  cancer screening 02/17/2014   Encounter for screening mammogram for breast cancer 02/07/2011   Routine general medical examination at a health care facility 02/01/2011   Vitamin D  deficiency 04/14/2008   Osteoporosis 01/09/2007   Hyperlipidemia 01/04/2007   Atrophic vaginitis 01/04/2007   SCOLIOSIS 01/04/2007   Chronic fatigue syndrome 01/04/2007   Past Medical History:  Diagnosis Date   Chronic fatigue syndrome    Frequency of urination    Hemorrhoids    Hiatal hernia    History of gastroesophageal reflux (GERD)    w/ hx gastritis   Hyperlipidemia    Osteoporosis    Prolapse of anterior vaginal wall    Wears glasses    Past Surgical History:  Procedure Laterality Date   COLONOSCOPY  2015   CYSTOCELE REPAIR N/A 03/15/2021   Procedure: ANTERIOR REPAIR (CYSTOCELE);  Surgeon: Marilynne Rosaline SAILOR, MD;  Location: [facility];  Service: Gynecology;  Laterality: N/A;  total time requested 1.5hrs   CYSTOSCOPY N/A 03/15/2021   Procedure: CYSTOSCOPY;  Surgeon: Marilynne Rosaline SAILOR, MD;  Location: [facility];  Service: Gynecology;  Laterality: N/A;   DILATION AND CURETTAGE OF UTERUS     yrs ago per pt for missed ab   ESOPHAGOGASTRODUODENOSCOPY  10/2015   TOTAL ABDOMINAL HYSTERECTOMY  1991   per pt w/ USO,  unsure which side   Social History   Tobacco Use   Smoking status: Never   Smokeless tobacco: Never  Vaping Use   Vaping status: Never Used  Substance Use Topics   Alcohol use: No    Alcohol/week: 0.0 standard drinks of alcohol   Drug use: Never   Family History  Problem Relation Age of Onset   Stroke Mother    Hypertension Father    Stroke Father    Hyperlipidemia Sister    Hyperlipidemia Brother    Cancer Paternal Uncle    Cancer Maternal Grandmother        intestinal CA   Cancer Paternal Grandfather        CA unsure which kind   Hyperlipidemia Brother    Heart attack Sister        Thought to be silent MI   Breast cancer Neg Hx     Allergies  Allergen Reactions   Alendronate Sodium     REACTION: GI issues   Amoxicillin Nausea And Vomiting   Ivp Dye [Iodinated Contrast Media] Rash  Motrin [Ibuprofen] Rash    PER PT ONLY MOTRIN BRAND, THIS HAPPENED MANY YRS AGO.  CAN TAKE ADVIL BRAND ONLY    Current Outpatient Medications on File Prior to Visit  Medication Sig Dispense Refill   Calcium-Vitamin D -Vitamin K 650-12.5-40 MG-MCG-MCG CHEW Chew 2 tablets by mouth 2 (two) times daily.     Cholecalciferol (VITAMIN D ) 2000 UNITS CAPS Take 1 capsule by mouth daily.     vitamin B-12 (CYANOCOBALAMIN ) 500 MCG tablet Take 500 mcg by mouth daily.     No current facility-administered medications on file prior to visit.    Review of Systems  Constitutional:  Positive for fatigue. Negative for activity change, appetite change, fever and unexpected weight change.  HENT:  Negative for congestion, ear pain, rhinorrhea, sinus pressure and sore throat.   Eyes:  Negative for pain, redness and visual disturbance.  Respiratory:  Negative for cough, shortness of breath and wheezing.   Cardiovascular:  Negative for chest pain and palpitations.  Gastrointestinal:  Negative for abdominal pain, blood in stool, constipation and diarrhea.  Endocrine: Negative for polydipsia and polyuria.  Genitourinary:  Negative for dysuria, frequency and urgency.  Musculoskeletal:  Negative for arthralgias, back pain and myalgias.  Skin:  Negative for pallor and rash.  Allergic/Immunologic: Negative for environmental allergies.  Neurological:  Negative for dizziness, syncope and headaches.  Hematological:  Negative for adenopathy. Does not bruise/bleed easily.  Psychiatric/Behavioral:  Negative for decreased concentration and dysphoric mood. The patient is not nervous/anxious.        Objective:   Physical Exam Constitutional:      General: She is not in acute distress.    Appearance: Normal appearance. She is well-developed and normal weight. She is  not ill-appearing or diaphoretic.  HENT:     Head: Normocephalic and atraumatic.     Right Ear: Tympanic membrane, ear canal and external ear normal.     Left Ear: Tympanic membrane, ear canal and external ear normal.     Nose: Nose normal. No congestion.     Mouth/Throat:     Mouth: Mucous membranes are moist.     Pharynx: Oropharynx is clear. No posterior oropharyngeal erythema.  Eyes:     General: No scleral icterus.    Extraocular Movements: Extraocular movements intact.     Conjunctiva/sclera: Conjunctivae normal.     Pupils: Pupils are equal, round, and reactive to light.  Neck:     Thyroid : No thyromegaly.     Vascular: No carotid bruit or JVD.  Cardiovascular:     Rate and Rhythm: Normal rate and regular rhythm.     Pulses: Normal pulses.     Heart sounds: Normal heart sounds.     No gallop.  Pulmonary:     Effort: Pulmonary effort is normal. No respiratory distress.     Breath sounds: Normal breath sounds. No wheezing.     Comments: Good air exch Chest:     Chest wall: No tenderness.  Abdominal:     General: Bowel sounds are normal. There is no distension or abdominal bruit.     Palpations: Abdomen is soft. There is no mass.     Tenderness: There is no abdominal tenderness.     Hernia: No hernia is present.  Genitourinary:    Comments: Breast exam: No mass, nodules, thickening, tenderness, bulging, retraction, inflamation, nipple discharge or skin changes noted.  No axillary or clavicular LA.     Musculoskeletal:        General: No tenderness.  Normal range of motion.     Cervical back: Normal range of motion and neck supple. No rigidity. No muscular tenderness.     Right lower leg: No edema.     Left lower leg: No edema.     Comments: No kyphosis   Lymphadenopathy:     Cervical: No cervical adenopathy.  Skin:    General: Skin is warm and dry.     Coloration: Skin is not pale.     Findings: No erythema or rash.     Comments: Solar lentigines diffusely Some  scattered sks   Neurological:     Mental Status: She is alert. Mental status is at baseline.     Cranial Nerves: No cranial nerve deficit.     Motor: No abnormal muscle tone.     Coordination: Coordination normal.     Gait: Gait normal.     Deep Tendon Reflexes: Reflexes are normal and symmetric. Reflexes normal.  Psychiatric:        Mood and Affect: Mood normal.        Cognition and Memory: Cognition and memory normal.           Assessment & Plan:   Problem List Items Addressed This Visit       Nervous and Auditory   Chronic fatigue syndrome   Ongoing Waxes and wanes Encouraged exercise as tolerated and good self care  Thinking about using some weights/strength building exercise  She declines immunizations         Musculoskeletal and Integument   Osteoporosis   Dexa ordered Discussed fall prevention, supplements and exercise for bone density  One fall/no fractures   Evista  since 2021   (2nd course)       Relevant Medications   raloxifene  (EVISTA ) 60 MG tablet   Other Relevant Orders   DG Bone Density     Other   Vitamin D  deficiency   Last vitamin D  Lab Results  Component Value Date   VD25OH 42.89 05/06/2024   Continues current supplementation       Vitamin B12 deficiency   Lab Results  Component Value Date   VITAMINB12 480 05/06/2024   500 mcg daily      Routine general medical examination at a health care facility - Primary   Reviewed health habits including diet and exercise and skin cancer prevention Reviewed appropriate screening tests for age  Also reviewed health mt list, fam hx and immunization status , as well as social and family history   See HPI Labs reviewed and ordered Health Maintenance  Topic Date Due   Medicare Annual Wellness Visit  05/13/2024   Breast Cancer Screening  06/26/2024   Flu Shot  10/22/2024*   COVID-19 Vaccine (3 - 2025-26 season) 05/29/2025*   Zoster (Shingles) Vaccine (1 of 2) 08/13/2025*   DTaP/Tdap/Td  vaccine (4 - Tdap) 03/31/2027   Colon Cancer Screening  02/09/2032   Pneumococcal Vaccine for age over 62  Completed   DEXA scan (bone density measurement)  Completed   Hepatitis C Screening  Completed   Meningitis B Vaccine  Aged Out  *Topic was postponed. The date shown is not the original due date.    Declines imms Amw later today  Mammo and dexa ordered  Discussed fall prevention, supplements and exercise for bone density  Good mood        Hyperlipidemia   Disc goals for lipids and reasons to control them Rev last labs with pt Rev low sat fat diet  in detail Well controlled with simvastatin  40 mg daily and diet   LDL stable at 90       Relevant Medications   simvastatin  (ZOCOR ) 40 MG tablet   Encounter for screening mammogram for breast cancer   Mammogram ordered Pt will call to schedule       Relevant Orders   MM 3D SCREENING MAMMOGRAM BILATERAL BREAST

## 2024-05-13 NOTE — Assessment & Plan Note (Signed)
 Last vitamin D  Lab Results  Component Value Date   VD25OH 42.89 05/06/2024   Continues current supplementation

## 2024-05-13 NOTE — Assessment & Plan Note (Signed)
 Lab Results  Component Value Date   VITAMINB12 480 05/06/2024   500 mcg daily

## 2024-05-13 NOTE — Assessment & Plan Note (Signed)
 Dexa ordered Discussed fall prevention, supplements and exercise for bone density  One fall/no fractures   Evista  since 2021   (2nd course)

## 2024-05-13 NOTE — Assessment & Plan Note (Signed)
 Ongoing Waxes and wanes Encouraged exercise as tolerated and good self care  Thinking about using some weights/strength building exercise  She declines immunizations

## 2024-05-13 NOTE — Patient Instructions (Signed)
 Ms. Mcquade,  Thank you for taking the time for your Medicare Wellness Visit. I appreciate your continued commitment to your health goals. Please review the care plan we discussed, and feel free to reach out if I can assist you further.  Medicare recommends these wellness visits once per year to help you and your care team stay ahead of potential health issues. These visits are designed to focus on prevention, allowing your provider to concentrate on managing your acute and chronic conditions during your regular appointments.  Please note that Annual Wellness Visits do not include a physical exam. Some assessments may be limited, especially if the visit was conducted virtually. If needed, we may recommend a separate in-person follow-up with your provider.  Ongoing Care Seeing your primary care provider every 3 to 6 months helps us  monitor your health and provide consistent, personalized care.   Referrals If a referral was made during today's visit and you haven't received any updates within two weeks, please contact the referred provider directly to check on the status.  Recommended Screenings:  Health Maintenance  Topic Date Due   Breast Cancer Screening  06/26/2024   Flu Shot  10/22/2024*   COVID-19 Vaccine (3 - 2025-26 season) 05/29/2025*   Zoster (Shingles) Vaccine (1 of 2) 08/13/2025*   Medicare Annual Wellness Visit  05/13/2025   DTaP/Tdap/Td vaccine (4 - Tdap) 03/31/2027   Colon Cancer Screening  02/09/2032   Pneumococcal Vaccine for age over 64  Completed   DEXA scan (bone density measurement)  Completed   Hepatitis C Screening  Completed   Meningitis B Vaccine  Aged Out  *Topic was postponed. The date shown is not the original due date.       05/13/2024    1:59 PM  Advanced Directives  Does Patient Have a Medical Advance Directive? No  Would patient like information on creating a medical advance directive? No - Patient declined   Advance Care Planning is important  because it: Ensures you receive medical care that aligns with your values, goals, and preferences. Provides guidance to your family and loved ones, reducing the emotional burden of decision-making during critical moments.  Vision: Annual vision screenings are recommended for early detection of glaucoma, cataracts, and diabetic retinopathy. These exams can also reveal signs of chronic conditions such as diabetes and high blood pressure.  Dental: Annual dental screenings help detect early signs of oral cancer, gum disease, and other conditions linked to overall health, including heart disease and diabetes.  Please see the attached documents for additional preventive care recommendations.

## 2024-05-13 NOTE — Assessment & Plan Note (Signed)
 Reviewed health habits including diet and exercise and skin cancer prevention Reviewed appropriate screening tests for age  Also reviewed health mt list, fam hx and immunization status , as well as social and family history   See HPI Labs reviewed and ordered Health Maintenance  Topic Date Due   Medicare Annual Wellness Visit  05/13/2024   Breast Cancer Screening  06/26/2024   Flu Shot  10/22/2024*   COVID-19 Vaccine (3 - 2025-26 season) 05/29/2025*   Zoster (Shingles) Vaccine (1 of 2) 08/13/2025*   DTaP/Tdap/Td vaccine (4 - Tdap) 03/31/2027   Colon Cancer Screening  02/09/2032   Pneumococcal Vaccine for age over 45  Completed   DEXA scan (bone density measurement)  Completed   Hepatitis C Screening  Completed   Meningitis B Vaccine  Aged Out  *Topic was postponed. The date shown is not the original due date.    Declines imms Amw later today  Mammo and dexa ordered  Discussed fall prevention, supplements and exercise for bone density  Good mood

## 2024-05-13 NOTE — Progress Notes (Signed)
 Because this visit was a virtual/telehealth visit,  certain criteria was not obtained, such a blood pressure, CBG if applicable, and timed get up and go. Any medications not marked as taking were not mentioned during the medication reconciliation part of the visit. Any vitals not documented were not able to be obtained due to this being a telehealth visit or patient was unable to self-report a recent blood pressure reading due to a lack of equipment at home via telehealth. Vitals that have been documented are verbally provided by the patient.   This visit was performed by a medical professional under my direct supervision. I was immediately available for consultation/collaboration. I have reviewed and agree with the Annual Wellness Visit documentation.  Subjective:   Tracy [name] is a 73 y.o. who presents for a Medicare Wellness preventive visit.  As a reminder, Annual Wellness Visits don't include a physical exam, and some assessments may be limited, especially if this visit is performed virtually. We may recommend an in-person follow-up visit with your provider if needed.  Visit Complete: Virtual I connected with  Tracy [name] on 05/13/24 by a audio enabled telemedicine application and verified that I am speaking with the correct person using two identifiers.  Patient Location: Home  Provider Location: Home Office  I discussed the limitations of evaluation and management by telemedicine. The patient expressed understanding and agreed to proceed.  Vital Signs: Because this visit was a virtual/telehealth visit, some criteria may be missing or patient reported. Any vitals not documented were not able to be obtained and vitals that have been documented are patient reported.  VideoDeclined- This patient declined Librarian, academic. Therefore the visit was completed with audio only.  Persons Participating in Visit: Patient.  AWV Questionnaire: No: Patient  Medicare AWV questionnaire was not completed prior to this visit.  Cardiac Risk Factors include: advanced age (>54men, >54 women);dyslipidemia     Objective:    Today's Vitals   05/13/24 1359  BP: 128/62  Weight: 113 lb (51.3 kg)  Height: 5' 2 (1.575 m)   Body mass index is 20.67 kg/m.     05/13/2024    1:59 PM 05/11/2023    1:38 PM 05/09/2022    8:37 AM 06/17/2020   12:38 PM 03/24/2020   12:07 PM 03/18/2019    9:06 AM 03/08/2018    9:31 AM  Advanced Directives  Does Patient Have a Medical Advance Directive? No Yes No No No No No   Type of Furniture conservator/restorer;Living will       Does patient want to make changes to medical advance directive?     Yes (MAU/Ambulatory/Procedural Areas - Information given)    Copy of Healthcare Power of Attorney in Chart?  No - copy requested       Would patient like information on creating a medical advance directive? No - Patient declined  No - Patient declined No - Patient declined   No - Patient declined      Data saved with a previous flowsheet row definition    Current Medications (verified) Outpatient Encounter Medications as of 05/13/2024  Medication Sig   Calcium-Vitamin D -Vitamin K 650-12.5-40 MG-MCG-MCG CHEW Chew 2 tablets by mouth 2 (two) times daily.   Cholecalciferol (VITAMIN D ) 2000 UNITS CAPS Take 1 capsule by mouth daily.   raloxifene  (EVISTA ) 60 MG tablet Take 1 tablet (60 mg total) by mouth daily.   simvastatin  (ZOCOR ) 40 MG tablet TAKE (1) TABLET BY  MOUTH DAILY AT BEDTIME   vitamin B-12 (CYANOCOBALAMIN ) 500 MCG tablet Take 500 mcg by mouth daily.   No facility-administered encounter medications on file as of 05/13/2024.    Allergies (verified) Alendronate sodium, Amoxicillin, Ivp dye [iodinated contrast media], and Motrin [ibuprofen]   History: Past Medical History:  Diagnosis Date   Chronic fatigue syndrome    Frequency of urination    Hemorrhoids    Hiatal hernia    History of  gastroesophageal reflux (GERD)    w/ hx gastritis   Hyperlipidemia    Osteoporosis    Prolapse of anterior vaginal wall    Wears glasses    Past Surgical History:  Procedure Laterality Date   COLONOSCOPY  2015   CYSTOCELE REPAIR N/A 03/15/2021   Procedure: ANTERIOR REPAIR (CYSTOCELE);  Surgeon: Marilynne Rosaline SAILOR, MD;  Location: [facility];  Service: Gynecology;  Laterality: N/A;  total time requested 1.5hrs   CYSTOSCOPY N/A 03/15/2021   Procedure: CYSTOSCOPY;  Surgeon: Marilynne Rosaline SAILOR, MD;  Location: [facility];  Service: Gynecology;  Laterality: N/A;   DILATION AND CURETTAGE OF UTERUS     yrs ago per pt for missed ab   ESOPHAGOGASTRODUODENOSCOPY  10/2015   TOTAL ABDOMINAL HYSTERECTOMY  1991   per pt w/ USO,  unsure which side   Family History  Problem Relation Age of Onset   Stroke Mother    Hypertension Father    Stroke Father    Hyperlipidemia Sister    Hyperlipidemia Brother    Cancer Paternal Uncle    Cancer Maternal Grandmother        intestinal CA   Cancer Paternal Grandfather        CA unsure which kind   Hyperlipidemia Brother    Heart attack Sister        Thought to be silent MI   Breast cancer Neg Hx    Social History   Socioeconomic History   Marital status: Married    Spouse name: Not on file   Number of children: Not on file   Years of education: Not on file   Highest education level: Not on file  Occupational History   Not on file  Tobacco Use   Smoking status: Never   Smokeless tobacco: Never  Vaping Use   Vaping status: Never Used  Substance and Sexual Activity   Alcohol use: No    Alcohol/week: 0.0 standard drinks of alcohol   Drug use: Never   Sexual activity: Not on file  Other Topics Concern   Not on file  Social History Narrative   Not on file   Social Drivers of Health   Financial Resource Strain: Low Risk  (05/13/2024)   Overall Financial Resource Strain (CARDIA)    Difficulty of Paying  Living Expenses: Not hard at all  Food Insecurity: No Food Insecurity (05/13/2024)   Hunger Vital Sign    Worried About Running Out of Food in the Last Year: Never true    Ran Out of Food in the Last Year: Never true  Transportation Needs: No Transportation Needs (05/13/2024)   PRAPARE - Administrator, Civil Service (Medical): No    Lack of Transportation (Non-Medical): No  Physical Activity: Insufficiently Active (05/13/2024)   Exercise Vital Sign    Days of Exercise per Week: 3 days    Minutes of Exercise per Session: 30 min  Stress: No Stress Concern Present (05/13/2024)   [employer] of Occupational Health - Occupational  Stress Questionnaire    Feeling of Stress: Not at all  Social Connections: Moderately Integrated (05/13/2024)   Social Connection and Isolation Panel    Frequency of Communication with Friends and Family: More than three times a week    Frequency of Social Gatherings with Friends and Family: More than three times a week    Attends Religious Services: More than 4 times per year    Active Member of [employer] or Organizations: No    Attends Engineer, structural: Never    Marital Status: Married    Tobacco Counseling Counseling given: Not Answered    Clinical Intake:  Pre-visit preparation completed: Yes  Pain : No/denies pain     BMI - recorded: 20.67 Nutritional Status: BMI of 19-24  Normal Nutritional Risks: None Diabetes: No  No results found for: HGBA1C   How often do you need to have someone help you when you read instructions, pamphlets, or other written materials from your doctor or pharmacy?: 1 - Never  Interpreter Needed?: No      Activities of Daily Living     05/13/2024    1:52 PM  In your present state of health, do you have any difficulty performing the following activities:  Hearing? 0  Vision? 0  Difficulty concentrating or making decisions? 0  Walking or climbing stairs? 0  Dressing or bathing? 0   Doing errands, shopping? 0  Preparing Food and eating ? N  Using the Toilet? N  In the past six months, have you accidently leaked urine? N  Do you have problems with loss of bowel control? N  Managing your Medications? N  Managing your Finances? N  Housekeeping or managing your Housekeeping? N    Patient Care Team: Tower, Laine LABOR, MD as PCP - General  I have updated your Care Teams any recent Medical Services you may have received from other providers in the past year.     Assessment:   This is a routine wellness examination for Tracy [name].  Hearing/Vision screen Hearing Screening - Comments:: No difficulties  Vision Screening - Comments:: Patient wears readers   Goals Addressed               This Visit's Progress     Patient Stated (pt-stated)        Like to get some strength training        Depression Screen     05/13/2024    2:04 PM 05/11/2023    1:37 PM 05/09/2022    8:46 AM 05/06/2021    8:10 AM 04/01/2020   10:36 AM 03/24/2020   12:19 PM 03/18/2019    9:07 AM  PHQ 2/9 Scores  PHQ - 2 Score 0 0 0 0 0 0 0  PHQ- 9 Score 0  1  0 0 0    Fall Risk     05/13/2024    1:52 PM 05/11/2023    1:35 PM 05/09/2022    8:38 AM 05/06/2021    8:10 AM 04/01/2020   10:35 AM  Fall Risk   Falls in the past year? 1 0 0 0 1  Number falls in past yr: 0 0 0 0 1  Injury with Fall? 0 0 0 0 0  Risk for fall due to : History of fall(s) No Fall Risks     Follow up Falls evaluation completed Falls prevention discussed Falls evaluation completed;Education provided;Falls prevention discussed        Data saved with a previous flowsheet  row definition    MEDICARE RISK AT HOME:  Medicare Risk at Home Any stairs in or around the home?: (Patient-Rptd) Yes If so, are there any without handrails?: (Patient-Rptd) Yes Home free of loose throw rugs in walkways, pet beds, electrical cords, etc?: (Patient-Rptd) Yes Adequate lighting in your home to reduce risk of falls?: (Patient-Rptd)  Yes Life alert?: (Patient-Rptd) No Use of a cane, walker or w/c?: (Patient-Rptd) No Grab bars in the bathroom?: (Patient-Rptd) No Shower chair or bench in shower?: (Patient-Rptd) No Elevated toilet seat or a handicapped toilet?: (Patient-Rptd) No  TIMED UP AND GO:  Was the test performed?  no  Cognitive Function: 6CIT completed    03/24/2020   12:24 PM 03/18/2019    9:10 AM 03/08/2018    9:31 AM  MMSE - Mini Mental State Exam  Orientation to time 5 5 5   Orientation to Place 5 5 5   Registration 3 3 3   Attention/ Calculation 5 5 0  Recall 3 3 3   Language- name 2 objects  0 0  Language- repeat 1 0 1  Language- follow 3 step command  0 3  Language- read & follow direction  0 0  Write a sentence  0 0  Copy design  0 0  Total score  21 20        05/13/2024    2:05 PM 05/11/2023    1:39 PM 05/09/2022    8:39 AM  6CIT Screen  What Year? 0 points 0 points 0 points  What month? 0 points 0 points 0 points  What time? 0 points 0 points 0 points  Count back from 20 0 points 0 points 0 points  Months in reverse 0 points 0 points 0 points  Repeat phrase 0 points 0 points 0 points  Total Score 0 points 0 points 0 points    Immunizations Immunization History  Administered Date(s) Administered   Fluad Quad(high Dose 65+) 03/25/2019, 04/01/2020   INFLUENZA, HIGH DOSE SEASONAL PF 03/30/2017   Influenza,inj,Quad PF,6+ Mos 09/08/2016, 04/26/2018   Influenza-Unspecified 04/24/2013, 05/05/2014, 05/04/2015   PFIZER(Purple Top)SARS-COV-2 Vaccination 10/08/2019, 10/29/2019   Pneumococcal Conjugate-13 03/03/2017   Pneumococcal Polysaccharide-23 03/08/2018   Td 07/25/1996, 01/09/2007, 03/30/2017   Zoster, Live 02/14/2012    Screening Tests Health Maintenance  Topic Date Due   Mammogram  06/26/2024   Influenza Vaccine  10/22/2024 (Originally 02/23/2024)   COVID-19 Vaccine (3 - 2025-26 season) 05/29/2025 (Originally 03/25/2024)   Zoster Vaccines- Shingrix (1 of 2) 08/13/2025 (Originally  11/16/2001)   Medicare Annual Wellness (AWV)  05/13/2025   DTaP/Tdap/Td (4 - Tdap) 03/31/2027   Colonoscopy  02/09/2032   Pneumococcal Vaccine: 50+ Years  Completed   DEXA SCAN  Completed   Hepatitis C Screening  Completed   Meningococcal B Vaccine  Aged Out    Health Maintenance Items Addressed:patient declined   Additional Screening:  Vision Screening: Recommended annual ophthalmology exams for early detection of glaucoma and other disorders of the eye. Is the patient up to date with their annual eye exam?  Yes    Dental Screening: Recommended annual dental exams for proper oral hygiene  Community Resource Referral / Chronic Care Management: CRR required this visit?  No   CCM required this visit?  No   Plan:    I have personally reviewed and noted the following in the patient's chart:   Medical and social history Use of alcohol, tobacco or illicit drugs  Current medications and supplements including opioid prescriptions. Patient is not currently  taking opioid prescriptions. Functional ability and status Nutritional status Physical activity Advanced directives List of other physicians Hospitalizations, surgeries, and ER visits in previous 12 months Vitals Screenings to include cognitive, depression, and falls Referrals and appointments  In addition, I have reviewed and discussed with patient certain preventive protocols, quality metrics, and best practice recommendations. A written personalized care plan for preventive services as well as general preventive health recommendations were provided to patient.   Lyle MARLA Right, [place]   05/13/2024   After Visit Summary: (MyChart) Due to this being a telephonic visit, the after visit summary with patients personalized plan was offered to patient via MyChart   Notes: Nothing significant to report at this time.

## 2024-05-13 NOTE — Assessment & Plan Note (Signed)
 Mammogram ordered Pt will call to schedule

## 2024-07-02 ENCOUNTER — Ambulatory Visit
Admission: RE | Admit: 2024-07-02 | Discharge: 2024-07-02 | Disposition: A | Source: Ambulatory Visit | Attending: Family Medicine | Admitting: Family Medicine

## 2024-07-02 ENCOUNTER — Ambulatory Visit: Payer: Self-pay | Admitting: Family Medicine

## 2024-07-02 DIAGNOSIS — M810 Age-related osteoporosis without current pathological fracture: Secondary | ICD-10-CM

## 2024-07-02 DIAGNOSIS — Z1231 Encounter for screening mammogram for malignant neoplasm of breast: Secondary | ICD-10-CM

## 2025-05-07 ENCOUNTER — Other Ambulatory Visit

## 2025-05-14 ENCOUNTER — Encounter: Admitting: Family Medicine

## 2025-05-20 ENCOUNTER — Ambulatory Visit

## 2025-05-21 ENCOUNTER — Ambulatory Visit
